# Patient Record
Sex: Male | Born: 1965 | Race: Black or African American | Hispanic: No | Smoking: Former smoker
Health system: Southern US, Community
[De-identification: ages and names within clinical notes are randomized; demographics above are authoritative.]

## PROBLEM LIST (undated history)

## (undated) DIAGNOSIS — F1010 Alcohol abuse, uncomplicated: Secondary | ICD-10-CM

## (undated) DIAGNOSIS — R0602 Shortness of breath: Secondary | ICD-10-CM

## (undated) DIAGNOSIS — F1990 Other psychoactive substance use, unspecified, uncomplicated: Secondary | ICD-10-CM

## (undated) DIAGNOSIS — F419 Anxiety disorder, unspecified: Secondary | ICD-10-CM

## (undated) DIAGNOSIS — M549 Dorsalgia, unspecified: Secondary | ICD-10-CM

## (undated) DIAGNOSIS — A159 Respiratory tuberculosis unspecified: Secondary | ICD-10-CM

## (undated) DIAGNOSIS — R51 Headache: Secondary | ICD-10-CM

## (undated) DIAGNOSIS — E785 Hyperlipidemia, unspecified: Secondary | ICD-10-CM

## (undated) DIAGNOSIS — T8859XA Other complications of anesthesia, initial encounter: Secondary | ICD-10-CM

## (undated) DIAGNOSIS — E7800 Pure hypercholesterolemia, unspecified: Secondary | ICD-10-CM

## (undated) DIAGNOSIS — S86019A Strain of unspecified Achilles tendon, initial encounter: Secondary | ICD-10-CM

## (undated) DIAGNOSIS — T4145XA Adverse effect of unspecified anesthetic, initial encounter: Secondary | ICD-10-CM

## (undated) HISTORY — PX: FRACTURE SURGERY: SHX138

## (undated) HISTORY — DX: Alcohol abuse, uncomplicated: F10.10

## (undated) HISTORY — DX: Pure hypercholesterolemia, unspecified: E78.00

## (undated) HISTORY — DX: Shortness of breath: R06.02

## (undated) HISTORY — DX: Hyperlipidemia, unspecified: E78.5

## (undated) HISTORY — DX: Other psychoactive substance use, unspecified, uncomplicated: F19.90

## (undated) HISTORY — DX: Dorsalgia, unspecified: M54.9

## (undated) HISTORY — DX: Anxiety disorder, unspecified: F41.9

## (undated) HISTORY — DX: Strain of unspecified achilles tendon, initial encounter: S86.019A

---

## 1998-03-16 ENCOUNTER — Encounter: Payer: Self-pay | Admitting: Internal Medicine

## 2005-08-28 ENCOUNTER — Encounter: Payer: Self-pay | Admitting: Internal Medicine

## 2007-01-16 ENCOUNTER — Ambulatory Visit: Payer: Self-pay | Admitting: Internal Medicine

## 2007-01-16 ENCOUNTER — Encounter: Payer: Self-pay | Admitting: Internal Medicine

## 2007-01-16 DIAGNOSIS — F411 Generalized anxiety disorder: Secondary | ICD-10-CM

## 2007-01-16 DIAGNOSIS — E785 Hyperlipidemia, unspecified: Secondary | ICD-10-CM | POA: Insufficient documentation

## 2007-01-16 DIAGNOSIS — A6000 Herpesviral infection of urogenital system, unspecified: Secondary | ICD-10-CM | POA: Insufficient documentation

## 2007-01-16 DIAGNOSIS — F3289 Other specified depressive episodes: Secondary | ICD-10-CM | POA: Insufficient documentation

## 2007-01-16 DIAGNOSIS — J309 Allergic rhinitis, unspecified: Secondary | ICD-10-CM | POA: Insufficient documentation

## 2007-01-16 DIAGNOSIS — F329 Major depressive disorder, single episode, unspecified: Secondary | ICD-10-CM

## 2007-01-16 LAB — CONVERTED CEMR LAB
ALT: 34 units/L (ref 0–53)
AST: 30 units/L (ref 0–37)
Basophils Relative: 1.6 % — ABNORMAL HIGH (ref 0.0–1.0)
Bilirubin, Direct: 0.1 mg/dL (ref 0.0–0.3)
CO2: 35 meq/L — ABNORMAL HIGH (ref 19–32)
Direct LDL: 154.5 mg/dL
Eosinophils Absolute: 0.2 10*3/uL (ref 0.0–0.6)
Eosinophils Relative: 4.1 % (ref 0.0–5.0)
GFR calc Af Amer: 94 mL/min
GFR calc non Af Amer: 78 mL/min
Glucose, Bld: 86 mg/dL (ref 70–99)
HCT: 43.1 % (ref 39.0–52.0)
Hemoglobin: 14.6 g/dL (ref 13.0–17.0)
Leukocytes, UA: NEGATIVE
Lymphocytes Relative: 50.9 % — ABNORMAL HIGH (ref 12.0–46.0)
MCV: 89.2 fL (ref 78.0–100.0)
Monocytes Absolute: 0.4 10*3/uL (ref 0.2–0.7)
Neutro Abs: 2.2 10*3/uL (ref 1.4–7.7)
Neutrophils Relative %: 37.4 % — ABNORMAL LOW (ref 43.0–77.0)
Nitrite: NEGATIVE
Sodium: 144 meq/L (ref 135–145)
Specific Gravity, Urine: 1.015 (ref 1.000–1.03)
Total Protein: 7 g/dL (ref 6.0–8.3)
Triglycerides: 113 mg/dL (ref 0–149)
Urine Glucose: NEGATIVE mg/dL
Urobilinogen, UA: 0.2 (ref 0.0–1.0)
WBC: 6 10*3/uL (ref 4.5–10.5)

## 2008-11-03 ENCOUNTER — Ambulatory Visit: Payer: Self-pay | Admitting: Internal Medicine

## 2008-11-03 ENCOUNTER — Ambulatory Visit (HOSPITAL_BASED_OUTPATIENT_CLINIC_OR_DEPARTMENT_OTHER): Admission: RE | Admit: 2008-11-03 | Discharge: 2008-11-03 | Payer: Self-pay | Admitting: Internal Medicine

## 2008-11-03 ENCOUNTER — Ambulatory Visit: Payer: Self-pay | Admitting: Radiology

## 2008-11-03 DIAGNOSIS — M545 Low back pain: Secondary | ICD-10-CM

## 2008-11-09 ENCOUNTER — Telehealth: Payer: Self-pay | Admitting: Internal Medicine

## 2008-11-15 ENCOUNTER — Encounter: Admission: RE | Admit: 2008-11-15 | Discharge: 2009-02-13 | Payer: Self-pay | Admitting: Internal Medicine

## 2008-11-15 ENCOUNTER — Ambulatory Visit: Payer: Self-pay | Admitting: Internal Medicine

## 2008-11-15 LAB — CONVERTED CEMR LAB
ALT: 15 units/L (ref 0–53)
AST: 15 units/L (ref 0–37)
Albumin: 4.2 g/dL (ref 3.5–5.2)
Alkaline Phosphatase: 48 units/L (ref 39–117)
Basophils Relative: 2 % — ABNORMAL HIGH (ref 0–1)
CO2: 29 meq/L (ref 19–32)
Calcium: 9.9 mg/dL (ref 8.4–10.5)
Cholesterol: 265 mg/dL — ABNORMAL HIGH (ref 0–200)
Creatinine, Ser: 1.04 mg/dL (ref 0.40–1.50)
Eosinophils Absolute: 0.1 10*3/uL (ref 0.0–0.7)
HDL: 61 mg/dL (ref >39)
MCHC: 34.4 g/dL (ref 30.0–36.0)
MCV: 85.9 fL (ref 78.0–100.0)
Monocytes Relative: 9 % (ref 3–12)
Neutro Abs: 1.8 10*3/uL (ref 1.7–7.7)
Neutrophils Relative %: 37 % — ABNORMAL LOW (ref 43–77)
Platelets: 188 10*3/uL (ref 150–400)
RBC: 4.97 M/uL (ref 4.22–5.81)
Sodium: 140 meq/L (ref 135–145)
TSH: 0.893 microintl units/mL (ref 0.350–4.500)
Total CHOL/HDL Ratio: 4.3
Total Protein: 7.3 g/dL (ref 6.0–8.3)
Triglycerides: 78 mg/dL (ref <150)
WBC: 4.8 10*3/uL (ref 4.0–10.5)

## 2008-11-16 ENCOUNTER — Encounter: Payer: Self-pay | Admitting: Internal Medicine

## 2008-12-27 ENCOUNTER — Telehealth: Payer: Self-pay | Admitting: Internal Medicine

## 2009-01-31 ENCOUNTER — Ambulatory Visit: Payer: Self-pay | Admitting: Internal Medicine

## 2009-01-31 LAB — CONVERTED CEMR LAB
ALT: 18 units/L (ref 0–53)
AST: 19 units/L (ref 0–37)
LDL Cholesterol: 171 mg/dL — ABNORMAL HIGH (ref 0–99)
Triglycerides: 100 mg/dL (ref <150)
VLDL: 20 mg/dL (ref 0–40)

## 2009-02-07 ENCOUNTER — Ambulatory Visit: Payer: Self-pay | Admitting: Internal Medicine

## 2009-08-04 ENCOUNTER — Ambulatory Visit: Payer: Self-pay | Admitting: Internal Medicine

## 2009-08-04 LAB — CONVERTED CEMR LAB
ALT: 21 units/L (ref 0–53)
BUN: 14 mg/dL (ref 6–23)
Bilirubin, Direct: 0.1 mg/dL (ref 0.0–0.3)
CO2: 32 meq/L (ref 19–32)
Cholesterol: 228 mg/dL — ABNORMAL HIGH (ref 0–200)
Creatinine, Ser: 1 mg/dL (ref 0.4–1.5)
Direct LDL: 140.6 mg/dL
GFR calc non Af Amer: 107.66 mL/min (ref >60)
HDL: 72.6 mg/dL (ref >39.00)
Total Bilirubin: 0.5 mg/dL (ref 0.3–1.2)
Total CHOL/HDL Ratio: 3
Triglycerides: 101 mg/dL (ref 0.0–149.0)

## 2009-08-17 ENCOUNTER — Ambulatory Visit: Payer: Self-pay | Admitting: Internal Medicine

## 2010-05-01 NOTE — Assessment & Plan Note (Signed)
Summary: CPX- jr rsc with pt/mhf   Vital Signs:  Patient profile:   45 year old male Height:      73 inches Weight:      203 pounds BMI:     26.88 O2 Sat:      99 % on Room air Temp:     98.2 degrees F oral Pulse rate:   55 / minute Pulse rhythm:   regular Resp:     18 per minute BP sitting:   110 / 70  (left arm) Cuff size:   large  Vitals Entered By: Glendell Docker CMA (Aug 17, 2009 10:20 AM)  O2 Flow:  Room air CC: Rm 2- 6 Month Follow up disease management Is Patient Diabetic? No   Primary Care Provider:  DThomos Lemons DO  CC:  Rm 2- 6 Month Follow up disease management.  History of Present Illness: 45 year old American male for routine physical no significant interval history Patient taking pravastatin but intermittently forgets to take in evening he denies side effects from taking statin He is following reasonable low saturated fat diet weight is stable Still working as Chiropractor. professor of dance   Preventive Screening-Counseling & Management  Alcohol-Tobacco     Smoking Status: quit  Allergies (verified): No Known Drug Allergies  Past History:  Past Medical History: genital herpes Allergic rhinitis Hyperlipidemia Anxiety  Depression Hx of + PPD - tx'ed with INH  Past Surgical History: arm surg with PIN 1976   Family History: Family History Hypertension Family History Lung cancer - mother Family History of Stroke M 1st degree relative <50 Family History of CAD Male 1st degree relative <50 AAA - father Father died age 57 Mother died age 36     Social History: Occupation: Professor Corporate treasurer Lives with domestic partner 2 years Former smoker Alcohol use-yes   Review of Systems  The patient denies fever, weight loss, weight gain, chest pain, dyspnea on exertion, prolonged cough, abdominal pain, melena, hematochezia, severe indigestion/heartburn, and depression.    Physical Exam  General:  alert, well-developed, and well-nourished.     Head:  normocephalic and atraumatic.   Eyes:  pupils equal, pupils round, and pupils reactive to light.   Ears:  R ear normal and L ear normal.   Mouth:  Oral mucosa and oropharynx without lesions or exudates.  Teeth in good repair. Neck:  No deformities, masses, or tenderness noted. no carotid bruit Lungs:  normal respiratory effort, normal breath sounds, no crackles, and no wheezes.   Heart:  normal rate, regular rhythm, no murmur, and no gallop.   Abdomen:  soft, non-tender, normal bowel sounds, no masses, no hepatomegaly, and no splenomegaly.   Pulses:  dorsalis pedis and posterior tibial pulses are full and equal bilaterally Extremities:  No lower extremity edema Neurologic:  cranial nerves II-XII intact and gait normal.   Psych:  normally interactive, good eye contact, not anxious appearing, and not depressed appearing.     Impression & Recommendations:  Problem # 1:  PREVENTIVE HEALTH CARE (ICD-V70.0) We discussed low saturated fat diet Patient to work towards 10-15 pound weight loss  Flu Vax: Fluvax Non-MCR (02/07/2009)   Chol: 228 (08/04/2009)   HDL: 72.60 (08/04/2009)   LDL: 171 (01/31/2009)   TG: 101.0 (08/04/2009) TSH: 0.893 (11/15/2008)   HgbA1C: 5.9 (08/04/2009)     Complete Medication List: 1)  Pravastatin Sodium 40 Mg Tabs (Pravastatin sodium) .... One by mouth qpm 2)  Multivitamins Tabs (Multiple vitamin) .... Take 1 tablet  by mouth once a day 3)  Saw Palmetto Xtra Caps (Misc natural products) .... Take 1 capsule by mouth two times a day  Patient Instructions: 1)  Please schedule a follow-up appointment in 1 year. 2)  AST, ALT, FLP:  272.4 3)  Please return for lab work one (1) week before your next appointment.  Prescriptions: PRAVASTATIN SODIUM 40 MG TABS (PRAVASTATIN SODIUM) one by mouth qpm  #90 x 3   Entered and Authorized by:   D. Thomos Lemons DO   Signed by:   D. Thomos Lemons DO on 08/17/2009   Method used:   Electronically to        [facility]. [address]  [phone]* (retail)       [address]       [place], [place]  [address]       Ph: [phone] or [phone]       Fax: [phone]   RxID:   [id number]   Current Allergies (reviewed today): No known allergies

## 2012-04-01 DIAGNOSIS — S86019A Strain of unspecified Achilles tendon, initial encounter: Secondary | ICD-10-CM

## 2012-04-01 HISTORY — DX: Strain of unspecified achilles tendon, initial encounter: S86.019A

## 2013-02-20 ENCOUNTER — Emergency Department (HOSPITAL_COMMUNITY)
Admission: EM | Admit: 2013-02-20 | Discharge: 2013-02-20 | Disposition: A | Discharge status: Home / Self Care | Payer: BC Managed Care – PPO | Attending: Emergency Medicine | Admitting: Emergency Medicine

## 2013-02-20 ENCOUNTER — Encounter (HOSPITAL_COMMUNITY): Payer: Self-pay | Admitting: Emergency Medicine

## 2013-02-20 DIAGNOSIS — X500XXA Overexertion from strenuous movement or load, initial encounter: Secondary | ICD-10-CM | POA: Insufficient documentation

## 2013-02-20 DIAGNOSIS — F17200 Nicotine dependence, unspecified, uncomplicated: Secondary | ICD-10-CM | POA: Insufficient documentation

## 2013-02-20 DIAGNOSIS — S93499A Sprain of other ligament of unspecified ankle, initial encounter: Secondary | ICD-10-CM | POA: Insufficient documentation

## 2013-02-20 DIAGNOSIS — S86012A Strain of left Achilles tendon, initial encounter: Secondary | ICD-10-CM

## 2013-02-20 DIAGNOSIS — Y9341 Activity, dancing: Secondary | ICD-10-CM | POA: Insufficient documentation

## 2013-02-20 DIAGNOSIS — Y929 Unspecified place or not applicable: Secondary | ICD-10-CM | POA: Insufficient documentation

## 2013-02-20 MED ORDER — HYDROCODONE-ACETAMINOPHEN 5-325 MG PO TABS: 1.0000 | ORAL_TABLET | Freq: Four times a day (QID) | ORAL | Status: DC | PRN

## 2013-02-20 NOTE — ED Provider Notes (Signed)
CSN: [id number]     Arrival date & time 02/20/13  1402 History   First MD Initiated Contact with Patient 02/20/13 1419      This chart was scribed for non-physician practitioner, Jaynie Crumble, PA-C, working with Shon Baton, MD by Arlan Organ, ED Scribe. This patient was seen in room WTR9/WTR9 and the patient's care was started at 2:37 PM.   Chief Complaint  Patient presents with  . Ankle Pain    left    The history is provided by the patient. No language interpreter was used.   HPI Comments: Ray Case is a 47 y.o. male who presents to the Emergency Department complaining of sudden onset, unchanged, constant left posterior ankle pain that started about 1 hour prior to arrival. 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 He describes the pain as "dull" and "achy", and states movement does not worsen the pain. Pt denies fever.  History reviewed. No pertinent past medical history. History reviewed. No pertinent past surgical history. No family history on file. History  Substance Use Topics  . Smoking status: Current Every Day Smoker    Types: Cigarettes  . Smokeless tobacco: Never Used  . Alcohol Use: 2.4 oz/week    2 Glasses of wine, 2 Shots of liquor per week     Comment: daily wine and occasional iquor    Review of Systems  Constitutional: Negative for fever.  Musculoskeletal: Positive for arthralgias (ankle pain).  All other systems reviewed and are negative.    Allergies  Review of patient's allergies indicates no known allergies.  Home Medications  No current outpatient prescriptions on file.  Triage Vitals: BP 122/80  Pulse 88  Temp(Src) 98.3 F (36.8 C) (Oral)  Resp 17  SpO2 98%  Physical Exam  Nursing note and vitals reviewed. Constitutional: He is oriented to person, place, and time. He appears well-developed and well-nourished.  HENT:  Head: Normocephalic and  atraumatic.  Eyes: EOM are normal.  Neck: Normal range of motion.  Cardiovascular: Normal rate.   Pulmonary/Chest: Effort normal.  Musculoskeletal: Normal range of motion.  Left achilles deformity. Positive thompson's test. Pt is able to dorsiflex and plantar flex foot. Normal foot exam. No tenderness over bilateral malleoli. No foot bony tenderness.   Neurological: He is alert and oriented to person, place, and time.  Skin: Skin is warm and dry.  Psychiatric: He has a normal mood and affect. His behavior is normal.    ED Course  Procedures (including critical care time)  DIAGNOSTIC STUDIES: Oxygen Saturation is 98% on RA, Normal by my interpretation.    COORDINATION OF CARE: 2:38 PM- Will order splint placement. Discussed treatment plan with pt at bedside and pt agreed to plan.     Labs Review Labs Reviewed - No data to display Imaging Review No results found.  EKG Interpretation   None       MDM   1. Ruptured Achilles tendon due to trauma, left, initial encounter     Pt with clinical rupture of the left Achilles tendon. No bony tenderness or other findings on the exam. Do not think any imaging is necessary on an emergent basis. Patient's foot was splinted in posterior splint by an orthopedics tech and he was provided with crutches. He will need to followup with an orthopedic specialist for further treatment and possible surgical repair.  Filed Vitals:   02/20/13 1412  02/20/13 1525  BP: 122/80 127/81  Pulse: 88   Temp: 98.3 F (36.8 C)   TempSrc: Oral   Resp: 17   SpO2: 98%      I personally performed the services described in this documentation, which was scribed in my presence. The recorded information has been reviewed and is accurate.    Lottie Mussel, PA-C 02/20/13 1612  Lottie Mussel, PA-C 02/20/13 1613

## 2013-02-20 NOTE — ED Notes (Signed)
Pt states he was doing a dance move @ 1 hour PTA and he heard a pop in his left posterior ankle.  Pt states, "It felt like someone kicked me."  Pt states he now has a dull ache in left posterior ankle.  Pt is able to dorsiflex and point his foot.  Pt states movement does not make pain worse.  Pt states left foot feels "wobbly."  Pt's foot is warm and dry.

## 2013-02-20 NOTE — ED Provider Notes (Signed)
Medical screening examination/treatment/procedure(s) were performed by non-physician practitioner and as supervising physician I was immediately available for consultation/collaboration.  EKG Interpretation   None         Shon Baton, MD 02/20/13 [phone]

## 2013-02-22 ENCOUNTER — Other Ambulatory Visit (HOSPITAL_COMMUNITY): Payer: Self-pay | Admitting: Orthopaedic Surgery

## 2013-02-23 ENCOUNTER — Telehealth: Payer: Self-pay | Admitting: Internal Medicine

## 2013-02-23 ENCOUNTER — Encounter (HOSPITAL_COMMUNITY): Payer: Self-pay | Admitting: *Deleted

## 2013-02-23 MED ORDER — CEFAZOLIN SODIUM-DEXTROSE 2-3 GM-% IV SOLR
2.0000 g | INTRAVENOUS | Status: AC
  Administered 2013-02-24: 2 g via INTRAVENOUS
  Filled 2013-02-23: qty 50

## 2013-02-23 NOTE — Telephone Encounter (Signed)
Pt has not seen MD in over 3 yrs. Pt would like to re-est  Can I sch?

## 2013-02-23 NOTE — Telephone Encounter (Signed)
Ok per Dr Yoo 

## 2013-02-23 NOTE — Telephone Encounter (Signed)
PT HAS BEEN SCH

## 2013-02-24 ENCOUNTER — Ambulatory Visit (HOSPITAL_COMMUNITY)
Admission: RE | Admit: 2013-02-24 | Discharge: 2013-02-24 | Disposition: A | Discharge status: Home / Self Care | Payer: BC Managed Care – PPO | Source: Ambulatory Visit | Attending: Orthopaedic Surgery | Admitting: Orthopaedic Surgery

## 2013-02-24 ENCOUNTER — Encounter (HOSPITAL_COMMUNITY): Payer: BC Managed Care – PPO | Admitting: Certified Registered Nurse Anesthetist

## 2013-02-24 ENCOUNTER — Encounter (HOSPITAL_COMMUNITY)
Admission: RE | Disposition: A | Discharge status: Home / Self Care | Payer: Self-pay | Source: Ambulatory Visit | Attending: Orthopaedic Surgery

## 2013-02-24 ENCOUNTER — Ambulatory Visit (HOSPITAL_COMMUNITY): Payer: BC Managed Care – PPO | Admitting: Certified Registered Nurse Anesthetist

## 2013-02-24 ENCOUNTER — Encounter (HOSPITAL_COMMUNITY): Payer: Self-pay | Admitting: *Deleted

## 2013-02-24 DIAGNOSIS — I10 Essential (primary) hypertension: Secondary | ICD-10-CM | POA: Insufficient documentation

## 2013-02-24 DIAGNOSIS — X58XXXA Exposure to other specified factors, initial encounter: Secondary | ICD-10-CM | POA: Insufficient documentation

## 2013-02-24 DIAGNOSIS — S93499A Sprain of other ligament of unspecified ankle, initial encounter: Secondary | ICD-10-CM | POA: Insufficient documentation

## 2013-02-24 DIAGNOSIS — S86019A Strain of unspecified Achilles tendon, initial encounter: Secondary | ICD-10-CM

## 2013-02-24 DIAGNOSIS — Y9341 Activity, dancing: Secondary | ICD-10-CM | POA: Insufficient documentation

## 2013-02-24 DIAGNOSIS — S86012A Strain of left Achilles tendon, initial encounter: Secondary | ICD-10-CM

## 2013-02-24 DIAGNOSIS — F17200 Nicotine dependence, unspecified, uncomplicated: Secondary | ICD-10-CM | POA: Insufficient documentation

## 2013-02-24 HISTORY — DX: Other complications of anesthesia, initial encounter: T88.59XA

## 2013-02-24 HISTORY — DX: Respiratory tuberculosis unspecified: A15.9

## 2013-02-24 HISTORY — DX: Headache: R51

## 2013-02-24 HISTORY — PX: ACHILLES TENDON SURGERY: SHX542

## 2013-02-24 HISTORY — DX: Adverse effect of unspecified anesthetic, initial encounter: T41.45XA

## 2013-02-24 LAB — CBC
HCT: 43.9 % (ref 39.0–52.0)
MCHC: 34.4 g/dL (ref 30.0–36.0)
MCV: 89.8 fL (ref 78.0–100.0)
Platelets: 175 10*3/uL (ref 150–400)
RDW: 13.8 % (ref 11.5–15.5)

## 2013-02-24 SURGERY — REPAIR, TENDON, ACHILLES
Anesthesia: General | Site: Ankle | Laterality: Left | Wound class: Clean

## 2013-02-24 MED ORDER — OXYCODONE HCL 5 MG PO TABS: 5.0000 mg | ORAL_TABLET | ORAL | Status: DC | PRN

## 2013-02-24 MED ORDER — FENTANYL CITRATE 0.05 MG/ML IJ SOLN
INTRAMUSCULAR | Status: AC
  Administered 2013-02-24: 100 ug
  Filled 2013-02-24: qty 2

## 2013-02-24 MED ORDER — LIDOCAINE HCL 4 % MT SOLN
OROMUCOSAL | Status: DC | PRN
  Administered 2013-02-24: 4 mL via TOPICAL

## 2013-02-24 MED ORDER — ONDANSETRON HCL 4 MG/2ML IJ SOLN
INTRAMUSCULAR | Status: DC | PRN
  Administered 2013-02-24: 4 mg via INTRAVENOUS

## 2013-02-24 MED ORDER — GLYCOPYRROLATE 0.2 MG/ML IJ SOLN
INTRAMUSCULAR | Status: DC | PRN
  Administered 2013-02-24: .4 mg via INTRAVENOUS
  Administered 2013-02-24: .2 mg via INTRAVENOUS

## 2013-02-24 MED ORDER — NEOSTIGMINE METHYLSULFATE 1 MG/ML IJ SOLN
INTRAMUSCULAR | Status: DC | PRN
  Administered 2013-02-24: 3 mg via INTRAVENOUS

## 2013-02-24 MED ORDER — 0.9 % SODIUM CHLORIDE (POUR BTL) OPTIME
TOPICAL | Status: DC | PRN
  Administered 2013-02-24: 1000 mL

## 2013-02-24 MED ORDER — FENTANYL CITRATE 0.05 MG/ML IJ SOLN
INTRAMUSCULAR | Status: DC | PRN
  Administered 2013-02-24: 50 ug via INTRAVENOUS
  Administered 2013-02-24 (×2): 100 ug via INTRAVENOUS

## 2013-02-24 MED ORDER — ASPIRIN EC 325 MG PO TBEC: 325.0000 mg | DELAYED_RELEASE_TABLET | Freq: Every day | ORAL | Status: DC

## 2013-02-24 MED ORDER — HYDROMORPHONE HCL PF 1 MG/ML IJ SOLN
0.2500 mg | INTRAMUSCULAR | Status: DC | PRN
Start: 2013-02-24 — End: 2013-02-24

## 2013-02-24 MED ORDER — SUCCINYLCHOLINE CHLORIDE 20 MG/ML IJ SOLN
INTRAMUSCULAR | Status: DC | PRN
  Administered 2013-02-24: 100 mg via INTRAVENOUS

## 2013-02-24 MED ORDER — ARTIFICIAL TEARS OP OINT
TOPICAL_OINTMENT | OPHTHALMIC | Status: DC | PRN
  Administered 2013-02-24: 1 via OPHTHALMIC

## 2013-02-24 MED ORDER — LACTATED RINGERS IV SOLN
INTRAVENOUS | Status: DC | PRN
  Administered 2013-02-24 (×2): via INTRAVENOUS

## 2013-02-24 MED ORDER — LACTATED RINGERS IV SOLN
INTRAVENOUS | Status: DC
  Administered 2013-02-24: 09:00:00 via INTRAVENOUS

## 2013-02-24 MED ORDER — ROCURONIUM BROMIDE 100 MG/10ML IV SOLN
INTRAVENOUS | Status: DC | PRN
  Administered 2013-02-24: 35 mg via INTRAVENOUS

## 2013-02-24 MED ORDER — ONDANSETRON HCL 4 MG/2ML IJ SOLN: 4.0000 mg | Freq: Once | INTRAMUSCULAR | Status: DC | PRN

## 2013-02-24 MED ORDER — PROPOFOL 10 MG/ML IV BOLUS
INTRAVENOUS | Status: DC | PRN
  Administered 2013-02-24: 200 mg via INTRAVENOUS

## 2013-02-24 SURGICAL SUPPLY — 40 items
BLADE SURG 10 STRL SS (BLADE) IMPLANT
BNDG COHESIVE 6X5 TAN STRL LF (GAUZE/BANDAGES/DRESSINGS) IMPLANT
BNDG ESMARK 4X9 LF (GAUZE/BANDAGES/DRESSINGS) ×2 IMPLANT
BNDG GAUZE STRTCH 6 (GAUZE/BANDAGES/DRESSINGS) ×2 IMPLANT
CLOTH BEACON ORANGE TIMEOUT ST (SAFETY) IMPLANT
COTTON STERILE ROLL (GAUZE/BANDAGES/DRESSINGS) IMPLANT
CUFF TOURNIQUET SINGLE 34IN LL (TOURNIQUET CUFF) ×2 IMPLANT
CUFF TOURNIQUET SINGLE 44IN (TOURNIQUET CUFF) IMPLANT
DRAPE INCISE IOBAN 66X45 STRL (DRAPES) IMPLANT
DRAPE U-SHAPE 47X51 STRL (DRAPES) ×2 IMPLANT
DRSG ADAPTIC 3X8 NADH LF (GAUZE/BANDAGES/DRESSINGS) ×2 IMPLANT
DURAPREP 26ML APPLICATOR (WOUND CARE) ×2 IMPLANT
ELECT REM PT RETURN 9FT ADLT (ELECTROSURGICAL) ×2
ELECTRODE REM PT RTRN 9FT ADLT (ELECTROSURGICAL) ×1 IMPLANT
GOWN PREVENTION PLUS XLARGE (GOWN DISPOSABLE) ×2 IMPLANT
GOWN SRG XL XLNG 56XLVL 4 (GOWN DISPOSABLE) ×2 IMPLANT
GOWN STRL NON-REIN XL XLG LVL4 (GOWN DISPOSABLE) ×2
KIT IMPLANT SUTURE PARS (Kit) ×2 IMPLANT
KIT ROOM TURNOVER OR (KITS) ×2 IMPLANT
MANIFOLD NEPTUNE II (INSTRUMENTS) ×2 IMPLANT
NDL SUT .5 MAYO 1.404X.05X (NEEDLE) ×1 IMPLANT
NEEDLE MAYO TAPER (NEEDLE) ×1
NS IRRIG 1000ML POUR BTL (IV SOLUTION) ×2 IMPLANT
PACK ORTHO EXTREMITY (CUSTOM PROCEDURE TRAY) ×2 IMPLANT
PAD ARMBOARD 7.5X6 YLW CONV (MISCELLANEOUS) ×4 IMPLANT
SPLINT FIBERGLASS 4X30 (CAST SUPPLIES) ×2 IMPLANT
SPONGE GAUZE 4X4 12PLY (GAUZE/BANDAGES/DRESSINGS) ×2 IMPLANT
SPONGE LAP 4X18 X RAY DECT (DISPOSABLE) ×2 IMPLANT
SUT ETHILON 3 0 FSLX (SUTURE) IMPLANT
SUT FIBERWIRE #2 38 T-5 BLUE (SUTURE)
SUT MNCRL AB 3-0 PS2 18 (SUTURE) IMPLANT
SUT MNCRL AB 4-0 PS2 18 (SUTURE) ×2 IMPLANT
SUT VIC AB 2-0 CT1 27 (SUTURE) ×1
SUT VIC AB 2-0 CT1 TAPERPNT 27 (SUTURE) ×1 IMPLANT
SUTURE FIBERWR #2 38 T-5 BLUE (SUTURE) IMPLANT
TOWEL OR 17X24 6PK STRL BLUE (TOWEL DISPOSABLE) ×2 IMPLANT
TOWEL OR 17X26 10 PK STRL BLUE (TOWEL DISPOSABLE) ×2 IMPLANT
TUBE CONNECTING 12X1/4 (SUCTIONS) ×2 IMPLANT
WATER STERILE IRR 1000ML POUR (IV SOLUTION) ×2 IMPLANT
YANKAUER SUCT BULB TIP NO VENT (SUCTIONS) ×2 IMPLANT

## 2013-02-24 NOTE — Op Note (Signed)
Date of surgery: 02/24/2013  Preoperative diagnosis: Left Achilles tendon rupture  Postoperative diagnosis: Same  Procedure: Mini open repair left Achilles tendon rupture  Surgeon: Glee Arvin, MD  Anesthesia: Gen.  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  He presented to my clinic today complete rupture of his left Achilles tendon. The risks, benefits, and alternatives to surgery were discussed and the patient agreed to proceed with her and  Description of procedure: The patient was identified in the preoperative holding area. The operative site and procedure were confirmed with the patient and marked by the surgeon. He is brought back to the operating room. General anesthesia was induced by the anesthesiologist. He was placed prone on the operating room table. A well-padded nonsterile tourniquet was placed on the upper thigh. The left lower extremity was prepped and draped in standard sterile fashion. Incisional antibiotics were given prior to incision. A timeout was performed. The extremity was exsanguinated with the use of Esmarch bandage. The tourniquet was inflated to 300 mm Hg.  A 2 cm longitudinal incision based over the possible defect in the Achilles tendon was used. Blunt dissection was taken down to the level of the peritenon. There were no cutaneous nerves that were encountered. The peritenon was elevated off of the Achilles tendon. A complete tear of the Achilles tendon was observed. With the use of the percutaneous jig system the jig was advanced up the sides of the proximal Achilles stump. Care was taken to stay in between the peritenon and the tendon. Sutures were passed percutaneously. We then duplicated this for the distal end of the stump. Sutures were brought out through the wound. The ankle was placed in  maximum plantarflexion. The sutures were tied. The ends of the ruptured tendon were visualized to be opposed. The wound was then irrigated thoroughly with normal saline. The tourniquet was inflated and hemostasis was obtained and the wound was closed in layer fashion with 0 Vicryl, 2-0 Vicryl and then 3-0 nylon for the skin. The foot was placed in a short-leg splint in maximum plantar flexion. The patient awoke from anesthesia uneventfully and transferred to the PACU.  Disposition: The patient will be nonweightbearing to the left lower extremity. He will followup with me in the clinic in 2 weeks. He will be on aspirin for DVT prophylaxis.  Mayra Reel, MD [facility] [phone] 4:46 PM

## 2013-02-24 NOTE — Transfer of Care (Deleted)
Immediate Anesthesia Transfer of Care Note  Patient: Ray Case  Procedure(s) Performed: Procedure(s): LEFT ACHILLES TENDON REPAIR (Left)  Patient Location: PACU  Anesthesia Type:General  Level of Consciousness: awake, alert , oriented and patient cooperative  Airway & Oxygen Therapy: Patient Spontanous Breathing and Patient connected to nasal cannula oxygen  Post-op Assessment: Report given to PACU RN and Post -op Vital signs reviewed and stable  Post vital signs: Reviewed and stable  Complications: No apparent anesthesia complications

## 2013-02-24 NOTE — Anesthesia Preprocedure Evaluation (Addendum)
Anesthesia Evaluation  Patient identified by MRN, date of birth, ID band Patient awake    Reviewed: Allergy & Precautions, H&P , NPO status , Patient's Chart, lab work & pertinent test results, reviewed documented beta blocker date and time   History of Anesthesia Complications Negative for: history of anesthetic complications  Airway Mallampati: I TM Distance: >3 FB Neck ROM: Full    Dental  (+) Teeth Intact and Dental Advisory Given   Pulmonary Current Smoker,  breath sounds clear to auscultation  Pulmonary exam normal       Cardiovascular Exercise Tolerance: Good Rhythm:Regular Rate:Normal     Neuro/Psych  Headaches,    GI/Hepatic negative GI ROS, Neg liver ROS,   Endo/Other  negative endocrine ROS  Renal/GU negative Renal ROS     Musculoskeletal negative musculoskeletal ROS (+)   Abdominal   Peds negative pediatric ROS (+)  Hematology negative hematology ROS (+)   Anesthesia Other Findings   Reproductive/Obstetrics                         Anesthesia Physical Anesthesia Plan  ASA: I  Anesthesia Plan: General and Regional   Post-op Pain Management:    Induction: Intravenous  Airway Management Planned: Oral ETT  Additional Equipment:   Intra-op Plan:   Post-operative Plan: Extubation in OR  Informed Consent: I have reviewed the patients History and Physical, chart, labs and discussed the procedure including the risks, benefits and alternatives for the proposed anesthesia with the patient or authorized representative who has indicated his/her understanding and acceptance.   Dental advisory given  Plan Discussed with:   Anesthesia Plan Comments:         Anesthesia Quick Evaluation

## 2013-02-24 NOTE — Anesthesia Postprocedure Evaluation (Signed)
  Anesthesia Post-op Note  Patient: Ray Case  Procedure(s) Performed: Procedure(s): LEFT ACHILLES TENDON REPAIR (Left)  Patient Location: PACU  Anesthesia Type:GA combined with regional for post-op pain  Level of Consciousness: awake, alert , oriented and patient cooperative  Airway and Oxygen Therapy: Patient Spontanous Breathing  Post-op Pain: none  Post-op Assessment: Post-op Vital signs reviewed, Patient's Cardiovascular Status Stable, Respiratory Function Stable, Patent Airway, No signs of Nausea or vomiting and Pain level controlled  Post-op Vital Signs: stable  Complications: No apparent anesthesia complications

## 2013-02-24 NOTE — Anesthesia Procedure Notes (Addendum)
Anesthesia Regional Block:   Narrative:    Anesthesia Regional Block:  Popliteal block  Pre-Anesthetic Checklist: ,, timeout performed, Correct Patient, Correct Site, Correct Laterality, Correct Procedure, Correct Position, site marked, Risks and benefits discussed,  Surgical consent,  Pre-op evaluation,  At surgeon's request and post-op pain management  Laterality: Left  Prep: chloraprep and alcohol swabs       Needles:  Injection technique: Single-shot  Needle Type: Stimulator Needle - 80        Needle insertion depth: 6 cm   Additional Needles:  Procedures: nerve stimulator Popliteal block  Nerve Stimulator or Paresthesia:  Response: 0.5 mA, 0.1 ms, 6 cm  Additional Responses:   Narrative:  Start time: 02/24/2013 10:40 AM End time: 02/24/2013 10:47 AM Injection made incrementally with aspirations every 5 mL.  Performed by: Personally  Anesthesiologist: Maren Beach MD  Additional Notes: Pt accepts procedure w/ risks. 20 cc 0.5% Marcaine w/ epi w/o difficulty w/ mild discomfort. GES   Procedure Name: Intubation Date/Time: 02/24/2013 11:11 AM Performed by: Angelica Pou Pre-anesthesia Checklist: Patient identified, Patient being monitored, Timeout performed, Emergency Drugs available and Suction available Patient Re-evaluated:Patient Re-evaluated prior to inductionOxygen Delivery Method: Circle system utilized Preoxygenation: Pre-oxygenation with 100% oxygen Intubation Type: IV induction Ventilation: Mask ventilation without difficulty Laryngoscope Size: Mac and 3 Grade View: Grade I Tube type: Oral Tube size: 7.5 mm Number of attempts: 1 Airway Equipment and Method: Stylet Placement Confirmation: ETT inserted through vocal cords under direct vision,  breath sounds checked- equal and bilateral and positive ETCO2 Secured at: 22 cm Tube secured with: Tape Dental Injury: Teeth and Oropharynx as per pre-operative assessment  Comments: Smooth  IV induction by Dr Katrinka Blazing; easy atraumatic intubation by Pasty Arch CRNA.

## 2013-02-24 NOTE — H&P (Signed)
PREOPERATIVE H&P  Chief Complaint: Left achilles rupture  HPI: Ray Case is a 47 y.o. male who presents surgical treatment of achilles tendon rupture.  R/b/a were discussed and the patient elects to proceed.   Past Medical History  Diagnosis Date  . Complication of anesthesia     Pt had previouds experience of feeling pain after local anesthesia  . Headache(784.0)   . 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 Fracture surgery      Right arm   History   Social History  . Marital Status: Single    Spouse Name: N/A    Number of Children: N/A  . Years of Education: N/A   Social History Main Topics  . Smoking status: Current Every Day Smoker -- 0.50 packs/day    Types: Cigarettes  . Smokeless tobacco: Never Used  . Alcohol Use: 2.4 oz/week    2 Glasses of wine, 2 Shots of liquor per week     Comment: daily wine and occasional iquor  . Drug Use: Yes    Special: Marijuana  . Sexual Activity: None   Other Topics Concern  . None   Social History Narrative  . None   Family History  Problem Relation Age of Onset  . Cancer - Lung Mother   . Hypertension Father   . Diverticulosis Sister   . Schizophrenia Other    No Known Allergies Prior to Admission medications   Medication Sig Start Date End Date Taking? Authorizing Provider  HYDROcodone-acetaminophen (NORCO) 5-325 MG per tablet Take 1 tablet by mouth every 6 (six) hours as needed. 02/20/13  Yes Tatyana A Kirichenko, PA-C  ibuprofen (ADVIL,MOTRIN) 200 MG tablet Take 400 mg by mouth every 6 (six) hours as needed for moderate pain.   Yes Historical Provider, MD     Positive ROS: All other systems have been reviewed and were otherwise negative with the exception of those mentioned in the HPI and as above.  Physical Exam: General: Alert, no acute distress Cardiovascular: No pedal edema Respiratory: No cyanosis, no use of accessory musculature GI: No  organomegaly, abdomen is soft and non-tender Skin: No lesions in the area of chief complaint Neurologic: Sensation intact distally Psychiatric: Patient is competent for consent with normal mood and affect Lymphatic: No axillary or cervical lymphadenopathy  MUSCULOSKELETAL:  LLE: abnormal thompson's test. Palpable defect approximately 6 cm up from insertion of achilles. Foot nvi.  Assessment: Left achilles rupture  Plan: Plan for Procedure(s): LEFT ACHILLES TENDON REPAIR  The risks benefits and alternatives were discussed with the patient including but not limited to the risks of nonoperative treatment, versus surgical intervention including infection, bleeding, nerve injury,  blood clots, cardiopulmonary complications, morbidity, mortality, among others, and they were willing to proceed.   Cheral Almas, MD   02/24/2013 10:46 AM

## 2013-02-24 NOTE — Preoperative (Signed)
Beta Blockers   Reason not to administer Beta Blockers:Not Applicable 

## 2013-02-24 NOTE — Transfer of Care (Signed)
Immediate Anesthesia Transfer of Care Note  Patient: Ray Case  Procedure(s) Performed: Procedure(s): LEFT ACHILLES TENDON REPAIR (Left)  Patient Location: PACU  Anesthesia Type:General  Level of Consciousness: awake, alert , oriented and patient cooperative  Airway & Oxygen Therapy: Patient Spontanous Breathing and Patient connected to nasal cannula oxygen  Post-op Assessment: Report given to PACU RN and Post -op Vital signs reviewed and stable  Post vital signs: Reviewed and stable  Complications: No apparent anesthesia complications 

## 2013-02-24 NOTE — Brief Op Note (Signed)
   Brief Op Note  Date of Surgery: 02/24/2013  Preoperative Diagnosis: Left achilles tendon rupture  Postoperative Diagnosis: same  Procedure: Procedure(s): LEFT ACHILLES TENDON REPAIR  Implants: none  Surgeons: Surgeon(s): Naiping Glee Arvin, MD  Anesthesia: General  Drains: none  Estimated Blood Loss: minimal  Complications: None  Condition to PACU: Stable  Naiping Glee Arvin, MD [facility] 02/24/2013 12:31 PM

## 2013-03-01 ENCOUNTER — Encounter (HOSPITAL_COMMUNITY): Payer: Self-pay | Admitting: Orthopaedic Surgery

## 2013-03-31 ENCOUNTER — Ambulatory Visit (INDEPENDENT_AMBULATORY_CARE_PROVIDER_SITE_OTHER): Payer: BC Managed Care – PPO | Admitting: Internal Medicine

## 2013-03-31 ENCOUNTER — Encounter: Payer: Self-pay | Admitting: Internal Medicine

## 2013-03-31 VITALS — BP 132/100 | HR 100 | Temp 99.0°F | Ht 73.0 in | Wt 189.0 lb

## 2013-03-31 DIAGNOSIS — E785 Hyperlipidemia, unspecified: Secondary | ICD-10-CM

## 2013-03-31 DIAGNOSIS — A6000 Herpesviral infection of urogenital system, unspecified: Secondary | ICD-10-CM

## 2013-03-31 DIAGNOSIS — R030 Elevated blood-pressure reading, without diagnosis of hypertension: Secondary | ICD-10-CM

## 2013-03-31 DIAGNOSIS — R195 Other fecal abnormalities: Secondary | ICD-10-CM

## 2013-03-31 DIAGNOSIS — S86012S Strain of left Achilles tendon, sequela: Secondary | ICD-10-CM

## 2013-03-31 DIAGNOSIS — IMO0002 Reserved for concepts with insufficient information to code with codable children: Secondary | ICD-10-CM

## 2013-03-31 NOTE — Progress Notes (Signed)
Subjective:    Patient ID: Ray Case, male    DOB: [birth year], 47 y.o.   MRN: [id number]  HPI  47 year old African American male with history of hyperlipidemia and tobacco use to reestablish.  Patient last seen 2011. Interval medical history-patient suffered Achilles tendon rupture in November of 2014. Patient works at a [employer] as a Pharmacist, community. Patient successfully underwent Achilles tendon repair. He is currently in rehabilitation.  History of hyperlipidemia-patient previously described pravastatin but he did not take a regular basis. Patient reports making some dietary changes and was exercising prior to his Achilles tendon injury.  She has history of tobacco use. He smoked anywhere from 3-10 cigarettes per day. After his Achilles tendon surgery, patient reconsidered his health habits and discontinue smoking and stopped his alcohol and recreational drug use.  Patient experienced postop constipation after using narcotics for postop pain. Patient reports his bowel movements have improved however he has noticed bowel habit changes in general. He occasionally experiences a sense in complete evacuation.  He denies melena or hematochezia.    Review of Systems  Constitutional: Negative for activity change, appetite change and unexpected weight change.  Eyes: Negative for visual disturbance.  Respiratory: Negative for cough, chest tightness and shortness of breath.   Cardiovascular: Negative for chest pain.  Genitourinary: Intermittent weak urinary stream Neurological: Negative for headaches.  Gastrointestinal: Negative for abdominal pain, heartburn melena or hematochezia Psych: Negative for depression or anxiety Endo:  No polyuria or polydypsia Sexual:  Patient reports protected sexual intercourse with partner with known HIV        Past Medical History  Diagnosis Date  . Complication of anesthesia     Pt had previouds experience of feeling pain after local  anesthesia  . Headache(784.0)   . Tuberculosis     Pt exposed and medicated  1990'a  . Hyperlipidemia     History   Social History  . Marital Status: Single    Spouse Name: N/A    Number of Children: N/A  . Years of Education: N/A   Occupational History  . Not on file.   Social History Main Topics  . Smoking status: Former Smoker -- 0.25 packs/day for 8 years    Types: Cigarettes  . Smokeless tobacco: Never Used  . Alcohol Use: No     Comment: Prior to his Achilles tendon surgery patient was drinking 2-3 alcoholic beverages per day. He has discontinued alcohol use  . Drug Use: No     Comment: Patient reports discontinuing marijuana use after his Achilles tendon surgery  . Sexual Activity: Not on file   Other Topics Concern  . Not on file   Social History Narrative  . No narrative on file    Past Surgical History  Procedure Laterality Date  . Fracture surgery      Right arm  . Achilles tendon surgery Left 02/24/2013    Procedure: LEFT ACHILLES TENDON REPAIR;  Surgeon: Cheral Almas, MD;  Location: [facility] OR;  Service: Orthopedics;  Laterality: Left;    Family History  Problem Relation Age of Onset  . Cancer - Lung Mother     Mother died of lung cancer at age 28 (smoker)  . Hypertension Father     Father died in his 20s secondary to aortic aneurysm. He has history of stroke and was a smoker  . Diverticulosis Sister   . Schizophrenia Other   . Obesity Sister     Sister died at age 30 secondary  to complications of morbid obesity    No Known Allergies  Current Outpatient Prescriptions on File Prior to Visit  Medication Sig Dispense Refill  . aspirin EC 325 MG tablet Take 1 tablet (325 mg total) by mouth daily.  84 tablet  0  . HYDROcodone-acetaminophen (NORCO) 5-325 MG per tablet Take 1 tablet by mouth every 6 (six) hours as needed.  20 tablet  0   No current facility-administered medications on file prior to visit.    BP 132/100  Pulse 100  Temp(Src) 99  F (37.2 C) (Oral)  Ht 6\' 1"  (1.854 m)  Wt 189 lb (85.73 kg)  BMI 24.94 kg/m2    Objective:   Physical Exam  Constitutional: He is oriented to person, place, and time. He appears well-developed and well-nourished. No distress.  HENT:  Head: Normocephalic and atraumatic.  Right Ear: External ear normal.  Left Ear: External ear normal.  Mouth/Throat: Oropharynx is clear and moist. No oropharyngeal exudate.  Eyes: Conjunctivae and EOM are normal. Pupils are equal, round, and reactive to light. No scleral icterus.  Neck: Normal range of motion. Neck supple.  No carotid bruit  Cardiovascular: Normal rate, regular rhythm, normal heart sounds and intact distal pulses.   No murmur heard. Pulmonary/Chest: Effort normal and breath sounds normal. He has no rales.  Abdominal: Soft. Bowel sounds are normal. He exhibits no distension and no mass. There is no tenderness.  Genitourinary: Guaiac positive stool.  Slightly enlarged prostate. Unable to completely palpate prostate gland. No asymmetry or nodules noted. No rectal masses.  Musculoskeletal: He exhibits no edema.  Lymphadenopathy:    He has no cervical adenopathy.  Neurological: He is alert and oriented to person, place, and time. No cranial nerve deficit.  Skin: Skin is warm and dry.  Psychiatric: He has a normal mood and affect. His behavior is normal.          Assessment & Plan:

## 2013-03-31 NOTE — Assessment & Plan Note (Signed)
Patient advised to reduce his sodium intake. His ability exercise limited by Achilles tendon injury. Reassess in one month. BP: 132/100 mmHg

## 2013-03-31 NOTE — Assessment & Plan Note (Signed)
Continue rehabilitation as per his orthopedic specialist-Dr. Roda Shutters.

## 2013-03-31 NOTE — Assessment & Plan Note (Signed)
Patient previously prescribed Pravastatin 2-3 years ago. Patient never started medication. He reports making dietary changes. Reassess cardiovascular risk. Repeat fasting lipid panel. Obtain high sensitivity CRP.  Fortunately patient has discontinued tobacco use alcohol use.

## 2013-03-31 NOTE — Patient Instructions (Signed)
Complete blood work before your next office visit. Our office will contact you re: referral to gastroenterologist

## 2013-03-31 NOTE — Assessment & Plan Note (Addendum)
Patient reports changes in bowel habits. He is 47 years old. Rectal exam is negative for rectal masses but he has guaiac positive stools. Refer to gastroenterology for further evaluation/colonoscopy.

## 2013-03-31 NOTE — Progress Notes (Signed)
Pre visit review using our clinic review tool, if applicable. No additional management support is needed unless otherwise documented below in the visit note. 

## 2013-04-09 ENCOUNTER — Other Ambulatory Visit (INDEPENDENT_AMBULATORY_CARE_PROVIDER_SITE_OTHER): Payer: BC Managed Care – PPO

## 2013-04-09 DIAGNOSIS — A6000 Herpesviral infection of urogenital system, unspecified: Secondary | ICD-10-CM

## 2013-04-09 DIAGNOSIS — E785 Hyperlipidemia, unspecified: Secondary | ICD-10-CM

## 2013-04-09 LAB — LIPID PANEL
CHOL/HDL RATIO: 5
Cholesterol: 275 mg/dL — ABNORMAL HIGH (ref 0–200)
HDL: 57.5 mg/dL (ref >39.00)
Triglycerides: 84 mg/dL (ref 0.0–149.0)
VLDL: 16.8 mg/dL (ref 0.0–40.0)

## 2013-04-09 LAB — BASIC METABOLIC PANEL
BUN: 14 mg/dL (ref 6–23)
CO2: 30 mEq/L (ref 19–32)
Calcium: 9.9 mg/dL (ref 8.4–10.5)
Chloride: 105 mEq/L (ref 96–112)
Creatinine, Ser: 1 mg/dL (ref 0.4–1.5)
GFR: 98.86 mL/min (ref >60.00)
GLUCOSE: 89 mg/dL (ref 70–99)
Potassium: 4.6 mEq/L (ref 3.5–5.1)
SODIUM: 143 meq/L (ref 135–145)

## 2013-04-09 LAB — HEPATIC FUNCTION PANEL
ALT: 28 U/L (ref 0–53)
AST: 21 U/L (ref 0–37)
Albumin: 4.1 g/dL (ref 3.5–5.2)
Alkaline Phosphatase: 52 U/L (ref 39–117)
Bilirubin, Direct: 0.1 mg/dL (ref 0.0–0.3)
Total Bilirubin: 0.6 mg/dL (ref 0.3–1.2)
Total Protein: 7.3 g/dL (ref 6.0–8.3)

## 2013-04-09 LAB — CBC WITH DIFFERENTIAL/PLATELET
BASOS ABS: 0.1 10*3/uL (ref 0.0–0.1)
Basophils Relative: 1.4 % (ref 0.0–3.0)
Eosinophils Absolute: 0.2 10*3/uL (ref 0.0–0.7)
Eosinophils Relative: 4.3 % (ref 0.0–5.0)
HCT: 43.4 % (ref 39.0–52.0)
HEMOGLOBIN: 14.6 g/dL (ref 13.0–17.0)
LYMPHS PCT: 48.6 % — AB (ref 12.0–46.0)
Lymphs Abs: 2.7 10*3/uL (ref 0.7–4.0)
MCHC: 33.8 g/dL (ref 30.0–36.0)
MCV: 87.5 fl (ref 78.0–100.0)
Monocytes Absolute: 0.5 10*3/uL (ref 0.1–1.0)
Monocytes Relative: 8.4 % (ref 3.0–12.0)
NEUTROS ABS: 2.1 10*3/uL (ref 1.4–7.7)
Neutrophils Relative %: 37.3 % — ABNORMAL LOW (ref 43.0–77.0)
Platelets: 187 10*3/uL (ref 150.0–400.0)
RBC: 4.96 Mil/uL (ref 4.22–5.81)
RDW: 13.4 % (ref 11.5–14.6)
WBC: 5.5 10*3/uL (ref 4.5–10.5)

## 2013-04-09 LAB — TSH: TSH: 1.34 u[IU]/mL (ref 0.35–5.50)

## 2013-04-09 LAB — LDL CHOLESTEROL, DIRECT: Direct LDL: 188.1 mg/dL

## 2013-04-09 LAB — HIGH SENSITIVITY CRP: CRP, High Sensitivity: 0.56 mg/L (ref 0.000–5.000)

## 2013-04-10 LAB — HIV ANTIBODY (ROUTINE TESTING W REFLEX): HIV: NONREACTIVE

## 2013-04-15 ENCOUNTER — Encounter: Payer: Self-pay | Admitting: Internal Medicine

## 2013-05-24 ENCOUNTER — Encounter: Payer: Self-pay | Admitting: Internal Medicine

## 2013-05-24 ENCOUNTER — Ambulatory Visit (INDEPENDENT_AMBULATORY_CARE_PROVIDER_SITE_OTHER): Payer: BC Managed Care – PPO | Admitting: Internal Medicine

## 2013-05-24 VITALS — BP 108/72 | HR 84 | Ht 71.26 in | Wt 192.4 lb

## 2013-05-24 VITALS — BP 104/76 | HR 68 | Temp 98.9°F | Ht 71.25 in | Wt 194.0 lb

## 2013-05-24 DIAGNOSIS — R195 Other fecal abnormalities: Secondary | ICD-10-CM

## 2013-05-24 DIAGNOSIS — K6289 Other specified diseases of anus and rectum: Secondary | ICD-10-CM

## 2013-05-24 DIAGNOSIS — E785 Hyperlipidemia, unspecified: Secondary | ICD-10-CM

## 2013-05-24 DIAGNOSIS — G4700 Insomnia, unspecified: Secondary | ICD-10-CM

## 2013-05-24 MED ORDER — NA SULFATE-K SULFATE-MG SULF 17.5-3.13-1.6 GM/177ML PO SOLN: ORAL | Status: DC

## 2013-05-24 NOTE — Progress Notes (Signed)
Subjective:  Referred by: Artist Pais, Doe-Hyun R, DO   Patient ID: Ray Case, male    DOB: 11-06-1965, 48 y.o.   MRN: [id number]  HPI  The patient is a pleasant 48 year old [place] American man, a Dietitian and professor at [employer], here for evaluation of heme positive stool. He had an Achilles tear and repair in the fall, and noticed some constipation associated with narcotic pain medication. Rectal exam revealed a heme positive stool and recent primary care visit. Constipation is resolved. He has not seen any visible blood. He does notice occasional transient, very brief rectal pains while sitting for long periods of time. These last for seconds. He complains of some bloating. GI review of systems is otherwise negative.  No Known Allergies Outpatient Prescriptions Prior to Visit  Medication Sig Dispense Refill  . aspirin EC 325 MG tablet Take 1 tablet (325 mg total) by mouth daily.  84 tablet  0  . HYDROcodone-acetaminophen (NORCO) 5-325 MG per tablet Take 1 tablet by mouth every 6 (six) hours as needed.  20 tablet  0  . traMADol (ULTRAM) 50 MG tablet Take 50 mg by mouth every 8 (eight) hours as needed.       No facility-administered medications prior to visit.   Past Medical History  Diagnosis Date  . Complication of anesthesia     Pt had previouds experience of feeling pain after local anesthesia  . Headache(784.0)   . Tuberculosis     Pt exposed and medicated  1990'a  . Hyperlipidemia    Past Surgical History  Procedure Laterality Date  . Fracture surgery Right      arm, metal pin  . Achilles tendon surgery Left 02/24/2013    Procedure: LEFT ACHILLES TENDON REPAIR;  Surgeon: Cheral Almas, MD;  Location: [facility] OR;  Service: Orthopedics;  Laterality: Left;   History   Social History  . Marital Status: Single    Spouse Name: N/A    Number of Children: 0  . Years of Education: N/A   Occupational History  . professor    Social History Main Topics  . Smoking  status: Current Some Day Smoker -- 0.25 packs/day for 8 years    Types: Cigarettes  . Smokeless tobacco: Never Used     Comment: pt goes in and out trying to quit  . Alcohol Use: No     Comment: Prior to his Achilles tendon surgery patient was drinking 2-3 alcoholic beverages per day. He has discontinued alcohol use  . Drug Use: No     Comment: Patient reports discontinuing marijuana use after his Achilles tendon surgery  . 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   One caffeinated beverage daily   Family History  Problem Relation Age of Onset  . Cancer - Lung Mother     Mother died of lung cancer at age 49 (smoker)  . Hypertension Father     Father died in his 49s secondary to aortic aneurysm. He has history of stroke and was a smoker  . Diverticulosis Sister   . Schizophrenia Cousin     and nephew  . Obesity Sister     Sister died at age 49 secondary to complications of morbid obesity   Review of Systems Positive for some back pain, insomnia. His blood pressure was elevated at last PCP visit. Borderline. Review of that note indicates that he  has an HIV-positive sexual partner but has protected intercourse. All other review of systems negative or as per history of present illness.    Objective:   Physical Exam General:  Well-developed, well-nourished and in no acute distress Eyes:  anicteric. ENT:   Mouth and posterior pharynx free of lesions.  Neck:   supple w/o thyromegaly or mass.  Lungs: Clear to auscultation bilaterally. Heart:  S1S2, no rubs, murmurs, gallops. Abdomen:  soft, non-tender, no hepatosplenomegaly, hernia, or mass and BS+.  Rectal: Deferred until colonoscopy Lymph:  no cervical or supraclavicular adenopathy. Extremities:    Left lower extremity slight edema Neuro:  A&O x 3.  Psych:  appropriate mood and  Affect.   Data Reviewed: Lab Results  Component Value Date   WBC 5.5  04/09/2013   HGB 14.6 04/09/2013   HCT 43.4 04/09/2013   MCV 87.5 04/09/2013   PLT 187.0 04/09/2013   Recent primary care note    Assessment & Plan:   1. Heme + stool   2. Rectal pain    Will investigate heme positive stool with a colonoscopy. A rectal pain sound like it could be very mild proctalgia fugax. We'll also investigate for any signs of proctitis along with colorectal neoplasia as cause of heme positive stool.The risks and benefits as well as alternatives of endoscopic procedure(s) have been discussed and reviewed. All questions answered. The patient agrees to proceed.  I appreciate the opportunity to care for this patient.  CC: Thomos Lemonsobert Yoo, DO

## 2013-05-24 NOTE — Progress Notes (Signed)
   Subjective:    Patient ID: Ray Case, male    DOB: 21-Sep-1965, 48 y.o.   MRN: [id number]  HPI  48 year old African American male previously seen for rectal bleeding and constipation for routine followup. Patient seen by gastroenterologist. Screening colonoscopy is planned.  Patient complains of intermittent insomnia. His symptoms worse since tapering off pain medications from his Achilles tendon repair surgery.  Hyperlipidemia - blood tests reviewed.     Review of Systems Negative for melena or hematochezia    Past Medical History  Diagnosis Date  . Complication of anesthesia     Pt had previouds experience of feeling pain after local anesthesia  . Headache(784.0)   . Tuberculosis     Pt exposed and medicated  1990'a  . Hyperlipidemia     History   Social History  . Marital Status: Single    Spouse Name: N/A    Number of Children: 0  . Years of Education: N/A   Occupational History  . professor    Social History Main Topics  . Smoking status: Current Some Day Smoker -- 0.25 packs/day for 8 years    Types: Cigarettes  . Smokeless tobacco: Never Used     Comment: pt goes in and out trying to quit  . Alcohol Use: No     Comment: Prior to his Achilles tendon surgery patient was drinking 2-3 alcoholic beverages per day. He has discontinued alcohol use  . Drug Use: No     Comment: Patient reports discontinuing marijuana use after his Achilles tendon surgery  . Sexual Activity: Not on file   Other Topics Concern  . 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   One caffeinated beverage daily    Past Surgical History  Procedure Laterality Date  . Fracture surgery Right      arm, metal pin  . Achilles tendon surgery Left 02/24/2013    Procedure: LEFT ACHILLES TENDON REPAIR;  Surgeon: Cheral AlmasNaiping Michael Xu, MD;  [facility];  Service: Orthopedics;  Laterality: Left;    Family History  Problem Relation Age of Onset  . Cancer - Lung  Mother     Mother died of lung cancer at age 49 (smoker)  . Hypertension Father     Father died in his 4060s secondary to aortic aneurysm. He has history of stroke and was a smoker  . Diverticulosis Sister   . Schizophrenia Cousin     and nephew  . Obesity Sister     Sister died at age 49 secondary to complications of morbid obesity    No Known Allergies  No current outpatient prescriptions on file prior to visit.   No current facility-administered medications on file prior to visit.    BP 104/76  Pulse 68  Temp(Src) 98.9 F (37.2 C) (Oral)  Ht 5' 11.25" (1.81 m)  Wt 194 lb (87.998 kg)  BMI 26.86 kg/m2    Objective:   Physical Exam  Constitutional: He is oriented to person, place, and time. He appears well-developed and well-nourished. No distress.  Cardiovascular: Normal rate and normal heart sounds.   Pulmonary/Chest: Effort normal and breath sounds normal.  Neurological: He is alert and oriented to person, place, and time.  Skin: Skin is warm and dry.        Assessment & Plan:

## 2013-05-24 NOTE — Patient Instructions (Addendum)
Follow low saturated fat diet. Try using over the counter sublingual melatonin 3 mg as directed for 2-4 weeks. Please complete the following lab tests before your next follow up appointment: CPX and HIV antibody test.

## 2013-05-24 NOTE — Assessment & Plan Note (Signed)
Colonoscopy scheduled for 06/14/2013.

## 2013-05-24 NOTE — Progress Notes (Signed)
Pre visit review using our clinic review tool, if applicable. No additional management support is needed unless otherwise documented below in the visit note. 

## 2013-05-24 NOTE — Patient Instructions (Addendum)
You have been given a separate informational sheet regarding your tobacco use, the importance of quitting and local resources to help you quit.  You may have a light breakfast the morning of prep day (the day before the procedure). You may choose from: eggs, toast, chicken noodle soup, crackers.  You should have your breakfast completed between 8:00 and 9:00 am.  Clear liquids only for the rest of the day on prep day and up until 3 hours before procedure.    You have been scheduled for a colonoscopy with propofol. Please follow written instructions given to you at your visit today.  Please pick up your prep kit at the pharmacy within the next 1-3 days. If you use inhalers (even only as needed), please bring them with you on the day of your procedure. Your physician has requested that you go to www.startemmi.com and enter the access code given to you at your visit today. This web site gives a general overview about your procedure. However, you should still follow specific instructions given to you by our office regarding your preparation for the procedure.   I appreciate the opportunity to care for you.

## 2013-05-24 NOTE — Assessment & Plan Note (Signed)
Patient's 10 year cardiovascular risk is 3.5%. His high sensitivity CRP is normal. I recommend he follow low saturated fat diet. Handout provided.

## 2013-05-24 NOTE — Assessment & Plan Note (Signed)
Patient complains of intermittent insomnia. His symptoms worse since tapering off pain medications (Percocet, Vicodin, tramadol) after Achilles tendon repair surgery. Patient would like to avoid use of sedatives.  Trial of OTC melatonin.  Patient advised to call office if symptoms persist or worsen.

## 2013-05-25 ENCOUNTER — Encounter: Payer: Self-pay | Admitting: Internal Medicine

## 2013-06-14 ENCOUNTER — Encounter: Payer: Self-pay | Admitting: Internal Medicine

## 2013-06-14 ENCOUNTER — Ambulatory Visit (AMBULATORY_SURGERY_CENTER): Payer: BC Managed Care – PPO | Admitting: Internal Medicine

## 2013-06-14 VITALS — BP 115/56 | HR 58 | Temp 96.9°F | Resp 13 | Ht 71.0 in | Wt 192.0 lb

## 2013-06-14 DIAGNOSIS — R195 Other fecal abnormalities: Secondary | ICD-10-CM

## 2013-06-14 MED ORDER — SODIUM CHLORIDE 0.9 % IV SOLN: 500.0000 mL | INTRAVENOUS | Status: DC

## 2013-06-14 NOTE — Patient Instructions (Signed)

## 2013-06-14 NOTE — Op Note (Signed)
[facility] [address] [place], [id number]   COLONOSCOPY PROCEDURE REPORT  PATIENT: Ray AstCyrus, [name]  MR#: [id number] BIRTHDATE: 09/21/1965 , 47  yrs. old GENDER: Male ENDOSCOPIST: Iva Booparl E Garren Greenman, MD, [facility] REFERRED ON:GEXBMWBY:Robert Artist PaisYoo, DO PROCEDURE DATE:  06/14/2013 PROCEDURE:   Colonoscopy, diagnostic First Screening Colonoscopy - Avg.  risk and is 50 yrs.  old or older - No.  Prior Negative Screening - Now for repeat screening. N/A  History of Adenoma - Now for follow-up colonoscopy & has been > or = to 3 yrs.  N/A  Polyps Removed Today? No.  Recommend repeat exam, <10 yrs? No. ASA CLASS:   Class II INDICATIONS:heme-positive stool. MEDICATIONS: propofol (Diprivan) 400mg  IV, MAC sedation, administered by CRNA, and These medications were titrated to patient response per physician's verbal order  DESCRIPTION OF PROCEDURE:   After the risks benefits and alternatives of the procedure were thoroughly explained, informed consent was obtained.  A digital rectal exam revealed no abnormalities of the rectum, A digital rectal exam revealed no prostatic nodules, and A digital rectal exam revealed the prostate was not enlarged.   The LB [id number]  endoscope was introduced through the anus and advanced to the cecum, which was identified by both the appendix and ileocecal valve. No adverse events experienced.   The quality of the prep was excellent using Suprep  The instrument was then slowly withdrawn as the colon was fully examined.      COLON FINDINGS: A normal appearing cecum, ileocecal valve, and appendiceal orifice were identified.  The ascending, hepatic flexure, transverse, splenic flexure, descending, sigmoid colon and rectum appeared unremarkable.  No polyps or cancers were seen. Retroflexed views revealed no abnormalities. The time to cecum=3 minutes 16 seconds.  Withdrawal time=11 minutes 14 seconds.  The scope was withdrawn and the procedure  completed. COMPLICATIONS: There were no complications.  ENDOSCOPIC IMPRESSION: Normal colonoscopy - excellent prep  RECOMMENDATIONS: Repeat colonoscopy 10 years - 2025   eSigned:  Iva Booparl E Addalynne Golding, MD, [facility] 06/14/2013 4:39 PM   cc: The Patient and Thomos Lemonsobert Yoo, DO

## 2013-06-14 NOTE — Progress Notes (Signed)
Procedure ends, to recovery, report given and VSS. 

## 2013-06-15 ENCOUNTER — Telehealth: Payer: Self-pay | Admitting: *Deleted

## 2013-06-15 NOTE — Telephone Encounter (Signed)
  Follow up Call-  Call back number 06/14/2013  Post procedure Call Back phone  # [phone]  Permission to leave phone message Yes     Patient questions:  Do you have a fever, pain , or abdominal swelling? no Pain Score  0 *  Have you tolerated food without any problems? yes  Have you been able to return to your normal activities? yes  Do you have any questions about your discharge instructions: Diet   no Medications  no Follow up visit  no  Do you have questions or concerns about your Care? no  Actions: * If pain score is 4 or above: No action needed, pain <4.  At the gym currently getting ready to workout.

## 2014-06-15 ENCOUNTER — Encounter: Payer: Self-pay | Admitting: Internal Medicine

## 2014-06-15 ENCOUNTER — Ambulatory Visit (INDEPENDENT_AMBULATORY_CARE_PROVIDER_SITE_OTHER): Payer: BC Managed Care – PPO | Admitting: Internal Medicine

## 2014-06-15 VITALS — BP 124/90 | HR 72 | Temp 98.8°F | Ht 71.0 in | Wt 200.0 lb

## 2014-06-15 DIAGNOSIS — G4700 Insomnia, unspecified: Secondary | ICD-10-CM

## 2014-06-15 DIAGNOSIS — R195 Other fecal abnormalities: Secondary | ICD-10-CM

## 2014-06-15 DIAGNOSIS — G8929 Other chronic pain: Secondary | ICD-10-CM

## 2014-06-15 DIAGNOSIS — M549 Dorsalgia, unspecified: Secondary | ICD-10-CM

## 2014-06-15 NOTE — Assessment & Plan Note (Signed)
Patient seen by gastroenterologist-Dr. Leone PayorGessner. Screening colonoscopy was negative.

## 2014-06-15 NOTE — Progress Notes (Addendum)
Subjective:    Patient ID: Ray Case, male    DOB: [birth year], 49 y.o.   MRN: [id number]  HPI  49 year old African-American male History of hyperlipidemia presents for chronic back pain. His symptoms intermittent for many months. He has history of low back pain. He reports history of spondylolisthesis. 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  Patient reports chronic low neck pain/upper thoracic pain. His symptoms worse with working on the computer. He reports he has had good response to spinal manipulation/chiropractic treatments in the past.  Patient last seen in February 2015 for rectal bleeding. He underwent screening colonoscopy. It was unremarkable.  Patient also having intermittent issues with insomnia. He generally sleeps 4-5 hours per night. He usually takes naps during the day.  Patient quit smoking in Sept / Oct 2015.  Review of Systems Negative for radiation of back pain,  No focal weakness    Past Medical History  Diagnosis Date  . Complication of anesthesia     Pt had previouds experience of feeling pain after local anesthesia  . Headache(784.0)   . Tuberculosis     Pt exposed and medicated  1990's  . Hyperlipidemia   . Achilles tendon rupture 2014    Left    History   Social History  . Marital Status: Single    Spouse Name: N/A  . Number of Children: 0  . Years of Education: N/A   Occupational History  . professor    Social History Main Topics  . Smoking status: Current Some Day Smoker -- 0.25 packs/day for 8 years    Types: Cigarettes  . Smokeless tobacco: Never Used     Comment: pt goes in and out trying to quit  . Alcohol Use: No     Comment: Prior to his Achilles tendon surgery patient was drinking 2-3 alcoholic beverages per day. He has discontinued alcohol use  . Drug Use: No     Comment: Patient reports discontinuing marijuana use after his Achilles tendon surgery  . Sexual Activity: Not on file   Other Topics  Concern  . 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   One caffeinated beverage daily    Past Surgical History  Procedure Laterality Date  . Fracture surgery Right      arm, metal pin  . Achilles tendon surgery Left 02/24/2013    Procedure: LEFT ACHILLES TENDON REPAIR;  Surgeon: Cheral AlmasNaiping Michael Xu, MD;  [facility];  Service: Orthopedics;  Laterality: Left;    Family History  Problem Relation Age of Onset  . Cancer - Lung Mother     Mother died of lung cancer at age 10950 (smoker)  . Hypertension Father     Father died in his 4760s secondary to aortic aneurysm. He has history of stroke and was a smoker  . Diverticulosis Sister   . Schizophrenia Cousin     and nephew  . Obesity Sister     Sister died at age 50 secondary to complications of morbid obesity    No Known Allergies  No current outpatient prescriptions on file prior to visit.   No current facility-administered medications on file prior to visit.    BP 124/90 mmHg  Pulse 72  Temp(Src) 98.8 F (37.1 C) (Oral)  Ht 5\' 11"  (1.803 m)  Wt 200 lb (90.719 kg)  BMI 27.91 kg/m2      Objective:   Physical Exam  Constitutional: He is  oriented to person, place, and time. He appears well-developed and well-nourished. No distress.  HENT:  Head: Normocephalic and atraumatic.  Mouth/Throat: Oropharynx is clear and moist.  Eyes: EOM are normal. Pupils are equal, round, and reactive to light.  Neck:  Full range of motion - flexion and extension of cervical spine, mild limitation in side bending  Cardiovascular: Normal rate, regular rhythm and normal heart sounds.   Pulmonary/Chest: Effort normal and breath sounds normal. He has no wheezes.  Musculoskeletal:  Upper extremity muscle strength 5 out of 5  Neurological: He is alert and oriented to person, place, and time.  Diminished right patellar and Achilles reflex  Skin: Skin is warm.  Psychiatric: He has a normal mood and affect. His  behavior is normal.          Assessment & Plan:

## 2014-06-15 NOTE — Assessment & Plan Note (Signed)
Patient has chronic intermittent insomnia. His symptoms worse when he is under stressful situation at work. We discussed sleep hygiene changes and stress management techniques. He declines trial of low-dose tricyclic or trazodone for now.

## 2014-06-15 NOTE — Assessment & Plan Note (Signed)
49 year old African-American male with history of lumbar spondylolisthesis complains of chronic low back pain. He also has chronic lower cervical/upper thoracic discomfort associated with prolonged computer use. He has responded well to spinal manipulation in the past. Refer to Dr. Terrilee FilesZach Smith for further evaluation and treatment.

## 2014-06-15 NOTE — Patient Instructions (Addendum)
Our office will contact you regarding referral to sports medicine - Dr. Katrinka BlazingSmith Please schedule next office visit as CPX.  CPX labs before next office visit

## 2014-06-15 NOTE — Progress Notes (Signed)
Pre visit review using our clinic review tool, if applicable. No additional management support is needed unless otherwise documented below in the visit note. 

## 2014-06-29 ENCOUNTER — Ambulatory Visit: Payer: BC Managed Care – PPO | Admitting: Family Medicine

## 2014-07-04 ENCOUNTER — Other Ambulatory Visit (INDEPENDENT_AMBULATORY_CARE_PROVIDER_SITE_OTHER): Payer: BC Managed Care – PPO

## 2014-07-04 DIAGNOSIS — Z0000 Encounter for general adult medical examination without abnormal findings: Secondary | ICD-10-CM | POA: Diagnosis not present

## 2014-07-04 LAB — CBC WITH DIFFERENTIAL/PLATELET
BASOS PCT: 0.6 % (ref 0.0–3.0)
Basophils Absolute: 0 10*3/uL (ref 0.0–0.1)
EOS PCT: 1.4 % (ref 0.0–5.0)
Eosinophils Absolute: 0.1 10*3/uL (ref 0.0–0.7)
HEMATOCRIT: 44.3 % (ref 39.0–52.0)
HEMOGLOBIN: 14.9 g/dL (ref 13.0–17.0)
LYMPHS ABS: 2.6 10*3/uL (ref 0.7–4.0)
LYMPHS PCT: 51.3 % — AB (ref 12.0–46.0)
MCHC: 33.7 g/dL (ref 30.0–36.0)
MCV: 88 fl (ref 78.0–100.0)
MONOS PCT: 8.2 % (ref 3.0–12.0)
Monocytes Absolute: 0.4 10*3/uL (ref 0.1–1.0)
Neutro Abs: 1.9 10*3/uL (ref 1.4–7.7)
Neutrophils Relative %: 38.5 % — ABNORMAL LOW (ref 43.0–77.0)
Platelets: 183 10*3/uL (ref 150.0–400.0)
RBC: 5.03 Mil/uL (ref 4.22–5.81)
RDW: 14.7 % (ref 11.5–15.5)
WBC: 5 10*3/uL (ref 4.0–10.5)

## 2014-07-04 LAB — HEPATIC FUNCTION PANEL
ALT: 16 U/L (ref 0–53)
AST: 18 U/L (ref 0–37)
Albumin: 4.1 g/dL (ref 3.5–5.2)
Alkaline Phosphatase: 49 U/L (ref 39–117)
BILIRUBIN DIRECT: 0.1 mg/dL (ref 0.0–0.3)
BILIRUBIN TOTAL: 0.5 mg/dL (ref 0.2–1.2)
Total Protein: 7.3 g/dL (ref 6.0–8.3)

## 2014-07-04 LAB — POCT URINALYSIS DIPSTICK
Bilirubin, UA: NEGATIVE
Blood, UA: NEGATIVE
GLUCOSE UA: NEGATIVE
Ketones, UA: NEGATIVE
LEUKOCYTES UA: NEGATIVE
Nitrite, UA: NEGATIVE
Protein, UA: NEGATIVE
UROBILINOGEN UA: 0.2
pH, UA: 5

## 2014-07-04 LAB — BASIC METABOLIC PANEL
BUN: 15 mg/dL (ref 6–23)
CO2: 29 meq/L (ref 19–32)
Calcium: 9.3 mg/dL (ref 8.4–10.5)
Chloride: 104 mEq/L (ref 96–112)
Creatinine, Ser: 0.93 mg/dL (ref 0.40–1.50)
GFR: 110.66 mL/min (ref >60.00)
GLUCOSE: 92 mg/dL (ref 70–99)
POTASSIUM: 4.4 meq/L (ref 3.5–5.1)
Sodium: 138 mEq/L (ref 135–145)

## 2014-07-04 LAB — LIPID PANEL
CHOLESTEROL: 244 mg/dL — AB (ref 0–200)
HDL: 70.1 mg/dL (ref >39.00)
LDL Cholesterol: 159 mg/dL — ABNORMAL HIGH (ref 0–99)
NONHDL: 173.9
Total CHOL/HDL Ratio: 3
Triglycerides: 76 mg/dL (ref 0.0–149.0)
VLDL: 15.2 mg/dL (ref 0.0–40.0)

## 2014-07-04 LAB — TSH: TSH: 1.08 u[IU]/mL (ref 0.35–4.50)

## 2014-07-12 ENCOUNTER — Ambulatory Visit: Payer: BC Managed Care – PPO | Admitting: Family Medicine

## 2014-07-12 ENCOUNTER — Encounter: Payer: Self-pay | Admitting: Family Medicine

## 2014-07-12 ENCOUNTER — Ambulatory Visit (INDEPENDENT_AMBULATORY_CARE_PROVIDER_SITE_OTHER): Payer: BC Managed Care – PPO | Admitting: Family Medicine

## 2014-07-12 VITALS — BP 110/82 | HR 70 | Ht 72.0 in | Wt 199.0 lb

## 2014-07-12 DIAGNOSIS — M549 Dorsalgia, unspecified: Secondary | ICD-10-CM | POA: Diagnosis not present

## 2014-07-12 DIAGNOSIS — M9904 Segmental and somatic dysfunction of sacral region: Secondary | ICD-10-CM

## 2014-07-12 DIAGNOSIS — M9902 Segmental and somatic dysfunction of thoracic region: Secondary | ICD-10-CM

## 2014-07-12 DIAGNOSIS — M999 Biomechanical lesion, unspecified: Secondary | ICD-10-CM | POA: Insufficient documentation

## 2014-07-12 DIAGNOSIS — G8929 Other chronic pain: Secondary | ICD-10-CM | POA: Diagnosis not present

## 2014-07-12 DIAGNOSIS — M542 Cervicalgia: Secondary | ICD-10-CM | POA: Diagnosis not present

## 2014-07-12 DIAGNOSIS — M9901 Segmental and somatic dysfunction of cervical region: Secondary | ICD-10-CM | POA: Diagnosis not present

## 2014-07-12 NOTE — Progress Notes (Signed)
Ray Case D.O. [facility] [address] [place], [place] [address] Phone: [phone] Subjective:    I'm seeing this patient by the request  of:  Thomos Lemonsobert Yoo, DO   CC: Chronic back and neck pain  BJY:NWGNFAOZHYHPI:Subjective Ray Case is a 49 y.o. male coming in with complaint of  Neck and back pain. Patient has had this pain for quite some time. 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 Patient states that it is more with extension that seems to give him difficulty. Patient is doing a lot more sedentary work and states that the quick movements after sitting a long amount of time can be difficult. Patient continues to dance though regularly. Patient denies any radiation down the legs or arms. This describes it as more of a dull aching sensation. Does respond to over-the-counter anti-inflammatories when needed. Patient tries to take these minimally. Patient rates the severity of pain as 5 out of 10.    Past medical history, social, surgical and family history all reviewed in electronic medical record.   Review of Systems: No headache, visual changes, nausea, vomiting, diarrhea, constipation, dizziness, abdominal pain, skin rash, fevers, chills, night sweats, weight loss, swollen lymph nodes, body aches, joint swelling, muscle aches, chest pain, shortness of breath, mood changes.   Objective Blood pressure 110/82, pulse 70, height 6' (1.829 m), weight 199 lb (90.266 kg), SpO2 95 %.  General: No apparent distress alert and oriented x3 mood and affect normal, dressed appropriately.  HEENT: Pupils equal, extraocular movements intact  Respiratory: Patient's speak in full sentences and does not appear short of breath  Cardiovascular: No lower extremity edema, non tender, no erythema  Skin: Warm dry intact with no signs of infection or rash on extremities or on axial skeleton.  Abdomen:  Soft nontender  Neuro: Cranial nerves II through XII are intact, neurovascularly intact in all extremities with 2+ DTRs and 2+ pulses.  Lymph: No lymphadenopathy of posterior or anterior cervical chain or axillae bilaterally.  Gait normal with good balance and coordination.  MSK:  Non tender with full range of motion and good stability and symmetric strength and tone of shoulders, elbows, wrist, hip, knee and ankles bilaterally.  Neck: Inspection unremarkable. No palpable stepoffs. Negative Spurling's maneuver. Full neck range of motion Grip strength and sensation normal in bilateral hands Strength good C4 to T1 distribution No sensory change to C4 to T1 Negative Hoffman sign bilaterally Reflexes normal Back Exam:  Inspection: Unremarkable  Motion: Flexion 45 deg, Extension 45 deg, Side Bending to 45 deg bilaterally,  Rotation to 45 deg bilaterally  SLR laying: Negative  XSLR laying: Negative  Palpable tenderness: Right hip flexors bilaterally FABER: negative. Sensory change: Gross sensation intact to all lumbar and sacral dermatomes.  Reflexes: 2+ at both patellar tendons, 2+ at achilles tendons, Babinski's downgoing.  Strength at foot  Plantar-flexion: 5/5 Dorsi-flexion: 5/5 Eversion: 5/5 Inversion: 5/5  Leg strength  Quad: 5/5 Hamstring: 5/5 Hip flexor: 5/5 Hip abductors: 4/5  Gait unremarkable.   Osteopathic findings Cervical C4 flexed rotated and side bent right C7 flexed rotated and side bent left Thoracic T1 extended rotated and side bent left with elevated first rib T5 extended rotated and side bent right Lumbar L2 flexed rotated and side bent right Sacrum Left on left  Procedure note 97110; 15 minutes spent for Therapeutic exercises as stated in above  notes.  This included exercises focusing on stretching, strengthening, with significant focus on eccentric aspects.   Proper technique shown and discussed handout in great detail with ATC.  All questions were  discussed and answered.     Impression and Recommendations:     This case required medical decision making of moderate complexity.

## 2014-07-12 NOTE — Assessment & Plan Note (Signed)
Patient's back pain is multifactorial and secondary to more of the muscle imbalances. Patient does have weak hip abductors. I'm not concern for any radicular symptoms at this time. Discuss core strengthening exercises and patient did work with Event organiserathletic trainer today. Discussed icing regimen and over-the-counter medicines a could be beneficial. Patient will come back and see me again in 3-4 weeks for further evaluation and treatment. Patient did respond well to osteopathic manipulation.

## 2014-07-12 NOTE — Assessment & Plan Note (Signed)
Decision today to treat with OMT was based on Physical Exam  After verbal consent patient was treated with HVLA, ME techniques in all, thoracic, lumbar and sacral areas  Patient tolerated the procedure well with improvement in symptoms  Patient given exercises, stretches and lifestyle modifications  See medications in patient instructions if given  Patient will follow up in 3-4 weeks

## 2014-07-12 NOTE — Progress Notes (Signed)
Pre visit review using our clinic review tool, if applicable. No additional management support is needed unless otherwise documented below in the visit note. 

## 2014-07-12 NOTE — Patient Instructions (Signed)
Good to meet you Ice 20 minutes 2 times daily. Usually after activity and before bed. On wall heels, butt shoulder and head touching for a goal of 5 minutes daily Turmeric 500mg  twice daily Vitamin D 2000 IU daily Fish oil 2 grams daily Exercises 3 times a week.  See me again in 3 weeks.

## 2014-07-13 ENCOUNTER — Ambulatory Visit (INDEPENDENT_AMBULATORY_CARE_PROVIDER_SITE_OTHER): Payer: BC Managed Care – PPO | Admitting: Internal Medicine

## 2014-07-13 ENCOUNTER — Encounter: Payer: Self-pay | Admitting: Internal Medicine

## 2014-07-13 VITALS — BP 130/90 | HR 80 | Temp 98.5°F | Wt 200.4 lb

## 2014-07-13 DIAGNOSIS — Z0001 Encounter for general adult medical examination with abnormal findings: Secondary | ICD-10-CM | POA: Insufficient documentation

## 2014-07-13 DIAGNOSIS — Z0000 Encounter for general adult medical examination without abnormal findings: Secondary | ICD-10-CM | POA: Diagnosis not present

## 2014-07-13 NOTE — Patient Instructions (Signed)
Goals:  Weight loss, regular exercise, low saturated fat diet Monitor your blood pressure at home 1-2 times per week and keep a log You can try using probiotic - Align or Culturelle over the counter

## 2014-07-13 NOTE — Progress Notes (Signed)
Subjective:    Patient ID: Ray Case, male    DOB: Jul 25, 1965, 49 y.o.   MRN: [id number]  HPI  49 year old African-American male with history of tobacco use presents for routine physical.  Patient has been working with Dr. Katrinka Blazing for low back pain.  His screening blood work reviewed.  Social and family history reviewed and updated.    Review of Systems  Constitutional: Negative for activity change, appetite change and unexpected weight change.  Eyes: Negative for visual disturbance.  Respiratory: Negative for cough, chest tightness and shortness of breath.  Cardiovascular: Negative for chest pain.  Genitourinary: Negative for difficulty urinating.  Neurological: Negative for headaches.  Gastrointestinal: Negative for abdominal pain, heartburn melena or hematochezia.  Occasional abdominal bloating and gas Psych: Negative for depression or anxiety Endo:  No polyuria or polydypsia        Past Medical History  Diagnosis Date  . Complication of anesthesia     Pt had previouds experience of feeling pain after local anesthesia  . Headache(784.0)   . Tuberculosis     Pt exposed and medicated  1990's  . Hyperlipidemia   . Achilles tendon rupture 2014    Left    History   Social History  . Marital Status: Single    Spouse Name: N/A  . Number of Children: 0  . Years of Education: N/A   Occupational History  . professor    Social History Main Topics  . Smoking status: Former Smoker -- 0.25 packs/day for 8 years    Types: Cigarettes    Quit date: 12/27/2013  . Smokeless tobacco: Never Used     Comment: pt goes in and out trying to quit  . Alcohol Use: No     Comment: Prior to his Achilles tendon surgery patient was drinking 2-3 alcoholic beverages per day. He has discontinued alcohol use  . Drug Use: No     Comment: Patient reports discontinuing marijuana use after his Achilles tendon surgery  . Sexual Activity: Not on file   Other Topics Concern  . 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   One caffeinated beverage daily    Past Surgical History  Procedure Laterality Date  . Fracture surgery Right      arm, metal pin  . Achilles tendon surgery Left 02/24/2013    Procedure: LEFT ACHILLES TENDON REPAIR;  Surgeon: Cheral Almas, MD;  Location: [facility] OR;  Service: Orthopedics;  Laterality: Left;    Family History  Problem Relation Age of Onset  . Cancer - Lung Mother     Mother died of lung cancer at age 70 (smoker)  . Hypertension Father     Father died in his 42s secondary to aortic aneurysm. He has history of stroke and was a smoker  . Diverticulosis Sister   . Schizophrenia Cousin     and nephew  . Obesity Sister     Sister died at age 66 secondary to complications of morbid obesity    No Known Allergies  Current Outpatient Prescriptions on File Prior to Visit  Medication Sig Dispense Refill  . glucosamine-chondroitin 500-400 MG tablet Take 1 tablet by mouth 3 (three) times daily.    . Multiple Vitamin (MULTIVITAMIN) tablet Take 1 tablet by mouth daily.     No current facility-administered medications on file prior to visit.    BP 130/90 mmHg  Pulse 80  Temp(Src) 98.5 F (36.9 C) (Oral)  Wt  200 lb 6.4 oz (90.901 kg)    Objective:   Physical Exam  Constitutional: He is oriented to person, place, and time. He appears well-developed and well-nourished.  HENT:  Head: Normocephalic and atraumatic.  Right Ear: External ear normal.  Left Ear: External ear normal.  Eyes: Conjunctivae and EOM are normal. Pupils are equal, round, and reactive to light.  Neck: Neck supple.  No carotid bruit  Cardiovascular: Normal rate, regular rhythm and normal heart sounds.   No murmur heard. Pulmonary/Chest: Effort normal and breath sounds normal. He has no wheezes.  Abdominal: Soft. Bowel sounds are normal. There is no tenderness.  Musculoskeletal: He exhibits no edema.  Neurological: He is alert and  oriented to person, place, and time. No cranial nerve deficit.  Skin: Skin is warm and dry.  Psychiatric: He has a normal mood and affect. His behavior is normal.          Assessment & Plan:

## 2014-07-13 NOTE — Progress Notes (Signed)
Pre visit review using our clinic review tool, if applicable. No additional management support is needed unless otherwise documented below in the visit note. 

## 2014-07-13 NOTE — Assessment & Plan Note (Signed)
Reviewed adult health maintenance protocols. Screening EKG is unremarkable. Blood work shows elevated LDL. 10 year cardiovascular risk is 5%. I recommended weight loss, regular exercise and low saturated fat diet.  Patient also has pre-hypertension.  Lifestyle changes recommended along with low-salt diet.  Repeat FLP in 6 months

## 2014-08-02 ENCOUNTER — Ambulatory Visit (INDEPENDENT_AMBULATORY_CARE_PROVIDER_SITE_OTHER): Payer: BC Managed Care – PPO | Admitting: Family Medicine

## 2014-08-02 ENCOUNTER — Encounter: Payer: Self-pay | Admitting: Family Medicine

## 2014-08-02 VITALS — BP 124/72 | HR 74 | Ht 72.0 in | Wt 199.0 lb

## 2014-08-02 DIAGNOSIS — M545 Low back pain, unspecified: Secondary | ICD-10-CM

## 2014-08-02 DIAGNOSIS — M9902 Segmental and somatic dysfunction of thoracic region: Secondary | ICD-10-CM

## 2014-08-02 DIAGNOSIS — M999 Biomechanical lesion, unspecified: Secondary | ICD-10-CM

## 2014-08-02 NOTE — Patient Instructions (Signed)
Good to see you Ray Case is your friend.  Continue to do the exercises.  New exercise Y-T-A hold each position for 2 seconds and repeat 10 times daily.  Focus on walking with toes forward.  Continue the other exercises.  Vitamin D 2000 IU daily See me again in 4 weeks.

## 2014-08-02 NOTE — Progress Notes (Signed)
Pre visit review using our clinic review tool, if applicable. No additional management support is needed unless otherwise documented below in the visit note. 

## 2014-08-02 NOTE — Progress Notes (Signed)
[name] [address] [place], [place] [address] Phone: [phone] Subjective:     CC: Chronic back and neck pain follow up  BJY:NWGNFAOZHY Ray Case is a 49 y.o. male coming in with complaint of  Neck and back pain. Patient has had this pain for quite some time. Back in 2010 patient was worked up and had more of a spondylothesis. 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 He was seen previously and did have more of a muscle imbalance. Patient was given home exercises, icing protocol, we discussed over-the-counter medications as well. Patient states overall he is making improvement. Patient is only doing the exercises intermediately. Patient denies any new symptoms. Patient has started walking on a more regular basis in with the school season starting to wind down he has been able to focus a little more on himself. Patient has been able to get some of the over-the-counter Vitamins but Not All of Them. Has Noticed Some Mild Improvement.   Previous x-rays in 2010 :Grade 1 - 2 anterolisthesis of L4 on L5. 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     Past medical history, social, surgical and family history all reviewed in electronic medical record.   Review of Systems: No headache, visual changes, nausea, vomiting, diarrhea, constipation, dizziness, abdominal pain, skin rash, fevers, chills, night sweats, weight loss, swollen lymph nodes, body aches, joint swelling, muscle aches, chest pain, shortness of breath, mood changes.   Objective Blood pressure 124/72, pulse 74, height 6' (1.829 m), weight 199 lb (90.266 kg), SpO2 97 %.  General: No apparent distress alert and oriented x3 mood and affect normal, dressed appropriately.  HEENT: Pupils equal, extraocular movements intact  Respiratory: Patient's speak in full sentences and does not appear short of breath  Cardiovascular: No lower extremity edema, non tender, no erythema    Skin: Warm dry intact with no signs of infection or rash on extremities or on axial skeleton.  Abdomen: Soft nontender  Neuro: Cranial nerves II through XII are intact, neurovascularly intact in all extremities with 2+ DTRs and 2+ pulses.  Lymph: No lymphadenopathy of posterior or anterior cervical chain or axillae bilaterally.  Gait normal with good balance and coordination.  MSK:  Non tender with full range of motion and good stability and symmetric strength and tone of shoulders, elbows, wrist, hip, knee and ankles bilaterally.  Neck: Inspection unremarkable. No palpable stepoffs. Negative Spurling's maneuver. Full neck range of motion Grip strength and sensation normal in bilateral hands Strength good C4 to T1 distribution No sensory change to C4 to T1 Negative Hoffman sign bilaterally Reflexes normal No change from previous exam Back Exam:  Inspection: Unremarkable  Motion: Flexion 45 deg, Extension 45 deg, Side Bending to 45 deg bilaterally,  Rotation to 45 deg bilaterally  SLR laying: Negative  XSLR laying: Negative  Palpable tenderness: tight hip flexors bilaterally compared to previous exam FABER: negative. Sensory change: Gross sensation intact to all lumbar and sacral dermatomes.  Reflexes: 2+ at both patellar tendons, 2+ at achilles tendons, Babinski's downgoing.  Strength at foot  Plantar-flexion: 5/5 Dorsi-flexion: 5/5 Eversion: 5/5 Inversion: 5/5  Leg strength  Quad: 5/5 Hamstring: 5/5 Hip flexor: 5/5 Hip abductors: 4/5  Gait unremarkable.   Osteopathic findings Cervical C4 flexed rotated and side bent right C7 flexed rotated and side bent left Thoracic T1 extended rotated and side bent left with elevated first rib T5 extended rotated and side bent right Lumbar  L2 flexed rotated and side bent right Sacrum Left on left     Impression and Recommendations:     This case required medical decision making of moderate complexity.

## 2014-08-02 NOTE — Assessment & Plan Note (Signed)
Still secondary to muscle imbalances. We discussed icing regimen as well as home exercises. Patient was given other back exercises to help with the postural changes that would be beneficial. We discussed other over-the-counter natural supplementations and may be helpful as well. Patient come back again in 4 weeks for further evaluation and treatment.

## 2014-08-02 NOTE — Assessment & Plan Note (Signed)
Decision today to treat with OMT was based on Physical Exam  After verbal consent patient was treated with HVLA, ME techniques in all, thoracic, lumbar and sacral areas  Patient tolerated the procedure well with improvement in symptoms  Patient given exercises, stretches and lifestyle modifications  See medications in patient instructions if given  Patient will follow up in 4 weeks

## 2014-08-31 ENCOUNTER — Ambulatory Visit: Payer: BC Managed Care – PPO | Admitting: Family Medicine

## 2016-11-26 NOTE — Progress Notes (Signed)
Ray Case, Ray Case Phone: [phone] Subjective:     CC: Right foot pain  BJY:NWGNFAOZHY  Ray Case is a 51 y.o. male coming in with complaint of Right foot pain. Patient is a Forensic psychologist. Has had difficulty with his feet previously. This time that seems to be on the medial aspect of the ankle more. Does not rib number any true injury but states one morning woke up and had significant amount of swelling in the area. With significant tight. Over the course of time is improving but very slowly. Patient rates the severity of pain is 3 out of 10 at this time.     Past Medical History:  Diagnosis Date  . Achilles tendon rupture 2014   Left  . Complication of anesthesia    Pt had previouds experience of feeling pain after local anesthesia  . Headache(784.0)   . Hyperlipidemia   . Tuberculosis    Pt exposed and medicated  1990's   Past Surgical History:  Procedure Laterality Date  . ACHILLES TENDON SURGERY Left 02/24/2013   Procedure: LEFT ACHILLES TENDON REPAIR;  Surgeon: Cheral Almas, MD;  Location: [facility] OR;  Service: Orthopedics;  Laterality: Left;  . FRACTURE SURGERY Right     arm, metal pin   Social History   Social History  . Marital status: Single    Spouse name: N/A  . Number of children: 0  . Years of education: N/A   Occupational History  . professor [employer]   Social History Main Topics  . Smoking status: Former Smoker    Packs/day: 0.25    Years: 8.00    Types: Cigarettes    Quit date: 12/27/2013  . Smokeless tobacco: Never Used     Comment: pt goes in and out trying to quit  . Alcohol use No     Comment: Prior to his Achilles tendon surgery patient was drinking 2-3 alcoholic beverages per day. He has discontinued alcohol use  . Drug use: No     Comment: Patient reports discontinuing marijuana use after his Achilles tendon surgery  . Sexual activity: Not on file   Other  Topics Concern  . 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   One caffeinated beverage daily   No Known Allergies Family History  Problem Relation Age of Onset  . Cancer - Lung Mother        Mother died of lung cancer at age 38 (smoker)  . Hypertension Father        Father died in his 44s secondary to aortic aneurysm. He has history of stroke and was a smoker  . Diverticulosis Sister   . Schizophrenia Cousin        and nephew  . Obesity Sister        Sister died at age 50 secondary to complications of morbid obesity     Past medical history, social, surgical and family history all reviewed in electronic medical record.  No pertanent information unless stated regarding to the chief complaint.   Review of Systems:Review of systems updated and as accurate as of 11/26/16  No headache, visual changes, nausea, vomiting, diarrhea, constipation, dizziness, abdominal pain, skin rash, fevers, chills, night sweats, weight loss, swollen lymph nodes, body aches, joint swelling, muscle aches, chest pain, shortness of breath, mood changes.   Objective  There were no vitals taken for this visit. Systems examined below  as of 11/26/16   General: No apparent distress alert and oriented x3 mood and affect normal, dressed appropriately.  HEENT: Pupils equal, extraocular movements intact  Respiratory: Patient's speak in full sentences and does not appear short of breath  Cardiovascular: No lower extremity edema, non tender, no erythema  Skin: Warm dry intact with no signs of infection or rash on extremities or on axial skeleton.  Abdomen: Soft nontender  Neuro: Cranial nerves II through XII are intact, neurovascularly intact in all extremities with 2+ DTRs and 2+ pulses.  Lymph: No lymphadenopathy of posterior or anterior cervical chain or axillae bilaterally.  Gait normal with good balance and coordination.  MSK:  Non tender with full range of motion and good stability  and symmetric strength and tone of shoulders, elbows, wrist, hip, knee bilaterally.  Ankle: Right No visible erythema or swelling. Very mild overpronation of the hindfoot Range of motion is full in all directions. Strength is 5/5 in all directions. Stable lateral and medial ligaments; squeeze test and kleiger test unremarkable; Talar dome nontender; No pain at base of 5th MT; No tenderness over cuboid; No tenderness over N spot or navicular prominence No tenderness on posterior aspects of lateral and medial malleolus No sign of peroneal tendon subluxations or tenderness to palpation Negative tarsal tunnel tinel's Does have some tenderness over the posterior tibialis tendon Able to walk 4 steps.  MSK US performed of:  This study was ordered, performed, and interpreted by Terrilee Files D.O.  Foot/Ankle:      Talar dome unremarkable  Ankle mortise without effusion. Posterior tibialis tendon does show that patient does have a accessory tendon in the area. Very mild hypoechoic changes. No true tear appreciated. No increase in Doppler flow.  IMPRESSION: Posterior tibialis tendinitis     Impression and Recommendations:     This case required medical decision making of moderate complexity.      Note: This dictation was prepared with Dragon dictation along with smaller phrase technology. Any transcriptional errors that result from this process are unintentional.

## 2016-11-27 ENCOUNTER — Ambulatory Visit (INDEPENDENT_AMBULATORY_CARE_PROVIDER_SITE_OTHER): Payer: BC Managed Care – PPO | Admitting: Family Medicine

## 2016-11-27 ENCOUNTER — Encounter: Payer: Self-pay | Admitting: Family Medicine

## 2016-11-27 ENCOUNTER — Ambulatory Visit: Payer: Self-pay

## 2016-11-27 VITALS — BP 106/80 | HR 58 | Ht 72.0 in | Wt 199.0 lb

## 2016-11-27 DIAGNOSIS — M76821 Posterior tibial tendinitis, right leg: Secondary | ICD-10-CM | POA: Diagnosis not present

## 2016-11-27 DIAGNOSIS — M76891 Other specified enthesopathies of right lower limb, excluding foot: Secondary | ICD-10-CM | POA: Diagnosis not present

## 2016-11-27 DIAGNOSIS — M79671 Pain in right foot: Secondary | ICD-10-CM

## 2016-11-27 DIAGNOSIS — M76899 Other specified enthesopathies of unspecified lower limb, excluding foot: Secondary | ICD-10-CM | POA: Insufficient documentation

## 2016-11-27 NOTE — Assessment & Plan Note (Signed)
Noted on weakness. We discussed home exercises. We discussed core strengthening. Patient will try to do different exercises in the gym. Follow-up again in 4 weeks

## 2016-11-27 NOTE — Assessment & Plan Note (Signed)
Posterior tibialis tendinitis. Topical anti-inflammatories given, home exercises, hip abductor strengthening exercises also given. Discussed proper shoes and over-the-counter orthotics. Follow-up again in 4 weeks

## 2016-11-27 NOTE — Patient Instructions (Signed)
Good to see you  Ray Case is your friend.  Posterior tibialis tendonitis.  Exercises 3 times a week.  I like the orthotics Exercises on wall.  Heel and butt touching.  Raise leg 6 inches and hold 2 seconds.  Down slow for count of 4 seconds.  1 set of 30 reps daily on both sides.  pennsaid pinkie amount topically 2 times daily as needed.   Kepe being active See em again in 4 weeks and see how we are doing.

## 2016-12-30 ENCOUNTER — Ambulatory Visit (INDEPENDENT_AMBULATORY_CARE_PROVIDER_SITE_OTHER): Payer: BC Managed Care – PPO | Admitting: Family Medicine

## 2016-12-30 ENCOUNTER — Ambulatory Visit (INDEPENDENT_AMBULATORY_CARE_PROVIDER_SITE_OTHER)
Admission: RE | Admit: 2016-12-30 | Discharge: 2016-12-30 | Disposition: A | Discharge status: Home / Self Care | Payer: BC Managed Care – PPO | Source: Ambulatory Visit | Attending: Family Medicine | Admitting: Family Medicine

## 2016-12-30 ENCOUNTER — Encounter: Payer: Self-pay | Admitting: Family Medicine

## 2016-12-30 VITALS — BP 118/84 | HR 68 | Ht 73.0 in | Wt 197.0 lb

## 2016-12-30 DIAGNOSIS — M5441 Lumbago with sciatica, right side: Secondary | ICD-10-CM | POA: Diagnosis not present

## 2016-12-30 DIAGNOSIS — M999 Biomechanical lesion, unspecified: Secondary | ICD-10-CM | POA: Diagnosis not present

## 2016-12-30 DIAGNOSIS — M545 Low back pain, unspecified: Secondary | ICD-10-CM

## 2016-12-30 DIAGNOSIS — G8929 Other chronic pain: Secondary | ICD-10-CM | POA: Diagnosis not present

## 2016-12-30 MED ORDER — GABAPENTIN 100 MG PO CAPS: 200.0000 mg | ORAL_CAPSULE | Freq: Every day | ORAL | 3 refills | Status: DC

## 2016-12-30 NOTE — Patient Instructions (Signed)
Good to see you  One knee down one knee up tilt pelvis forward.  Hands overhead then rotate to upper leg.  Hold 10 seconds, relax, repeat and do other side as well.  Very important when after being in a flexed position for a long amount of time.  Exercises on wall.  Heel and butt touching.  Raise leg 6 inches and hold 2 seconds.  Down slow for count of 4 seconds.  1 set of 30 reps daily on both sides.  Keep it up  Gabapentin  at night to help nerve See me again in 4 weeks.

## 2016-12-30 NOTE — Assessment & Plan Note (Signed)
Decision today to treat with OMT was based on Physical Exam  After verbal consent patient was treated with HVLA, ME, FPR techniques in cervical, thoracic, lumbar and sacral areas  Patient tolerated the procedure well with improvement in symptoms  Patient given exercises, stretches and lifestyle modifications  See medications in patient instructions if given  Patient will follow up in 3-4 weeks  

## 2016-12-30 NOTE — Progress Notes (Signed)
[name] [address] [place], [place] [address] Phone: [phone] Subjective:     CC: low back pain   BJY:NWGNFAOZHY  Ray Case is a 51 y.o. male coming in for right foot pain. He states that he was able to dance without pain. His back continues to bother him. His pain is constant and seems to get sore with dancing. Walking also seems to bother his back. Patient states and is more of a dull throbbing aching pain. Sometimes has some mild radiation down the posterior aspect of the right leg. Patient denies any numbness though. Patient continues to actively dance and a very high level.  Patient previously did have a back x-rays rule out a mild spondylolisthesis this is from 2010 and independently visualized by me.     Past Medical History:  Diagnosis Date  . Achilles tendon rupture 2014   Left  . Complication of anesthesia    Pt had previouds experience of feeling pain after local anesthesia  . Headache(784.0)   . Hyperlipidemia   . Tuberculosis    Pt exposed and medicated  1990's   Past Surgical History:  Procedure Laterality Date  . ACHILLES TENDON SURGERY Left 02/24/2013   Procedure: LEFT ACHILLES TENDON REPAIR;  Surgeon: Cheral Almas, MD;  Location: [facility] OR;  Service: Orthopedics;  Laterality: Left;  . FRACTURE SURGERY Right     arm, metal pin   Social History   Social History  . Marital status: Single    Spouse name: N/A  . Number of children: 0  . Years of education: N/A   Occupational History  . professor [employer]   Social History Main Topics  . Smoking status: Former Smoker    Packs/day: 0.25    Years: 8.00    Types: Cigarettes    Quit date: 12/27/2013  . Smokeless tobacco: Never Used     Comment: pt goes in and out trying to quit  . Alcohol use No     Comment: Prior to his Achilles tendon surgery patient was drinking 2-3 alcoholic beverages per day. He has discontinued alcohol use  . Drug use: No     Comment:  Patient reports discontinuing marijuana use after his Achilles tendon surgery  . Sexual activity: Not Asked   Other Topics Concern  . 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   One caffeinated beverage daily   No Known Allergies Family History  Problem Relation Age of Onset  . Cancer - Lung Mother        Mother died of lung cancer at age 49 (smoker)  . Hypertension Father        Father died in his 76s secondary to aortic aneurysm. He has history of stroke and was a smoker  . Diverticulosis Sister   . Schizophrenia Cousin        and nephew  . Obesity Sister        Sister died at age 59 secondary to complications of morbid obesity     Past medical history, social, surgical and family history all reviewed in electronic medical record.  No pertanent information unless stated regarding to the chief complaint.   Review of Systems:Review of systems updated and as accurate as of 12/30/16  No headache, visual changes, nausea, vomiting, diarrhea, constipation, dizziness, abdominal pain, skin rash, fevers, chills, night sweats, weight loss, swollen lymph nodes, body aches, joint swelling, muscle aches, chest pain, shortness of breath, mood  changes.   Objective  Blood pressure 118/84, pulse 68, height  (1.854 m), weight 197 lb (89.4 kg), SpO2 93 %. Systems examined below as of 12/30/16   General: No apparent distress alert and oriented x3 mood and affect normal, dressed appropriately.  HEENT: Pupils equal, extraocular movements intact  Respiratory: Patient's speak in full sentences and does not appear short of breath  Cardiovascular: No lower extremity edema, non tender, no erythema  Skin: Warm dry intact with no signs of infection or rash on extremities or on axial skeleton.  Abdomen: Soft nontender  Neuro: Cranial nerves II through XII are intact, neurovascularly intact in all extremities with 2+ DTRs and 2+ pulses.  Lymph: No lymphadenopathy of posterior or  anterior cervical chain or axillae bilaterally.  Gait normal with good balance and coordination.  MSK:  Non tender with full range of motion and good stability and symmetric strength and tone of shoulders, elbows, wrist, hip, knee and ankles bilaterally.  Back Exam:  Inspection: Unremarkable  Motion: Flexion 45 deg, Extension 25 deg, Side Bending to 35 deg bilaterally,  Rotation to 45 deg bilaterally  SLR laying: Negative  XSLR laying: Negative  Palpable tenderness: Tender to palpation in the paraspinal musculature lumbar spine right greater than left. FABER: negative. Sensory change: Gross sensation intact to all lumbar and sacral dermatomes.  Reflexes: 2+ at both patellar tendons, 2+ at achilles tendons, Babinski's downgoing.  Strength at foot  Plantar-flexion: 5/5 Dorsi-flexion: 5/5 Eversion: 5/5 Inversion: 5/5  Leg strength  Quad: 5/5 Hamstring: 5/5 Hip flexor: 5/5 Hip abductors: 4/5 right compared to left  Osteopathic findings C2 flexed rotated and side bent right T3 extended rotated and side bent right inhaled third rib T5 extended rotated and side bent left L3 flexed rotated and side bent right Sacrum right on right      Impression and Recommendations:     This case required medical decision making of moderate complexity.      Note: This dictation was prepared with Dragon dictation along with smaller phrase technology. Any transcriptional errors that result from this process are unintentional.

## 2016-12-30 NOTE — Assessment & Plan Note (Signed)
Spondylolisthesis greater than 8 years ago. We will get repeat x-rays to see if any further evaluation or any type of progression. Patient will start on gabapentin at nighttime. Encourage him to continue to work on the hip abductor and stretching the psoas muscles. We discussed icing regimen. Follow-up again in 4 weeks responded well to manipulation

## 2017-02-03 ENCOUNTER — Ambulatory Visit: Payer: BC Managed Care – PPO | Admitting: Family Medicine

## 2017-02-03 ENCOUNTER — Encounter: Payer: Self-pay | Admitting: Family Medicine

## 2017-02-03 VITALS — BP 120/82 | HR 68 | Ht 72.0 in | Wt 198.0 lb

## 2017-02-03 DIAGNOSIS — M999 Biomechanical lesion, unspecified: Secondary | ICD-10-CM

## 2017-02-03 DIAGNOSIS — M5441 Lumbago with sciatica, right side: Secondary | ICD-10-CM

## 2017-02-03 DIAGNOSIS — G8929 Other chronic pain: Secondary | ICD-10-CM | POA: Diagnosis not present

## 2017-02-03 NOTE — Assessment & Plan Note (Signed)
Decision today to treat with OMT was based on Physical Exam  After verbal consent patient was treated with HVLA, ME, FPR techniques in cervical, thoracic, lumbar and sacral areas  Patient tolerated the procedure well with improvement in symptoms  Patient given exercises, stretches and lifestyle modifications  See medications in patient instructions if given  Patient will follow up in 4-8 weeks 

## 2017-02-03 NOTE — Assessment & Plan Note (Signed)
Patient has been doing relatively well.  Discussed with patient in great length.  Patient is going to continue with conservative therapy including home exercise.  We discussed the possibility of sacroiliac injections.  Patient has declined this at the moment.  Patient will follow up with me again in 4-8 weeks.

## 2017-02-03 NOTE — Progress Notes (Signed)
Tawana ScaleZach Case D.O. [facility] [address] [place], [place] [address] Phone: [phone] Subjective:    I'm seeing this patient by the request  of:    CC: Back pain follow-up  BJY:NWGNFAOZHYHPI:Subjective  Ray Case Ray Case PeekCyrus is a 51 y.o. male coming in for follow up for back and foot pain. He had a massage yesterday and had some work on the right foot. He has been wearing proper shoes which has been helping. He also notes some excessive tightness on the right side of his hip that is causing hip external rotation. He stopped taking the gabapentin due to the side effects. The lower back still bothers him in the same spot.  Joint.  Has done fairly well with the conservative therapy.  Still having some discomfort.  Known to also have a spondylolisthesis at L4-L5.  Patient has not been very diligent with doing these exercises but continues to be significantly active.      Past Medical History:  Diagnosis Date  . Achilles tendon rupture 2014   Left  . Complication of anesthesia    Pt had previouds experience of feeling pain after local anesthesia  . Headache(784.0)   . Hyperlipidemia   . Tuberculosis    Pt exposed and medicated  1990's   Past Surgical History:  Procedure Laterality Date  . FRACTURE SURGERY Right     arm, metal pin   Social History   Socioeconomic History  . Marital status: Single    Spouse name: Not on file  . Number of children: 0  . Years of education: Not on file  . Highest education level: Not on file  Social Needs  . Financial resource strain: Not on file  . Food insecurity - worry: Not on file  . Food insecurity - inability: Not on file  . Transportation needs - medical: Not on file  . Transportation needs - non-medical: Not on file  Occupational History  . Occupation: professor    Employer: [employer]  Tobacco Use  . Smoking status: Former Smoker    Packs/day: 0.25    Years: 8.00    Pack years: 2.00    Types: Cigarettes    Last attempt to quit:  12/27/2013    Years since quitting: 3.1  . Smokeless tobacco: Never Used  . Tobacco comment: pt goes in and out trying to quit  Substance and Sexual Activity  . Alcohol use: No    Alcohol/week: 2.4 oz    Types: 2 Glasses of wine, 2 Shots of liquor per week    Comment: Prior to his Achilles tendon surgery patient was drinking 2-3 alcoholic beverages per day. He has discontinued alcohol use  . Drug use: No    Comment: Patient reports discontinuing marijuana use after his Achilles tendon surgery  . Sexual activity: Not on file  Other Topics Concern  . 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   One caffeinated beverage daily   No Known Allergies Family History  Problem Relation Age of Onset  . Cancer - Lung Mother        Mother died of lung cancer at age 52 (smoker)  . Hypertension Father        Father died in his 6460s secondary to aortic aneurysm. He has history of stroke and was a smoker  . Diverticulosis Sister   . Schizophrenia Cousin        and nephew  . Obesity Sister  Sister died at age 44 secondary to complications of morbid obesity     Past medical history, social, surgical and family history all reviewed in electronic medical record.  No pertanent information unless stated regarding to the chief complaint.   Review of Systems:Review of systems updated and as accurate as of 02/03/17  No headache, visual changes, nausea, vomiting, diarrhea, constipation, dizziness, abdominal pain, skin rash, fevers, chills, night sweats, weight loss, swollen lymph nodes, body aches, joint swelling, chest pain, shortness of breath, mood changes.  Positive muscle aches  Objective  There were no vitals taken for this visit. Systems examined below as of 02/03/17   General: No apparent distress alert and oriented x3 mood and affect normal, dressed appropriately.  HEENT: Pupils equal, extraocular movements intact  Respiratory: Patient's speak in full  sentences and does not appear short of breath  Cardiovascular: No lower extremity edema, non tender, no erythema  Skin: Warm dry intact with no signs of infection or rash on extremities or on axial skeleton.  Abdomen: Soft nontender  Neuro: Cranial nerves II through XII are intact, neurovascularly intact in all extremities with 2+ DTRs and 2+ pulses.  Lymph: No lymphadenopathy of posterior or anterior cervical chain or axillae bilaterally.  Gait normal with good balance and coordination.  MSK:  Non tender with full range of motion and good stability and symmetric strength and tone of shoulders, elbows, wrist, hip, knee and ankles bilaterally.  Back Exam:  Inspection: Mild loss of lordosis Motion: Flexion 45 deg, Extension 25 deg, Side Bending to 35 deg bilaterally,  Rotation to 45 deg bilaterally  SLR laying: Negative  XSLR laying: Negative  Palpable tenderness: Tender to palpation in the paraspinal musculature of the lumbar spine right greater than left. FABER: Positive right. Sensory change: Gross sensation intact to all lumbar and sacral dermatomes.  Reflexes: 2+ at both patellar tendons, 2+ at achilles tendons, Babinski's downgoing.  Strength at foot  Plantar-flexion: 5/5 Dorsi-flexion: 5/5 Eversion: 5/5 Inversion: 5/5  Leg strength  Quad: 5/5 Hamstring: 5/5 Hip flexor: 5/5 Hip abductors: 5/5  Gait unremarkable.  Osteopathic findings C2 flexed rotated and side bent right T4 extended rotated and side bent left T9 extended rotated and side bent left L2 flexed rotated and side bent right L4 flexed rotated and side bent right Sacrum right on right    Impression and Recommendations:     This case required medical decision making of moderate complexity.      Note: This dictation was prepared with Dragon dictation along with smaller phrase technology. Any transcriptional errors that result from this process are unintentional.        198lb

## 2017-02-03 NOTE — Patient Instructions (Addendum)
Good to see you  Ice 20 minutes 2 times daily. Usually after activity and before bed. Keep it up  Stay active See em again in 6-10 weeks.   I will hold on any xrays today

## 2017-04-05 NOTE — Progress Notes (Signed)
Ray ScaleZach Normand Case D.O. [facility] [address] [place], [place] [address] Phone: [phone] Subjective:    I'm seeing this patient by the request  of:    CC: Ankle and back pain  BJY:NWGNFAOZHYHPI:Subjective  Ray Joycie PeekCyrus is a 52 y.o. male coming in with complaint of  Right ankle pain.  Found to have posterior tibialis tendinitis.  Seems to be doing fairly well. He said that he notices that his right ankle feels different than the left. He continues to wear shoes at all times and has placed an insert in the shoes to help with longitudinal arch support.   Patient was also found to have low back pain.  Found to have L4-L5 spinal listhesis.  Started with home exercises, icing regimen, topical anti-inflammatories and started osteopathic manipulation.  Patient states he continues to do home exercises and notes that on a trip to [place] that he had some back pain with walking for a long period of time.      Past Medical History:  Diagnosis Date  . Achilles tendon rupture 2014   Left  . Complication of anesthesia    Pt had previouds experience of feeling pain after local anesthesia  . Headache(784.0)   . Hyperlipidemia   . Tuberculosis    Pt exposed and medicated  1990's   Past Surgical History:  Procedure Laterality Date  . ACHILLES TENDON SURGERY Left 02/24/2013   Procedure: LEFT ACHILLES TENDON REPAIR;  Surgeon: Cheral AlmasNaiping Michael Xu, MD;  [facility];  Service: Orthopedics;  Laterality: Left;  . FRACTURE SURGERY Right     arm, metal pin   Social History   Socioeconomic History  . Marital status: Single    Spouse name: None  . Number of children: 0  . Years of education: None  . Highest education level: None  Social Needs  . Financial resource strain: None  . Food insecurity - worry: None  . Food insecurity - inability: None  . Transportation needs - medical: None  . Transportation needs - non-medical: None  Occupational History  . Occupation: professor    Employer: [employer]  Tobacco Use  . Smoking status: Former Smoker    Packs/day: 0.25    Years: 8.00    Pack years: 2.00    Types: Cigarettes    Last attempt to quit: 12/27/2013    Years since quitting: 3.2  . Smokeless tobacco: Never Used  . Tobacco comment: pt goes in and out trying to quit  Substance and Sexual Activity  . Alcohol use: No    Alcohol/week: 2.4 oz    Types: 2 Glasses of wine, 2 Shots of liquor per week    Comment: Prior to his Achilles tendon surgery patient was drinking 2-3 alcoholic beverages per day. He has discontinued alcohol use  . Drug use: No    Comment: Patient reports discontinuing marijuana use after his Achilles tendon surgery  . Sexual activity: None  Other Topics Concern  . 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   One caffeinated beverage daily   No Known Allergies Family History  Problem Relation Age of Onset  . Cancer - Lung Mother        Mother died of lung cancer at age 53 (smoker)  . Hypertension Father        Father died in his 8860s secondary to aortic aneurysm. He has history of stroke and was a smoker  . Diverticulosis Sister   . Schizophrenia  Cousin        and nephew  . Obesity Sister        Sister died at age 87 secondary to complications of morbid obesity     Past medical history, social, surgical and family history all reviewed in electronic medical record.  No pertanent information unless stated regarding to the chief complaint.   Review of Systems:Review of systems updated and as accurate as of 04/07/17  No headache, visual changes, nausea, vomiting, diarrhea, constipation, dizziness, abdominal pain, skin rash, fevers, chills, night sweats, weight loss, swollen lymph nodes, body aches, joint swelling, muscle aches, chest pain, shortness of breath, mood changes.   Objective  Blood pressure 122/80, pulse 73, height 6\' 1"  (1.854 m), weight 192 lb (87.1 kg), SpO2 97 %. Systems examined below as of 04/07/17     General: No apparent distress alert and oriented x3 mood and affect normal, dressed appropriately.  HEENT: Pupils equal, extraocular movements intact  Respiratory: Patient's speak in full sentences and does not appear short of breath  Cardiovascular: No lower extremity edema, non tender, no erythema  Skin: Warm dry intact with no signs of infection or rash on extremities or on axial skeleton.  Abdomen: Soft nontender  Neuro: Cranial nerves II through XII are intact, neurovascularly intact in all extremities with 2+ DTRs and 2+ pulses.  Lymph: No lymphadenopathy of posterior or anterior cervical chain or axillae bilaterally.  Gait normal with good balance and coordination.  MSK:  Non tender with full range of motion and good stability and symmetric strength and tone of shoulders, elbows, wrist, hip, and ankles bilaterally.  Neck: Inspection mild loss of lordosis. No palpable stepoffs. Negative Spurling's maneuver. Mild limitation in left-sided rotation and side bending Grip strength and sensation normal in bilateral hands Strength good C4 to T1 distribution No sensory change to C4 to T1 Negative Hoffman sign bilaterally Reflexes normal   Osteopathic findings C2 flexed rotated and side bent right C4 flexed rotated and side bent left C7 flexed rotated and side bent left T3 extended rotated and side bent right inhaled third rib T6 extended rotated and side bent left L2 flexed rotated and side bent right Sacrum right on right     Impression and Recommendations:     This case required medical decision making of moderate complexity.      Note: This dictation was prepared with Dragon dictation along with smaller phrase technology. Any transcriptional errors that result from this process are unintentional.

## 2017-04-07 ENCOUNTER — Encounter: Payer: Self-pay | Admitting: Family Medicine

## 2017-04-07 ENCOUNTER — Ambulatory Visit: Payer: BC Managed Care – PPO | Admitting: Family Medicine

## 2017-04-07 VITALS — BP 122/80 | HR 73 | Ht 73.0 in | Wt 192.0 lb

## 2017-04-07 DIAGNOSIS — G8929 Other chronic pain: Secondary | ICD-10-CM

## 2017-04-07 DIAGNOSIS — M999 Biomechanical lesion, unspecified: Secondary | ICD-10-CM

## 2017-04-07 DIAGNOSIS — M5441 Lumbago with sciatica, right side: Secondary | ICD-10-CM

## 2017-04-07 NOTE — Assessment & Plan Note (Signed)
Discussed with patient at great length.  We discussed HEP, posture and hydration.

## 2017-04-07 NOTE — Patient Instructions (Signed)
Good to see you  Ice is your friend Try to stay hydrated a little more through the day  Consider 1/4 cup Gatorade with your water On wall with heels, butt shoulder and head touching for a goal of 5 minutes daily  Keep up everything else See me again in 6 weeks

## 2017-04-07 NOTE — Assessment & Plan Note (Signed)
Decision today to treat with OMT was based on Physical Exam  After verbal consent patient was treated with HVLA, ME, FPR techniques in cervical, thoracic, rib, lumbar and sacral areas  Patient tolerated the procedure well with improvement in symptoms  Patient given exercises, stretches and lifestyle modifications  See medications in patient instructions if given  Patient will follow up in 6 weeks 

## 2017-05-19 ENCOUNTER — Ambulatory Visit: Payer: BC Managed Care – PPO | Admitting: Family Medicine

## 2017-06-10 ENCOUNTER — Ambulatory Visit: Payer: BC Managed Care – PPO | Admitting: Family Medicine

## 2017-06-13 ENCOUNTER — Ambulatory Visit: Payer: BC Managed Care – PPO | Admitting: Family Medicine

## 2017-06-13 ENCOUNTER — Ambulatory Visit (INDEPENDENT_AMBULATORY_CARE_PROVIDER_SITE_OTHER): Payer: BC Managed Care – PPO

## 2017-06-13 ENCOUNTER — Encounter: Payer: Self-pay | Admitting: Family Medicine

## 2017-06-13 VITALS — BP 118/80 | HR 75 | Ht 73.0 in | Wt 201.5 lb

## 2017-06-13 DIAGNOSIS — M5412 Radiculopathy, cervical region: Secondary | ICD-10-CM

## 2017-06-13 DIAGNOSIS — Z0001 Encounter for general adult medical examination with abnormal findings: Secondary | ICD-10-CM | POA: Diagnosis not present

## 2017-06-13 DIAGNOSIS — E7800 Pure hypercholesterolemia, unspecified: Secondary | ICD-10-CM

## 2017-06-13 DIAGNOSIS — M25521 Pain in right elbow: Secondary | ICD-10-CM

## 2017-06-13 NOTE — Patient Instructions (Addendum)
Cholesterol Cholesterol is a white, waxy, fat-like substance that is needed by the human body in small amounts. The liver makes all the cholesterol we need. Cholesterol is carried from the liver by the blood through the blood vessels. Deposits of cholesterol (plaques) may build up on blood vessel (artery) walls. Plaques make the arteries narrower and stiffer. Cholesterol plaques increase the risk for heart attack and stroke. You cannot feel your cholesterol level even if it is very high. The only way to know that it is high is to have a blood test. Once you know your cholesterol levels, you should keep a record of the test results. Work with your health care provider to keep your levels in the desired range. What do the results mean?  Total cholesterol is a rough measure of all the cholesterol in your blood.  LDL (low-density lipoprotein) is the "bad" cholesterol. This is the type that causes plaque to build up on the artery walls. You want this level to be low.  HDL (high-density lipoprotein) is the "good" cholesterol because it cleans the arteries and carries the LDL away. You want this level to be high.  Triglycerides are fat that the body can either burn for energy or store. High levels are closely linked to heart disease. What are the desired levels of cholesterol?  Total cholesterol below 200.  LDL below 100 for people who are at risk, below 70 for people at very high risk.  HDL above 40 is good. A level of 60 or higher is considered to be protective against heart disease.  Triglycerides below 150. How can I lower my cholesterol? Diet Follow your diet program as told by your health care provider.  Choose fish or white meat chicken and [place], roasted or baked. Limit fatty cuts of red meat, fried foods, and processed meats, such as sausage and lunch meats.  Eat lots of fresh fruits and vegetables.  Choose whole grains, beans, pasta, potatoes, and cereals.  Choose olive oil, corn  oil, or canola oil, and use only small amounts.  Avoid butter, mayonnaise, shortening, or palm kernel oils.  Avoid foods with trans fats.  Drink skim or nonfat milk and eat low-fat or nonfat yogurt and cheeses. Avoid whole milk, cream, ice cream, egg yolks, and full-fat cheeses.  Healthier desserts include angel food cake, ginger snaps, animal crackers, hard candy, popsicles, and low-fat or nonfat frozen yogurt. Avoid pastries, cakes, pies, and cookies.  Exercise  Follow your exercise program as told by your health care provider. A regular program: ? Helps to decrease LDL and raise HDL. ? Helps with weight control.  Do things that increase your activity level, such as gardening, walking, and taking the stairs.  Ask your health care provider about ways that you can be more active in your daily life.  Medicine  Take over-the-counter and prescription medicines only as told by your health care provider. ? Medicine may be prescribed by your health care provider to help lower cholesterol and decrease the risk for heart disease. This is usually done if diet and exercise have failed to bring down cholesterol levels. ? If you have several risk factors, you may need medicine even if your levels are normal.  This information is not intended to replace advice given to you by your health care provider. Make sure you discuss any questions you have with your health care provider. Document Released: 12/11/2000 Document Revised: 10/14/2015 Document Reviewed: 09/16/2015 Elsevier Interactive Patient Education  2018 [place] and  Cholesterol Restricted Diet High levels of fat and cholesterol in your blood may lead to various health problems, such as diseases of the heart, blood vessels, gallbladder, liver, and pancreas. Fats are concentrated sources of energy that come in various forms. Certain types of fat, including saturated fat, may be harmful in excess. Cholesterol is a substance needed by  your body in small amounts. Your body makes all the cholesterol it needs. Excess cholesterol comes from the food you eat. When you have high levels of cholesterol and saturated fat in your blood, health problems can develop because the excess fat and cholesterol will gather along the walls of your blood vessels, causing them to narrow. Choosing the right foods will help you control your intake of fat and cholesterol. This will help keep the levels of these substances in your blood within normal limits and reduce your risk of disease. What is my plan? Your health care provider recommends that you:  Limit your fat intake to ______% or less of your total calories per day.  Limit the amount of cholesterol in your diet to less than _________mg per day.  Eat 20-30 grams of fiber each day.  What types of fat should I choose?  Choose healthy fats more often. Choose monounsaturated and polyunsaturated fats, such as olive and canola oil, flaxseeds, walnuts, almonds, and seeds.  Eat more omega-3 fats. Good choices include salmon, mackerel, sardines, tuna, flaxseed oil, and ground flaxseeds. Aim to eat fish at least two times a week.  Limit saturated fats. Saturated fats are primarily found in animal products, such as meats, butter, and cream. Plant sources of saturated fats include palm oil, palm kernel oil, and coconut oil.  Avoid foods with partially hydrogenated oils in them. These contain trans fats. Examples of foods that contain trans fats are stick margarine, some tub margarines, cookies, crackers, and other baked goods. What general guidelines do I need to follow? These guidelines for healthy eating will help you control your intake of fat and cholesterol:  Check food labels carefully to identify foods with trans fats or high amounts of saturated fat.  Fill one half of your plate with vegetables and green salads.  Fill one fourth of your plate with whole grains. Look for the word "whole" as  the first word in the ingredient list.  Fill one fourth of your plate with lean protein foods.  Limit fruit to two servings a day. Choose fruit instead of juice.  Eat more foods that contain fiber, such as apples, broccoli, carrots, beans, peas, and barley.  Eat more home-cooked food and less restaurant, buffet, and fast food.  Limit or avoid alcohol.  Limit foods high in starch and sugar.  Limit fried foods.  Cook foods using methods other than frying. Baking, boiling, grilling, and broiling are all great options.  Lose weight if you are overweight. Losing just 5-10% of your initial body weight can help your overall health and prevent diseases such as diabetes and heart disease.  What foods can I eat? Grains  Whole grains, such as whole wheat or whole grain breads, crackers, cereals, and pasta. Unsweetened oatmeal, bulgur, barley, quinoa, or brown rice. Corn or whole wheat flour tortillas. Vegetables  Fresh or frozen vegetables (raw, steamed, roasted, or grilled). Green salads. Fruits  All fresh, canned (in natural juice), or frozen fruits. Meats and other protein foods  Ground beef (85% or leaner), grass-fed beef, or beef trimmed of fat. Skinless chicken or [place]. Ground chicken or [place]. Pork  trimmed of fat. All fish and seafood. Eggs. Dried beans, peas, or lentils. Unsalted nuts or seeds. Unsalted canned or dry beans. Dairy  Low-fat dairy products, such as skim or 1% milk, 2% or reduced-fat cheeses, low-fat ricotta or cottage cheese, or plain low-fat yo Fats and oils  Tub margarines without trans fats. Light or reduced-fat mayonnaise and salad dressings. Avocado. Olive, canola, sesame, or safflower oils. Natural peanut or almond butter (choose ones without added sugar and oil). The items listed above may not be a complete list of recommended foods or beverages. Contact your dietitian for more options. Foods to avoid Grains  White bread. White pasta. White rice.  Cornbread. Bagels, pastries, and croissants. Crackers that contain trans fat. Vegetables  White potatoes. Corn. Creamed or fried vegetables. Vegetables in a cheese sauce. Fruits  Dried fruits. Canned fruit in light or heavy syrup. Fruit juice. Meats and other protein foods  Fatty cuts of meat. Ribs, chicken wings, bacon, sausage, bologna, salami, chitterlings, fatback, hot dogs, bratwurst, and packaged luncheon meats. Liver and organ meats. Dairy  Whole or 2% milk, cream, half-and-half, and cream cheese. Whole milk cheeses. Whole-fat or sweetened yogurt. Full-fat cheeses. Nondairy creamers and whipped toppings. Processed cheese, cheese spreads, or cheese curds. Beverages  Alcohol. Sweetened drinks (such as sodas, lemonade, and fruit drinks or punches). Fats and oils  Butter, stick margarine, lard, shortening, ghee, or bacon fat. Coconut, palm kernel, or palm oils. Sweets and desserts  Corn syrup, sugars, honey, and molasses. Candy. Jam and jelly. Syrup. Sweetened cereals. Cookies, pies, cakes, donuts, muffins, and ice cream. The items listed above may not be a complete list of foods and beverages to avoid. Contact your dietitian for more information. This information is not intended to replace advice given to you by your health care provider. Make sure you discuss any questions you have with your health care provider. Document Released: 03/18/2005 Document Revised: 04/08/2014 Document Reviewed: 06/16/2013 Elsevier Interactive Patient Education  2018 [employer].  Health Maintenance, Male A healthy lifestyle and preventive care is important for your health and wellness. Ask your health care provider about what schedule of regular examinations is right for you. What should I know about weight and diet? Eat a Healthy Diet  Eat plenty of vegetables, fruits, whole grains, low-fat dairy products, and lean protein.  Do not eat a lot of foods high in solid fats, added sugars, or  salt.  Maintain a Healthy Weight Regular exercise can help you achieve or maintain a healthy weight. You should:  Do at least 150 minutes of exercise each week. The exercise should increase your heart rate and make you sweat (moderate-intensity exercise).  Do strength-training exercises at least twice a week.  Watch Your Levels of Cholesterol and Blood Lipids  Have your blood tested for lipids and cholesterol every 5 years starting at 53 years of age. If you are at high risk for heart disease, you should start having your blood tested when you are 53 years old. You may need to have your cholesterol levels checked more often if: ? Your lipid or cholesterol levels are high. ? You are older than 53 years of age. ? You are at high risk for heart disease.  What should I know about cancer screening? Many types of cancers can be detected early and may often be prevented. Lung Cancer  You should be screened every year for lung cancer if: ? You are a current smoker who has smoked for at least 30 years. ?  You are a former smoker who has quit within the past 15 years.  Talk to your health care provider about your screening options, when you should start screening, and how often you should be screened.  Colorectal Cancer  Routine colorectal cancer screening usually begins at 53 years of age and should be repeated every 5-10 years until you are 53 years old. You may need to be screened more often if early forms of precancerous polyps or small growths are found. Your health care provider may recommend screening at an earlier age if you have risk factors for colon cancer.  Your health care provider may recommend using home test kits to check for hidden blood in the stool.  A small camera at the end of a tube can be used to examine your colon (sigmoidoscopy or colonoscopy). This checks for the earliest forms of colorectal cancer.  Prostate and Testicular Cancer  Depending on your age and overall  health, your health care provider may do certain tests to screen for prostate and testicular cancer.  Talk to your health care provider about any symptoms or concerns you have about testicular or prostate cancer.  Skin Cancer  Check your skin from head to toe regularly.  Tell your health care provider about any new moles or changes in moles, especially if: ? There is a change in a mole's size, shape, or color. ? You have a mole that is larger than a pencil eraser.  Always use sunscreen. Apply sunscreen liberally and repeat throughout the day.  Protect yourself by wearing long sleeves, pants, a wide-brimmed hat, and sunglasses when outside.  What should I know about heart disease, diabetes, and high blood pressure?  If you are 16-87 years of age, have your blood pressure checked every 3-5 years. If you are 25 years of age or older, have your blood pressure checked every year. You should have your blood pressure measured twice-once when you are at a hospital or clinic, and once when you are not at a hospital or clinic. Record the average of the two measurements. To check your blood pressure when you are not at a hospital or clinic, you can use: ? An automated blood pressure machine at a pharmacy. ? A home blood pressure monitor.  Talk to your health care provider about your target blood pressure.  If you are between 38-72 years old, ask your health care provider if you should take aspirin to prevent heart disease.  Have regular diabetes screenings by checking your fasting blood sugar level. ? If you are at a normal weight and have a low risk for diabetes, have this test once every three years after the age of 17. ? If you are overweight and have a high risk for diabetes, consider being tested at a younger age or more often.  A one-time screening for abdominal aortic aneurysm (AAA) by ultrasound is recommended for men aged 65-75 years who are current or former smokers. What should I know  about preventing infection? Hepatitis B If you have a higher risk for hepatitis B, you should be screened for this virus. Talk with your health care provider to find out if you are at risk for hepatitis B infection. Hepatitis C Blood testing is recommended for:  Everyone born from [birth year] through 1966.  Anyone with known risk factors for hepatitis C.  Sexually Transmitted Diseases (STDs)  You should be screened each year for STDs including gonorrhea and chlamydia if: ? You are sexually active and are  younger than 53 years of age. ? You are older than 53 years of age and your health care provider tells you that you are at risk for this type of infection. ? Your sexual activity has changed since you were last screened and you are at an increased risk for chlamydia or gonorrhea. Ask your health care provider if you are at risk.  Talk with your health care provider about whether you are at high risk of being infected with HIV. Your health care provider may recommend a prescription medicine to help prevent HIV infection.  What else can I do?  Schedule regular health, dental, and eye exams.  Stay current with your vaccines (immunizations).  Do not use any tobacco products, such as cigarettes, chewing tobacco, and e-cigarettes. If you need help quitting, ask your health care provider.  Limit alcohol intake to no more than 2 drinks per day. One drink equals 12 ounces of beer, 5 ounces of wine, or 1 ounces of hard liquor.  Do not use street drugs.  Do not share needles.  Ask your health care provider for help if you need support or information about quitting drugs.  Tell your health care provider if you often feel depressed.  Tell your health care provider if you have ever been abused or do not feel safe at home. This information is not intended to replace advice given to you by your health care provider. Make sure you discuss any questions you have with your health care  provider. Document Released: 09/14/2007 Document Revised: 11/15/2015 Document Reviewed: 12/20/2014 Elsevier Interactive Patient Education  [employer]2018 Elsevier Inc.

## 2017-06-13 NOTE — Progress Notes (Signed)
Subjective:  Patient ID: Ray Case, male    DOB: [birth year]  Age: 52 y.o. MRN: [id number] We did discuss possible treatment of his LDL cholesterol if it remains elevated. CC: Establish Care   HPI Ray Case presents for establishment of care and follow-up and evaluation of his LDL cholesterol that is been elevated 159 in the past.  He is made significant lifestyle changes to include weight loss and moving towards more of lean meat and plant-based diet.  He does not smoke or drink or use illicit drugs.  He works out at [employer] several days a week.  He is currently seeing sports medicine for chronic lower back pain.  He tells of chronic pain in his right elbow status post open reduction internal fixation with placement as a child.  He believes that there may be a retained pen.  He is having of pain shooting down his arm into his right second finger.  He is left-hand dominant.  He does have some pain in his neck with range of motion.  There is no weakness in his upper extremities.  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  He lives with a roommate.  His mother died at age 53 from lung cancer.  His father died at age 53 from a ruptured AAA.  Recently screened neg for hiv. Some early morning urinary hesitancy.   History Ray Case has a past medical history of Achilles tendon rupture (2014), Complication of anesthesia, Headache(784.0), Hyperlipidemia, and Tuberculosis.   He has a past surgical history that includes Fracture surgery (Right) and Achilles tendon surgery (Left, 02/24/2013).   His family history includes Cancer - Lung in his mother; Diverticulosis in his sister; Hypertension in his father; Obesity in his sister; Schizophrenia in his cousin.He reports that he quit smoking about 3 years ago. His smoking use included cigarettes. He has a 2.00 pack-year smoking history. he has never used smokeless tobacco. He reports that he does not drink alcohol or use drugs.  No outpatient medications prior to  visit.   No facility-administered medications prior to visit.     ROS Review of Systems  Constitutional: Negative.   HENT: Negative.   Eyes: Negative for photophobia and visual disturbance.  Respiratory: Negative.   Cardiovascular: Negative.   Gastrointestinal: Negative.   Endocrine: Negative for polyphagia and polyuria.  Genitourinary: Positive for difficulty urinating. Negative for frequency, hematuria and urgency.  Musculoskeletal: Positive for back pain, neck pain and neck stiffness.  Skin: Negative.   Allergic/Immunologic: Negative for immunocompromised state.  Neurological: Negative for headaches.  Hematological: Does not bruise/bleed easily.  Psychiatric/Behavioral: Negative.     Objective:  BP 118/80 (BP Location: Left Arm, Patient Position: Sitting, Cuff Size: Normal)   Pulse 75   Ht 6\' 1"  (1.854 m)   Wt 201 lb 8 oz (91.4 kg)   SpO2 98%   BMI 26.58 kg/m   Physical Exam  Constitutional: He is oriented to person, place, and time. He appears well-developed and well-nourished. No distress.  HENT:  Head: Normocephalic and atraumatic.  Right Ear: External ear normal.  Left Ear: External ear normal.  Mouth/Throat: Oropharynx is clear and moist. No oropharyngeal exudate.  Eyes: Conjunctivae are normal. Pupils are equal, round, and reactive to light. Right eye exhibits no discharge. Left eye exhibits no discharge. No scleral icterus.  Neck: Neck supple. No JVD present. No tracheal deviation present. No thyromegaly present.  Cardiovascular: Normal rate, regular rhythm and normal heart sounds.  Pulmonary/Chest: Effort normal and  breath sounds normal. No stridor.  Abdominal: Soft. Bowel sounds are normal. He exhibits no distension. There is no tenderness. There is no rebound and no guarding.  Genitourinary: Rectal exam shows no external hemorrhoid, no internal hemorrhoid, no fissure, no tenderness, anal tone normal and guaiac negative stool. Prostate is enlarged. Prostate is  not tender.  Musculoskeletal: Normal range of motion. He exhibits no edema or tenderness.       Cervical back: He exhibits normal range of motion, no tenderness and no bony tenderness.       Back:  Lymphadenopathy:    He has no cervical adenopathy.  Neurological: He is alert and oriented to person, place, and time. He has normal strength.  Skin: Skin is warm and dry. He is not diaphoretic.  Psychiatric: He has a normal mood and affect. His behavior is normal.      Assessment & Plan:   Ray Case was seen today for establish care.  Diagnoses and all orders for this visit:  Elevated LDL cholesterol level -     Lipid panel; Future -     TSH; Future  Cervical radiculopathy  Right elbow pain -     DG Elbow Complete Right; Future -     DG Elbow Complete Right -     Ambulatory referral to Orthopedic Surgery  Encounter for health maintenance examination with abnormal findings -     CBC; Future -     Comprehensive metabolic panel; Future -     PSA; Future -     Urinalysis, Routine w reflex microscopic; Future   Ray Case does not currently have medications on file.  No orders of the defined types were placed in this encounter.     Patient fasting for blood work.  He will follow-up with sports medicine doctor for his cervical radiculopathy.  Orthopedic referral for pin removal in the right elbow area.  Follow-up will pend results of his blood work.  Encouraged him to continue his healthy lifestyle.   We discussed possible treatment of his  LDL cholesterol if it remains elevated.  Follow-up: No Follow-up on file.  Mliss Sax, MD

## 2017-06-20 ENCOUNTER — Encounter (INDEPENDENT_AMBULATORY_CARE_PROVIDER_SITE_OTHER): Payer: Self-pay | Admitting: Orthopaedic Surgery

## 2017-06-20 ENCOUNTER — Ambulatory Visit (INDEPENDENT_AMBULATORY_CARE_PROVIDER_SITE_OTHER): Payer: BC Managed Care – PPO | Admitting: Orthopaedic Surgery

## 2017-06-20 DIAGNOSIS — M25521 Pain in right elbow: Secondary | ICD-10-CM

## 2017-06-20 MED ORDER — PREDNISONE 10 MG (21) PO TBPK: ORAL_TABLET | ORAL | 0 refills | Status: DC
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
## 2017-06-24 NOTE — Progress Notes (Signed)
[name] [address] [place], [place] [address] Phone: [phone] Subjective:    I'm seeing this patient by the request  of:    CC: Right elbow pain, left arm pain  BJY:NWGNFAOZHY  Ray Case is a 52 y.o. male coming in with complaint of back pain. His back has been doing the same. Walking does still bother the lower right side. He also has been having tingling in the left hand 2nd finger. He notes tension in the upper left trap and rhomboid. Tingling is intermittent.   Dr. Farris Has took xray of patient's right arm as he was having pain in the arm 2 weeks ago. He lost range of motion for one week.  Patient states that now the pain seems to be doing a little bit better.  Was concerned that a pin removed.  Patient was sent to a orthopedic surgeon.  They did not evaluate it.  X-rays were independently visualized by me showing the patient did have a pain in the soft tissue with a nonhealing medial epicondylar fracture.      Past Medical History:  Diagnosis Date  . Achilles tendon rupture 2014   Left  . Complication of anesthesia    Pt had previouds experience of feeling pain after local anesthesia  . Headache(784.0)   . Hyperlipidemia   . Tuberculosis    Pt exposed and medicated  1990's   Past Surgical History:  Procedure Laterality Date  . ACHILLES TENDON SURGERY Left 02/24/2013   Procedure: LEFT ACHILLES TENDON REPAIR;  Surgeon: Cheral Almas, MD;  Location: [facility] OR;  Service: Orthopedics;  Laterality: Left;  . FRACTURE SURGERY Right     arm, metal pin   Social History   Socioeconomic History  . Marital status: Single    Spouse name: Not on file  . Number of children: 0  . Years of education: Not on file  . Highest education level: Not on file  Occupational History  . Occupation: professor    Employer: [employer]  Social Needs  . Financial resource strain: Not on file  . Food insecurity:    Worry: Not on file    Inability: Not  on file  . Transportation needs:    Medical: Not on file    Non-medical: Not on file  Tobacco Use  . Smoking status: Former Smoker    Packs/day: 0.25    Years: 8.00    Pack years: 2.00    Types: Cigarettes    Last attempt to quit: 12/27/2013    Years since quitting: 3.4  . Smokeless tobacco: Never Used  . Tobacco comment: pt goes in and out trying to quit  Substance and Sexual Activity  . Alcohol use: No    Alcohol/week: 2.4 oz    Types: 2 Glasses of wine, 2 Shots of liquor per week    Comment: Prior to his Achilles tendon surgery patient was drinking 2-3 alcoholic beverages per day. He has discontinued alcohol use  . Drug use: No    Types: Marijuana    Comment: Patient reports discontinuing marijuana use after his Achilles tendon surgery  . Sexual activity: Not on file  Lifestyle  . Physical activity:    Days per week: Not on file    Minutes per session: Not on file  . Stress: Not on file  Relationships  . Social connections:    Talks on phone: Not on file    Gets together: Not on file  Attends religious service: Not on file    Active member of club or organization: Not on file    Attends meetings of clubs or organizations: Not on file    Relationship status: Not on file  Other Topics Concern  . 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   One caffeinated beverage daily   No Known Allergies Family History  Problem Relation Age of Onset  . Cancer - Lung Mother        Mother died of lung cancer at age 80 (smoker)  . Hypertension Father        Father died in his 103s secondary to aortic aneurysm. He has history of stroke and was a smoker  . Diverticulosis Sister   . Schizophrenia Cousin        and nephew  . Obesity Sister        Sister died at age 41 secondary to complications of morbid obesity     Past medical history, social, surgical and family history all reviewed in electronic medical record.  No pertanent information unless  stated regarding to the chief complaint.   Review of Systems:Review of systems updated and as accurate as of 06/25/17  No headache, visual changes, nausea, vomiting, diarrhea, constipation, dizziness, abdominal pain, skin rash, fevers, chills, night sweats, weight loss, swollen lymph nodes, body aches, joint swelling,, chest pain, shortness of breath, mood changes.  Positive muscle aches  Objective  Blood pressure 118/90, pulse 91, height 6\' 1"  (1.854 m), weight 201 lb (91.2 kg), SpO2 96 %. Systems examined below as of 06/25/17   General: No apparent distress alert and oriented x3 mood and affect normal, dressed appropriately.  HEENT: Pupils equal, extraocular movements intact  Respiratory: Patient's speak in full sentences and does not appear short of breath  Cardiovascular: No lower extremity edema, non tender, no erythema  Skin: Warm dry intact with no signs of infection or rash on extremities or on axial skeleton.  Abdomen: Soft nontender  Neuro: Cranial nerves II through XII are intact, neurovascularly intact in all extremities with 2+ DTRs and 2+ pulses.  Lymph: No lymphadenopathy of posterior or anterior cervical chain or axillae bilaterally.  Gait normal with good balance and coordination.  MSK:  Non tender with full range of motion and good stability and symmetric strength and tone of shoulders,  wrist, hip, knee and ankles bilaterally.  Elbow: Right Unremarkable to inspection. Range of motion full pronation, supination, flexion, extension. Strength is full to all of the above directions Stable to varus, valgus stress. Negative moving valgus stress test. Tender over the medial epicondylar region as well as the proximal aspect of the elbow joint medially. Ulnar nerve does not sublux. Negative cubital tunnel Tinel's. Contralateral elbow unremarkable  Neck: Inspection loss of lordosis. No palpable stepoffs. Negative Spurling's maneuver. Mild limitation lacking last 5 degrees of  extension and sidebending Grip strength and sensation normal in bilateral hands Strength good C4 to T1 distribution No sensory change to C4 to T1 Negative Hoffman sign bilaterally Reflexes normal Tightness in the left trapezius  Musculoskeletal ultrasound was performed and interpreted by Terrilee Files D.O.   Elbow:  Patient is tender to palpation there is what appears to be a metal object that is long and linear.  Consistent with the pain noted in the ultrasound.  No erythema, increased Doppler flow or any signs of recent movement.  IMPRESSION: Minimal pain in the distal soft tissue medial aspect of the humerus.  No bony involvement noted.  Osteopathic findings C2 flexed rotated and side bent right C4 flexed rotated and side bent left C7 flexed rotated and side bent left T3 extended rotated and side bent right inhaled third rib T9 extended rotated and side bent left L2 flexed rotated and side bent right Sacrum right on right     Impression and Recommendations:     This case required medical decision making of moderate complexity.      Note: This dictation was prepared with Dragon dictation along with smaller phrase technology. Any transcriptional errors that result from this process are unintentional.

## 2017-06-25 ENCOUNTER — Ambulatory Visit: Payer: BC Managed Care – PPO | Admitting: Family Medicine

## 2017-06-25 ENCOUNTER — Ambulatory Visit: Payer: Self-pay

## 2017-06-25 ENCOUNTER — Encounter: Payer: Self-pay | Admitting: Family Medicine

## 2017-06-25 VITALS — BP 118/90 | HR 91 | Ht 73.0 in | Wt 201.0 lb

## 2017-06-25 DIAGNOSIS — M25522 Pain in left elbow: Secondary | ICD-10-CM

## 2017-06-25 DIAGNOSIS — M542 Cervicalgia: Secondary | ICD-10-CM | POA: Diagnosis not present

## 2017-06-25 DIAGNOSIS — M25521 Pain in right elbow: Secondary | ICD-10-CM | POA: Diagnosis not present

## 2017-06-25 DIAGNOSIS — G8929 Other chronic pain: Secondary | ICD-10-CM

## 2017-06-25 DIAGNOSIS — M999 Biomechanical lesion, unspecified: Secondary | ICD-10-CM

## 2017-06-25 MED ORDER — GABAPENTIN 100 MG PO CAPS: 200.0000 mg | ORAL_CAPSULE | Freq: Every day | ORAL | 3 refills | Status: DC

## 2017-06-25 NOTE — Patient Instructions (Addendum)
Good to see you  Gustavus Bryantce is your friend.  Gabapentin 100-200mg  at night Keep working on the posture See me again in 2 months

## 2017-06-25 NOTE — Assessment & Plan Note (Signed)
Patient likely does have some degenerative disc disease with patient having some in his lower spine.  We discussed that this can cause some radicular symptoms.  Started on gabapentin.  Discussed icing regimen and home exercises.  Discussed which activities of doing which wants to avoid.  Discussed ergonomics.  Encouraged him to take the gabapentin on a regular basis.  Follow-up again in 4 weeks

## 2017-06-25 NOTE — Assessment & Plan Note (Signed)
Patient's ultrasound findings do not show any significant movement.  No erythema or signs of infection.  Pain likely more secondary to the nonhealing fracture that did not maybe have some ulnar nerve irritation.  Discussed with patient again at great length.  Follow-up 4 weeks

## 2017-06-25 NOTE — Assessment & Plan Note (Signed)
Decision today to treat with OMT was based on Physical Exam  After verbal consent patient was treated with HVLA, ME, FPR techniques in cervical, thoracic, lumbar and sacral areas  Patient tolerated the procedure well with improvement in symptoms  Patient given exercises, stretches and lifestyle modifications  See medications in patient instructions if given  Patient will follow up in 4 weeks 

## 2017-08-01 ENCOUNTER — Ambulatory Visit: Payer: BC Managed Care – PPO | Admitting: Family Medicine

## 2017-09-02 ENCOUNTER — Other Ambulatory Visit (INDEPENDENT_AMBULATORY_CARE_PROVIDER_SITE_OTHER): Payer: BC Managed Care – PPO

## 2017-09-02 DIAGNOSIS — E7800 Pure hypercholesterolemia, unspecified: Secondary | ICD-10-CM

## 2017-09-02 DIAGNOSIS — Z0001 Encounter for general adult medical examination with abnormal findings: Secondary | ICD-10-CM | POA: Diagnosis not present

## 2017-09-02 LAB — LIPID PANEL
CHOLESTEROL: 264 mg/dL — AB (ref 0–200)
HDL: 65.9 mg/dL (ref >39.00)
LDL Cholesterol: 181 mg/dL — ABNORMAL HIGH (ref 0–99)
NonHDL: 198.48
TRIGLYCERIDES: 86 mg/dL (ref 0.0–149.0)
Total CHOL/HDL Ratio: 4
VLDL: 17.2 mg/dL (ref 0.0–40.0)

## 2017-09-02 LAB — CBC
HCT: 42.7 % (ref 39.0–52.0)
Hemoglobin: 14.2 g/dL (ref 13.0–17.0)
MCHC: 33.2 g/dL (ref 30.0–36.0)
MCV: 90.1 fl (ref 78.0–100.0)
Platelets: 172 10*3/uL (ref 150.0–400.0)
RBC: 4.74 Mil/uL (ref 4.22–5.81)
RDW: 14.2 % (ref 11.5–15.5)
WBC: 4.2 10*3/uL (ref 4.0–10.5)

## 2017-09-02 LAB — COMPREHENSIVE METABOLIC PANEL
ALBUMIN: 4.1 g/dL (ref 3.5–5.2)
ALT: 15 U/L (ref 0–53)
AST: 14 U/L (ref 0–37)
Alkaline Phosphatase: 51 U/L (ref 39–117)
BILIRUBIN TOTAL: 0.5 mg/dL (ref 0.2–1.2)
BUN: 14 mg/dL (ref 6–23)
CALCIUM: 9.1 mg/dL (ref 8.4–10.5)
CO2: 29 mEq/L (ref 19–32)
CREATININE: 0.89 mg/dL (ref 0.40–1.50)
Chloride: 105 mEq/L (ref 96–112)
GFR: 114.98 mL/min (ref >60.00)
Glucose, Bld: 102 mg/dL — ABNORMAL HIGH (ref 70–99)
Potassium: 4.5 mEq/L (ref 3.5–5.1)
Sodium: 141 mEq/L (ref 135–145)
TOTAL PROTEIN: 6.6 g/dL (ref 6.0–8.3)

## 2017-09-02 LAB — URINALYSIS, ROUTINE W REFLEX MICROSCOPIC
BILIRUBIN URINE: NEGATIVE
Hgb urine dipstick: NEGATIVE
KETONES UR: NEGATIVE
LEUKOCYTES UA: NEGATIVE
Nitrite: NEGATIVE
PH: 6 (ref 5.0–8.0)
RBC / HPF: NONE SEEN (ref 0–?)
Specific Gravity, Urine: 1.025 (ref 1.000–1.030)
TOTAL PROTEIN, URINE-UPE24: NEGATIVE
URINE GLUCOSE: NEGATIVE
UROBILINOGEN UA: 0.2 (ref 0.0–1.0)
WBC, UA: NONE SEEN (ref 0–?)

## 2017-09-02 LAB — TSH: TSH: 0.74 u[IU]/mL (ref 0.35–4.50)

## 2017-09-02 LAB — PSA: PSA: 2.28 ng/mL (ref 0.10–4.00)

## 2017-09-02 NOTE — Progress Notes (Signed)
[facility] [address] [place], [place] [address] Phone: [phone] Subjective:    CC:   Back pain follow-up  BJY:NWGNFAOZHY  Ray Case is a 52 y.o. male coming in with complaint of elbow and back pain. His elbow pain went away since last visit.   Patient's lower back is still feeling that same as last visit. He did have a massage yesterday. Patient feels that the massage helps the tingling in his left arm. Pain in lower back is constant and increases with a lot of walking. Is still using gabapentin but not daily. Sometimes make him sleepy.      Past Medical History:  Diagnosis Date  . Achilles tendon rupture 2014   Left  . Complication of anesthesia    Pt had previouds experience of feeling pain after local anesthesia  . Headache(784.0)   . Hyperlipidemia   . Tuberculosis    Pt exposed and medicated  1990's   Past Surgical History:  Procedure Laterality Date  . ACHILLES TENDON SURGERY Left 02/24/2013   Procedure: LEFT ACHILLES TENDON REPAIR;  Surgeon: Cheral Almas, MD;  Location: [facility] OR;  Service: Orthopedics;  Laterality: Left;  . FRACTURE SURGERY Right     arm, metal pin   Social History   Socioeconomic History  . Marital status: Single    Spouse name: Not on file  . Number of children: 0  . Years of education: Not on file  . Highest education level: Not on file  Occupational History  . Occupation: professor    Employer: [employer]  Social Needs  . Financial resource strain: Not on file  . Food insecurity:    Worry: Not on file    Inability: Not on file  . Transportation needs:    Medical: Not on file    Non-medical: Not on file  Tobacco Use  . Smoking status: Former Smoker    Packs/day: 0.25    Years: 8.00    Pack years: 2.00    Types: Cigarettes    Last attempt to quit: 12/27/2013    Years since quitting: 3.6  . Smokeless tobacco: Never Used  . Tobacco comment: pt goes in and out trying to quit  Substance  and Sexual Activity  . Alcohol use: No    Alcohol/week: 2.4 oz    Types: 2 Glasses of wine, 2 Shots of liquor per week    Comment: Prior to his Achilles tendon surgery patient was drinking 2-3 alcoholic beverages per day. He has discontinued alcohol use  . Drug use: No    Types: Marijuana    Comment: Patient reports discontinuing marijuana use after his Achilles tendon surgery  . Sexual activity: Not on file  Lifestyle  . Physical activity:    Days per week: Not on file    Minutes per session: Not on file  . Stress: Not on file  Relationships  . Social connections:    Talks on phone: Not on file    Gets together: Not on file    Attends religious service: Not on file    Active member of club or organization: Not on file    Attends meetings of clubs or organizations: Not on file    Relationship status: Not on file  Other Topics Concern  . 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   One caffeinated beverage daily   No Known Allergies Family History  Problem Relation Age of Onset  .  Cancer - Lung Mother        Mother died of lung cancer at age 850 (smoker)  . Hypertension Father        Father died in his 4660s secondary to aortic aneurysm. He has history of stroke and was a smoker  . Diverticulosis Sister   . Schizophrenia Cousin        and nephew  . Obesity Sister        Sister died at age 53 secondary to complications of morbid obesity     Past medical history, social, surgical and family history all reviewed in electronic medical record.  No pertanent information unless stated regarding to the chief complaint.   Review of Systems:Review of systems updated and as accurate as of 09/04/17  No headache, visual changes, nausea, vomiting, diarrhea, constipation, dizziness, abdominal pain, skin rash, fevers, chills, night sweats, weight loss, swollen lymph nodes, body aches, joint swelling,  chest pain, shortness of breath, mood changes.  Mild positive  muscle aches  Objective  Blood pressure 128/82, pulse 84, height 6\' 1"  (1.854 m), weight 203 lb (92.1 kg), SpO2 96 %. Systems examined below as of 09/04/17   General: No apparent distress alert and oriented x3 mood and affect normal, dressed appropriately.  HEENT: Pupils equal, extraocular movements intact  Respiratory: Patient's speak in full sentences and does not appear short of breath  Cardiovascular: No lower extremity edema, non tender, no erythema  Skin: Warm dry intact with no signs of infection or rash on extremities or on axial skeleton.  Abdomen: Soft nontender  Neuro: Cranial nerves II through XII are intact, neurovascularly intact in all extremities with 2+ DTRs and 2+ pulses.  Lymph: No lymphadenopathy of posterior or anterior cervical chain or axillae bilaterally.  Gait normal with good balance and coordination.  MSK:  Non tender with full range of motion and good stability and symmetric strength and tone of shoulders, elbows, wrist, hip, knee and ankles bilaterally.  Back Exam:  Inspection: Loss of lordosis Motion: Flexion 30 deg, Extension 20 deg, Side Bending to 35 deg bilaterally,  Rotation to 30 deg bilaterally  SLR laying: Negative  XSLR laying: Negative  Palpable tenderness: Tender to palpation of the paraspinal musculature lumbar spine left greater than right. FABER: Tightness bilaterally. Sensory change: Gross sensation intact to all lumbar and sacral dermatomes.  Reflexes: 2+ at both patellar tendons, 2+ at achilles tendons, Babinski's downgoing.  Strength at foot  Plantar-flexion: 5/5 Dorsi-flexion: 5/5 Eversion: 5/5 Inversion: 5/5  Leg strength  Quad: 5/5 Hamstring: 5/5 Hip flexor: 5/5 Hip abductors: 5/5  Gait unremarkable.  Osteopathic findings C6 flexed rotated and side bent left T3 extended rotated and side bent right inhaled third rib T6 extended rotated and side bent left L2 flexed rotated and side bent right Sacrum right on right       Impression and Recommendations:     This case required medical decision making of moderate complexity.      Note: This dictation was prepared with Dragon dictation along with smaller phrase technology. Any transcriptional errors that result from this process are unintentional.

## 2017-09-03 ENCOUNTER — Encounter: Payer: Self-pay | Admitting: Family Medicine

## 2017-09-04 ENCOUNTER — Encounter: Payer: Self-pay | Admitting: Family Medicine

## 2017-09-04 ENCOUNTER — Ambulatory Visit: Payer: BC Managed Care – PPO | Admitting: Family Medicine

## 2017-09-04 VITALS — BP 128/82 | HR 84 | Ht 73.0 in | Wt 203.0 lb

## 2017-09-04 DIAGNOSIS — M5441 Lumbago with sciatica, right side: Secondary | ICD-10-CM | POA: Diagnosis not present

## 2017-09-04 DIAGNOSIS — G8929 Other chronic pain: Secondary | ICD-10-CM | POA: Diagnosis not present

## 2017-09-04 DIAGNOSIS — M999 Biomechanical lesion, unspecified: Secondary | ICD-10-CM

## 2017-09-04 NOTE — Patient Instructions (Signed)
Good to see you  Read about effexor and we can think about it.  Ice is your friend More isometrics for core See me again in 6-8 weeks

## 2017-09-04 NOTE — Assessment & Plan Note (Signed)
Decision today to treat with OMT was based on Physical Exam  After verbal consent patient was treated with HVLA, ME, FPR techniques in cervical, thoracic, lumbar and sacral areas  Patient tolerated the procedure well with improvement in symptoms  Patient given exercises, stretches and lifestyle modifications  See medications in patient instructions if given  Patient will follow up in 1-2 weeks 

## 2017-09-04 NOTE — Assessment & Plan Note (Signed)
Likely spinal listhesis causing some possible spinal stenosis based on patient's symptoms.  We discussed icing regimen and home exercises.  Discussed which activities to do which wants to avoid.  Discussed icing regimen.  Follow-up again in 8 weeks.  Discussed other medications for possible advanced imaging if necessary.

## 2017-09-05 ENCOUNTER — Telehealth: Payer: Self-pay | Admitting: Family Medicine

## 2017-09-05 NOTE — Telephone Encounter (Signed)
Copied from CRM [phone]#[id number]. Topic: Quick Communication - See Telephone Encounter >> Sep 05, 2017  4:46 PM Arlyss Gandyichardson, Aleyssa Pike N, NT wrote: CRM for notification. See Telephone encounter for: 09/05/17. Pt would like a call to discuss his lab results. Pt will be training 6/10-6/17 until 4pm each day. May leave detailed message on phone.

## 2017-09-08 NOTE — Telephone Encounter (Signed)
Patient is at training until 6/17. Does patient need to work on his diet or is medication being considered at this time?

## 2017-09-08 NOTE — Telephone Encounter (Signed)
I left patient a detailed voicemail with the values of his cholesterol & ldl cholesterol. I advised patient I would be back in touch in regards as to what steps Dr. Doreene BurkeKremer wants to take once I receive his reply back.

## 2017-09-09 NOTE — Telephone Encounter (Signed)
Please ask patient to lower fat and cholesterol in diet and follow up to see me in 3 mos. For a recheck.

## 2017-09-09 NOTE — Telephone Encounter (Signed)
I left patient a detailed message letting him know to work on lowering fat & cholesterol in his diet & to follow up with us in 3 months.

## 2017-09-16 NOTE — Telephone Encounter (Signed)
Pt called back - he would like call back about what next steps need to be as far as his cholesterol is  cb is [phone]

## 2017-09-16 NOTE — Telephone Encounter (Signed)
I left a voicemail for patient to call back. Patient needs to work on lower fat & cholesterol in his diet & follow up with us in 3 months. Okay for triage nurse/pec to go over this info with patient.

## 2017-09-17 NOTE — Telephone Encounter (Signed)
Pt. Returned call and given Dr. Evangeline GulaKremer's instructions.Verbalizes understanding. Appointment made for 3 months.

## 2017-09-17 NOTE — Telephone Encounter (Signed)
Multiple voicemails have been left for patient, mychart message has been sent as well.

## 2017-10-23 ENCOUNTER — Ambulatory Visit: Payer: BC Managed Care – PPO | Admitting: Family Medicine

## 2017-11-17 ENCOUNTER — Telehealth: Payer: Self-pay | Admitting: Family Medicine

## 2017-11-17 MED ORDER — VENLAFAXINE HCL ER 37.5 MG PO CP24: 37.5000 mg | ORAL_CAPSULE | Freq: Every day | ORAL | 1 refills | Status: DC

## 2017-11-17 NOTE — Telephone Encounter (Signed)
Left msg on vmail making pt aware.

## 2017-11-17 NOTE — Telephone Encounter (Signed)
Filled it  Take it daily at 37.5 mg and will see how he is doing at fllow up

## 2017-11-17 NOTE — Telephone Encounter (Signed)
Copied from CRM [id number]. Topic: General - Other >> Nov 17, 2017 12:31 PM Elliot GaultBell, Tiffany M wrote: Relation to pt: self  Call back number:[phone]   Reason for call:  Patient last seen 09/04/17 by Dr. Katrinka BlazingSmith and as per AVS "Read about effexor and we can think about it" . Patient thought about it and would like to proceed with new medication. Patient has a scheduled appointment with Dr. Katrinka BlazingSmith 12/03/17 but unsure if he would like him to start now to discuss at future appointment, please advise

## 2017-12-03 ENCOUNTER — Ambulatory Visit: Payer: BC Managed Care – PPO | Admitting: Family Medicine

## 2017-12-03 ENCOUNTER — Encounter: Payer: Self-pay | Admitting: Family Medicine

## 2017-12-03 VITALS — BP 122/90 | HR 71 | Ht 73.0 in | Wt 202.0 lb

## 2017-12-03 DIAGNOSIS — M545 Low back pain, unspecified: Secondary | ICD-10-CM

## 2017-12-03 DIAGNOSIS — G8929 Other chronic pain: Secondary | ICD-10-CM

## 2017-12-03 DIAGNOSIS — M999 Biomechanical lesion, unspecified: Secondary | ICD-10-CM | POA: Insufficient documentation

## 2017-12-03 NOTE — Patient Instructions (Signed)
Good to see you  Reprinted the note for you  Ice is your friend Keep on working at it stay active See me again in 6ish weeks and enjoy [address]

## 2017-12-03 NOTE — Progress Notes (Signed)
[name] [address] [place], [place] [address] Phone: [phone] Subjective:      CC: Back pain follow-up  BJY:[id number]  Ray Case is a 52 y.o. male coming in with complaint of back pain. States that his back is the same. Has been taking Effexor. Has been exercising and stretching more. Pain with a lot of walking.  Patient has not noticed significant improvement yet with the Effexor.  Discussed icing regimen and home exercises which patient has been doing occasionally.  Some mild tightness more on the lateral aspect of the hip but nothing that stops him from activity.  When patient was walking in [place] seems to have more discomfort and pain.      Past Medical History:  Diagnosis Date  . Achilles tendon rupture 2014   Left  . Complication of anesthesia    Pt had previouds experience of feeling pain after local anesthesia  . Headache(784.0)   . Hyperlipidemia   . Tuberculosis    Pt exposed and medicated  1990's   Past Surgical History:  Procedure Laterality Date  . ACHILLES TENDON SURGERY Left 02/24/2013   Procedure: LEFT ACHILLES TENDON REPAIR;  Surgeon: Cheral Almas, MD;  Location: [facility] OR;  Service: Orthopedics;  Laterality: Left;  . FRACTURE SURGERY Right     arm, metal pin   Social History   Socioeconomic History  . Marital status: Single    Spouse name: Not on file  . Number of children: 0  . Years of education: Not on file  . Highest education level: Not on file  Occupational History  . Occupation: professor    Employer: [employer]  Social Needs  . Financial resource strain: Not on file  . Food insecurity:    Worry: Not on file    Inability: Not on file  . Transportation needs:    Medical: Not on file    Non-medical: Not on file  Tobacco Use  . Smoking status: Former Smoker    Packs/day: 0.25    Years: 8.00    Pack years: 2.00    Types: Cigarettes    Last attempt to quit: 12/27/2013    Years since  quitting: 3.9  . Smokeless tobacco: Never Used  . Tobacco comment: pt goes in and out trying to quit  Substance and Sexual Activity  . Alcohol use: No    Alcohol/week: 4.0 standard drinks    Types: 2 Glasses of wine, 2 Shots of liquor per week    Comment: Prior to his Achilles tendon surgery patient was drinking 2-3 alcoholic beverages per day. He has discontinued alcohol use  . Drug use: No    Types: Marijuana    Comment: Patient reports discontinuing marijuana use after his Achilles tendon surgery  . Sexual activity: Not on file  Lifestyle  . Physical activity:    Days per week: Not on file    Minutes per session: Not on file  . Stress: Not on file  Relationships  . Social connections:    Talks on phone: Not on file    Gets together: Not on file    Attends religious service: Not on file    Active member of club or organization: Not on file    Attends meetings of clubs or organizations: Not on file    Relationship status: Not on file  Other Topics Concern  . 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   One  caffeinated beverage daily   No Known Allergies Family History  Problem Relation Age of Onset  . Cancer - Lung Mother        Mother died of lung cancer at age 80 (smoker)  . Hypertension Father        Father died in his 63s secondary to aortic aneurysm. He has history of stroke and was a smoker  . Diverticulosis Sister   . Schizophrenia Cousin        and nephew  . Obesity Sister        Sister died at age 46 secondary to complications of morbid obesity         Current Outpatient Medications (Other):  .  venlafaxine XR (EFFEXOR XR) 37.5 MG 24 hr capsule, Take 1 capsule (37.5 mg total) by mouth daily with breakfast. .  gabapentin (NEURONTIN) 100 MG capsule, Take 2 capsules (200 mg total) by mouth at bedtime. (Patient not taking: Reported on 12/03/2017)    Past medical history, social, surgical and family history all reviewed in  electronic medical record.  No pertanent information unless stated regarding to the chief complaint.   Review of Systems:  No headache, visual changes, nausea, vomiting, diarrhea, constipation, dizziness, abdominal pain, skin rash, fevers, chills, night sweats, weight loss, swollen lymph nodes, body aches, joint swelling, , chest pain, shortness of breath, mood changes.  Positive muscle aches  Objective  Blood pressure 122/90, pulse 71, height 6\' 1"  (1.854 m), weight 202 lb (91.6 kg), SpO2 95 %.    General: No apparent distress alert and oriented x3 mood and affect normal, dressed appropriately.  HEENT: Pupils equal, extraocular movements intact  Respiratory: Patient's speak in full sentences and does not appear short of breath  Cardiovascular: No lower extremity edema, non tender, no erythema  Skin: Warm dry intact with no signs of infection or rash on extremities or on axial skeleton.  Abdomen: Soft nontender  Neuro: Cranial nerves II through XII are intact, neurovascularly intact in all extremities with 2+ DTRs and 2+ pulses.  Lymph: No lymphadenopathy of posterior or anterior cervical chain or axillae bilaterally.  Gait normal with good balance and coordination.  MSK:  Non tender with full range of motion and good stability and symmetric strength and tone of shoulders, elbows, wrist, hip, knee and ankles bilaterally.  Patient back exam does have some mild loss of lordosis.  Patient does have some tightness of the hip flexors bilaterally right greater than left.  Some mild limited range of motion in extension.  Mild positive tightness of Faber test on the right.  Negative straight leg test.  Deep tendon reflexes intact  Osteopathic findings C3 flexed rotated and side bent right C4 flexed rotated and side bent left T3 extended rotated and side bent right inhaled third rib T5 extended rotated and side bent left L2 flexed rotated and side bent right Sacrum right on right    Impression  and Recommendations:     This case required medical decision making of moderate complexity. The above documentation has been reviewed and is accurate and complete Judi Saa, DO       Note: This dictation was prepared with Dragon dictation along with smaller phrase technology. Any transcriptional errors that result from this process are unintentional.

## 2017-12-03 NOTE — Assessment & Plan Note (Signed)
Spondylolisthesis overall.  We discussed with patient about core strengthening.  Discussed the importance of hip abductor strengthening.  We discussed stretching after walking long distances and proper shoes.  Patient is taking gabapentin and will continue with the Effexor.  Follow-up again in 6 weeks

## 2017-12-03 NOTE — Assessment & Plan Note (Signed)
Decision today to treat with OMT was based on Physical Exam  After verbal consent patient was treated with HVLA, ME, FPR techniques in cervical, thoracic, rib, lumbar and sacral areas  Patient tolerated the procedure well with improvement in symptoms  Patient given exercises, stretches and lifestyle modifications  See medications in patient instructions if given  Patient will follow up in 6 weeks 

## 2017-12-26 ENCOUNTER — Encounter: Payer: Self-pay | Admitting: Family Medicine

## 2017-12-26 ENCOUNTER — Ambulatory Visit: Payer: BC Managed Care – PPO | Admitting: Family Medicine

## 2017-12-26 VITALS — BP 130/80 | HR 71 | Ht 73.0 in | Wt 199.1 lb

## 2017-12-26 DIAGNOSIS — E7800 Pure hypercholesterolemia, unspecified: Secondary | ICD-10-CM

## 2017-12-26 DIAGNOSIS — Z23 Encounter for immunization: Secondary | ICD-10-CM | POA: Diagnosis not present

## 2017-12-26 DIAGNOSIS — Z0000 Encounter for general adult medical examination without abnormal findings: Secondary | ICD-10-CM

## 2017-12-26 LAB — COMPREHENSIVE METABOLIC PANEL
ALK PHOS: 54 U/L (ref 39–117)
ALT: 21 U/L (ref 0–53)
AST: 17 U/L (ref 0–37)
Albumin: 4.4 g/dL (ref 3.5–5.2)
BILIRUBIN TOTAL: 0.6 mg/dL (ref 0.2–1.2)
BUN: 19 mg/dL (ref 6–23)
CALCIUM: 9.6 mg/dL (ref 8.4–10.5)
CO2: 31 meq/L (ref 19–32)
CREATININE: 1 mg/dL (ref 0.40–1.50)
Chloride: 102 mEq/L (ref 96–112)
GFR: 100.39 mL/min (ref >60.00)
Glucose, Bld: 93 mg/dL (ref 70–99)
Potassium: 4.9 mEq/L (ref 3.5–5.1)
Sodium: 139 mEq/L (ref 135–145)
TOTAL PROTEIN: 7.3 g/dL (ref 6.0–8.3)

## 2017-12-26 LAB — LIPID PANEL
Cholesterol: 294 mg/dL — ABNORMAL HIGH (ref 0–200)
HDL: 68.8 mg/dL (ref >39.00)
LDL Cholesterol: 201 mg/dL — ABNORMAL HIGH (ref 0–99)
NonHDL: 224.84
TRIGLYCERIDES: 118 mg/dL (ref 0.0–149.0)
Total CHOL/HDL Ratio: 4
VLDL: 23.6 mg/dL (ref 0.0–40.0)

## 2017-12-26 MED ORDER — ROSUVASTATIN CALCIUM 20 MG PO TABS: 20.0000 mg | ORAL_TABLET | Freq: Every day | ORAL | 2 refills | Status: DC

## 2017-12-26 NOTE — Addendum Note (Signed)
Addended by: Nadene Rubins A on: 12/26/2017 01:10 PM   Modules accepted: Orders

## 2017-12-26 NOTE — Patient Instructions (Signed)
Fat and Cholesterol Restricted Diet High levels of fat and cholesterol in your blood may lead to various health problems, such as diseases of the heart, blood vessels, gallbladder, liver, and pancreas. Fats are concentrated sources of energy that come in various forms. Certain types of fat, including saturated fat, may be harmful in excess. Cholesterol is a substance needed by your body in small amounts. Your body makes all the cholesterol it needs. Excess cholesterol comes from the food you eat. When you have high levels of cholesterol and saturated fat in your blood, health problems can develop because the excess fat and cholesterol will gather along the walls of your blood vessels, causing them to narrow. Choosing the right foods will help you control your intake of fat and cholesterol. This will help keep the levels of these substances in your blood within normal limits and reduce your risk of disease. What is my plan? Your health care provider recommends that you:  Limit your fat intake to ______% or less of your total calories per day.  Limit the amount of cholesterol in your diet to less than _________mg per day.  Eat 20-30 grams of fiber each day.  What types of fat should I choose?  Choose healthy fats more often. Choose monounsaturated and polyunsaturated fats, such as olive and canola oil, flaxseeds, walnuts, almonds, and seeds.  Eat more omega-3 fats. Good choices include salmon, mackerel, sardines, tuna, flaxseed oil, and ground flaxseeds. Aim to eat fish at least two times a week.  Limit saturated fats. Saturated fats are primarily found in animal products, such as meats, butter, and cream. Plant sources of saturated fats include palm oil, palm kernel oil, and coconut oil.  Avoid foods with partially hydrogenated oils in them. These contain trans fats. Examples of foods that contain trans fats are stick margarine, some tub margarines, cookies, crackers, and other baked goods. What  general guidelines do I need to follow? These guidelines for healthy eating will help you control your intake of fat and cholesterol:  Check food labels carefully to identify foods with trans fats or high amounts of saturated fat.  Fill one half of your plate with vegetables and green salads.  Fill one fourth of your plate with whole grains. Look for the word "whole" as the first word in the ingredient list.  Fill one fourth of your plate with lean protein foods.  Limit fruit to two servings a day. Choose fruit instead of juice.  Eat more foods that contain fiber, such as apples, broccoli, carrots, beans, peas, and barley.  Eat more home-cooked food and less restaurant, buffet, and fast food.  Limit or avoid alcohol.  Limit foods high in starch and sugar.  Limit fried foods.  Cook foods using methods other than frying. Baking, boiling, grilling, and broiling are all great options.  Lose weight if you are overweight. Losing just 5-10% of your initial body weight can help your overall health and prevent diseases such as diabetes and heart disease.  What foods can I eat? Grains  Whole grains, such as whole wheat or whole grain breads, crackers, cereals, and pasta. Unsweetened oatmeal, bulgur, barley, quinoa, or brown rice. Corn or whole wheat flour tortillas. Vegetables  Fresh or frozen vegetables (raw, steamed, roasted, or grilled). Green salads. Fruits  All fresh, canned (in natural juice), or frozen fruits. Meats and other protein foods  Ground beef (85% or leaner), grass-fed beef, or beef trimmed of fat. Skinless chicken or turkey. Ground chicken or turkey.   Pork trimmed of fat. All fish and seafood. Eggs. Dried beans, peas, or lentils. Unsalted nuts or seeds. Unsalted canned or dry beans. Dairy  Low-fat dairy products, such as skim or 1% milk, 2% or reduced-fat cheeses, low-fat ricotta or cottage cheese, or plain low-fat yo Fats and oils  Tub margarines without trans  fats. Light or reduced-fat mayonnaise and salad dressings. Avocado. Olive, canola, sesame, or safflower oils. Natural peanut or almond butter (choose ones without added sugar and oil). The items listed above may not be a complete list of recommended foods or beverages. Contact your dietitian for more options. Foods to avoid Grains  White bread. White pasta. White rice. Cornbread. Bagels, pastries, and croissants. Crackers that contain trans fat. Vegetables  White potatoes. Corn. Creamed or fried vegetables. Vegetables in a cheese sauce. Fruits  Dried fruits. Canned fruit in light or heavy syrup. Fruit juice. Meats and other protein foods  Fatty cuts of meat. Ribs, chicken wings, bacon, sausage, bologna, salami, chitterlings, fatback, hot dogs, bratwurst, and packaged luncheon meats. Liver and organ meats. Dairy  Whole or 2% milk, cream, half-and-half, and cream cheese. Whole milk cheeses. Whole-fat or sweetened yogurt. Full-fat cheeses. Nondairy creamers and whipped toppings. Processed cheese, cheese spreads, or cheese curds. Beverages  Alcohol. Sweetened drinks (such as sodas, lemonade, and fruit drinks or punches). Fats and oils  Butter, stick margarine, lard, shortening, ghee, or bacon fat. Coconut, palm kernel, or palm oils. Sweets and desserts  Corn syrup, sugars, honey, and molasses. Candy. Jam and jelly. Syrup. Sweetened cereals. Cookies, pies, cakes, donuts, muffins, and ice cream. The items listed above may not be a complete list of foods and beverages to avoid. Contact your dietitian for more information. This information is not intended to replace advice given to you by your health care provider. Make sure you discuss any questions you have with your health care provider. Document Released: 03/18/2005 Document Revised: 04/08/2014 Document Reviewed: 06/16/2013 Elsevier Interactive Patient Education  2018 Elsevier Inc.  

## 2017-12-26 NOTE — Progress Notes (Addendum)
Subjective:  Patient ID: Ray Case, male    DOB: Jan 06, 1966  Age: 52 y.o. MRN: [id number]  CC: Follow-up   HPI Ray Case presents for follow-up of his LDL cholesterol.  He continues to work out in [employer] on his core to help his lower back pain.  Effexor is helping to some extent.  He feels more focused at work.  He has discontinued caffeine in his diet and entirely.  He did develop a mild headache for a couple of days.  He thought that maybe this was affecting his sleep.  Sleep has improved somewhat he including his urine flow.  He has decreased the fat and cholesterol in his diet.  He is doing emphasizing meat in his diet.  He does enjoy wine on a regular basis.  Outpatient Medications Prior to Visit  Medication Sig Dispense Refill  . venlafaxine XR (EFFEXOR XR) 37.5 MG 24 hr capsule Take 1 capsule (37.5 mg total) by mouth daily with breakfast. 30 capsule 1  . gabapentin (NEURONTIN) 100 MG capsule Take 2 capsules (200 mg total) by mouth at bedtime. (Patient not taking: Reported on 12/26/2017) 60 capsule 3   No facility-administered medications prior to visit.     ROS Review of Systems  Constitutional: Negative.   Respiratory: Negative.   Cardiovascular: Negative.   Gastrointestinal: Negative.   Musculoskeletal: Positive for back pain. Negative for gait problem and joint swelling.  Allergic/Immunologic: Negative for immunocompromised state.  Neurological: Negative for numbness and headaches.  Hematological: Does not bruise/bleed easily.  Psychiatric/Behavioral: Negative.     Objective:  BP 130/80   Pulse 71   Ht 6\' 1"  (1.854 m)   Wt 199 lb 2 oz (90.3 kg)   SpO2 96%   BMI 26.27 kg/m   BP Readings from Last 3 Encounters:  12/26/17 130/80  12/03/17 122/90  09/04/17 128/82    Wt Readings from Last 3 Encounters:  12/26/17 199 lb 2 oz (90.3 kg)  12/03/17 202 lb (91.6 kg)  09/04/17 203 lb (92.1 kg)    Physical Exam  Constitutional: He is oriented to person, place,  and time. He appears well-developed and well-nourished. No distress.  HENT:  Head: Normocephalic and atraumatic.  Right Ear: External ear normal.  Eyes: Right eye exhibits no discharge. Left eye exhibits no discharge. No scleral icterus.  Pulmonary/Chest: Effort normal.  Neurological: He is alert and oriented to person, place, and time.  Skin: Skin is warm and dry. He is not diaphoretic.  Psychiatric: He has a normal mood and affect. His behavior is normal.    Lab Results  Component Value Date   WBC 4.2 09/02/2017   HGB 14.2 09/02/2017   HCT 42.7 09/02/2017   PLT 172.0 09/02/2017   GLUCOSE 93 12/26/2017   CHOL 294 (H) 12/26/2017   TRIG 118.0 12/26/2017   HDL 68.80 12/26/2017   LDLDIRECT 188.1 04/09/2013   LDLCALC 201 (H) 12/26/2017   ALT 21 12/26/2017   AST 17 12/26/2017   NA 139 12/26/2017   K 4.9 12/26/2017   CL 102 12/26/2017   CREATININE 1.00 12/26/2017   BUN 19 12/26/2017   CO2 31 12/26/2017   TSH 0.74 09/02/2017   PSA 2.28 09/02/2017   HGBA1C 5.9 08/04/2009    Dg Lumbar Spine 2-3 Views  Result Date: 12/30/2016 CLINICAL DATA:  Chronic low back pain. EXAM: LUMBAR SPINE - 2-3 VIEW COMPARISON:  11/03/2008 FINDINGS: There are 5 nonrib bearing lumbar-type vertebral bodies. The vertebral body heights are maintained. There  is grade 1 anterolisthesis of L4 on L5 secondary to bilateral L4 pars interarticularis defects. The alignment is otherwise anatomic. There is no acute fracture. There is degenerative disc disease with disc height loss at L4-5 and L5-S1. There is bilateral facet arthropathy at L4-5 and L5-S1. The SI joints are unremarkable. IMPRESSION: 1. Stable grade 1 anterolisthesis of L4 on L5 secondary to bilateral L4 pars interarticularis defects. 2. Degenerative disc disease with disc height loss at L4-5 and L5-S1. Electronically Signed   By: Elige Ko   On: 12/30/2016 13:39    Assessment & Plan:   Ray Case was seen today for follow-up.  Diagnoses and all orders for  this visit:  Elevated LDL cholesterol level -     Lipid panel -     Comprehensive metabolic panel -     rosuvastatin (CRESTOR) 20 MG tablet; Take 1 tablet (20 mg total) by mouth daily.  Healthcare maintenance -     Flu Vaccine QUAD 6+ mos PF IM (Fluarix Quad PF)  Need for influenza vaccination   I have discontinued Ray Case's gabapentin. I am also having him start on rosuvastatin. Additionally, I am having him maintain his venlafaxine XR.  Meds ordered this encounter  Medications  . rosuvastatin (CRESTOR) 20 MG tablet    Sig: Take 1 tablet (20 mg total) by mouth daily.    Dispense:  30 tablet    Refill:  2   He will continue a low-fat low-cholesterol diet.  We discussed a statin if his LDL remains elevated.  Discussed using red wine to help elevate his HDL levels.  Discussed also consuming no more than 2 glasses of wine per day for heart and general health and well-being.  Follow-up: Return in about 3 months (around 03/27/2018).  Mliss Sax, MD

## 2018-01-14 ENCOUNTER — Other Ambulatory Visit: Payer: Self-pay | Admitting: Family Medicine

## 2018-01-14 NOTE — Telephone Encounter (Signed)
Refill done.  

## 2018-01-25 NOTE — Progress Notes (Signed)
[facility] [address] [place], [place] [address] Phone: [phone] Subjective:    I Ray Case am serving as a Neurosurgeon for Dr. Antoine Primas.    CC: Back pain follow-up  [id number]  Ray Case is a 52 y.o. male coming in with complaint of back pain. Back feels the same.   Patient does have spinal listhesis.  Responding fairly well to manipulation.  Worsening pain overall low.  Rates the severity pain is 7 out of 10.  Feels like if he could have a brace at the end of long days he would feel better.  Patient states sometimes intermittent pain going down the leg but nothing that seems to stay chronic.      Past Medical History:  Diagnosis Date  . Achilles tendon rupture 2014   Left  . Complication of anesthesia    Pt had previouds experience of feeling pain after local anesthesia  . Headache(784.0)   . Hyperlipidemia   . Tuberculosis    Pt exposed and medicated  1990's   Past Surgical History:  Procedure Laterality Date  . ACHILLES TENDON SURGERY Left 02/24/2013   Procedure: LEFT ACHILLES TENDON REPAIR;  Surgeon: Cheral Almas, MD;  Location: [facility] OR;  Service: Orthopedics;  Laterality: Left;  . FRACTURE SURGERY Right     arm, metal pin   Social History   Socioeconomic History  . Marital status: Single    Spouse name: Not on file  . Number of children: 0  . Years of education: Not on file  . Highest education level: Not on file  Occupational History  . Occupation: professor    Employer: [employer]  Social Needs  . Financial resource strain: Not on file  . Food insecurity:    Worry: Not on file    Inability: Not on file  . Transportation needs:    Medical: Not on file    Non-medical: Not on file  Tobacco Use  . Smoking status: Former Smoker    Packs/day: 0.25    Years: 8.00    Pack years: 2.00    Types: Cigarettes    Last attempt to quit: 12/27/2013    Years since quitting: 4.0  . Smokeless tobacco: Never Used    . Tobacco comment: pt goes in and out trying to quit  Substance and Sexual Activity  . Alcohol use: No    Alcohol/week: 4.0 standard drinks    Types: 2 Glasses of wine, 2 Shots of liquor per week    Comment: Prior to his Achilles tendon surgery patient was drinking 2-3 alcoholic beverages per day. He has discontinued alcohol use  . Drug use: No    Types: Marijuana    Comment: Patient reports discontinuing marijuana use after his Achilles tendon surgery  . Sexual activity: Not on file  Lifestyle  . Physical activity:    Days per week: Not on file    Minutes per session: Not on file  . Stress: Not on file  Relationships  . Social connections:    Talks on phone: Not on file    Gets together: Not on file    Attends religious service: Not on file    Active member of club or organization: Not on file    Attends meetings of clubs or organizations: Not on file    Relationship status: Not on file  Other Topics Concern  . 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  One caffeinated beverage daily   No Known Allergies Family History  Problem Relation Age of Onset  . Cancer - Lung Mother        Mother died of lung cancer at age 44 (smoker)  . Hypertension Father        Father died in his 44s secondary to aortic aneurysm. He has history of stroke and was a smoker  . Diverticulosis Sister   . Schizophrenia Cousin        and nephew  . Obesity Sister        Sister died at age 70 secondary to complications of morbid obesity     Current Outpatient Medications (Cardiovascular):  .  rosuvastatin (CRESTOR) 20 MG tablet, Take 1 tablet (20 mg total) by mouth daily.        Past medical history, social, surgical and family history all reviewed in electronic medical record.  No pertanent information unless stated regarding to the chief complaint.   Review of Systems:  No headache, visual changes, nausea, vomiting, diarrhea, constipation, dizziness, abdominal  pain, skin rash, fevers, chills, night sweats, weight loss, swollen lymph nodes, body aches, joint swelling, , chest pain, shortness of breath, mood changes.  Positive muscle aches  Objective  Blood pressure 100/70, pulse 75, height 6\' 1"  (1.854 m), weight 206 lb (93.4 kg), SpO2 93 %.   General: No apparent distress alert and oriented x3 mood and affect normal, dressed appropriately.  HEENT: Pupils equal, extraocular movements intact  Respiratory: Patient's speak in full sentences and does not appear short of breath  Cardiovascular: No lower extremity edema, non tender, no erythema  Skin: Warm dry intact with no signs of infection or rash on extremities or on axial skeleton.  Abdomen: Soft nontender  Neuro: Cranial nerves II through XII are intact, neurovascularly intact in all extremities with 2+ DTRs and 2+ pulses.  Lymph: No lymphadenopathy of posterior or anterior cervical chain or axillae bilaterally.  Gait normal with good balance and coordination.  MSK:  Non tender with full range of motion and good stability and symmetric strength and tone of shoulders, elbows, wrist, hip, knee and ankles bilaterally.  Back Exam:  Inspection: Unremarkable  Motion: Flexion 45 deg, Extension 25 deg with worsening pain, Side Bending to 45 deg bilaterally,  Rotation to 45 deg bilaterally  SLR laying: Negative  XSLR laying: Negative  Palpable tenderness: Tender to palpation the paraspinal musculature lumbar spine. FABER: Mild tightness bilaterally. Sensory change: Gross sensation intact to all lumbar and sacral dermatomes.  Reflexes: 2+ at both patellar tendons, 2+ at achilles tendons, Babinski's downgoing.  Strength at foot  Plantar-flexion: 5/5 Dorsi-flexion: 5/5 Eversion: 5/5 Inversion: 5/5  Leg strength  Quad: 5/5 Hamstring: 5/5 Hip flexor: 5/5 Hip abductors: 5/5  Gait unremarkable.  Osteopathic findings C2 flexed rotated and side bent right T3 extended rotated and side bent right inhaled  third rib T9 extended rotated and side bent left L2 flexed rotated and side bent right Sacrum right on right     Impression and Recommendations:     This case required medical decision making of moderate complexity. The above documentation has been reviewed and is accurate and complete Judi Saa, DO       Note: This dictation was prepared with Dragon dictation along with smaller phrase technology. Any transcriptional errors that result from this process are unintentional.

## 2018-01-26 ENCOUNTER — Ambulatory Visit: Payer: BC Managed Care – PPO | Admitting: Family Medicine

## 2018-01-26 ENCOUNTER — Encounter: Payer: Self-pay | Admitting: Family Medicine

## 2018-01-26 VITALS — BP 100/70 | HR 75 | Ht 73.0 in | Wt 206.0 lb

## 2018-01-26 DIAGNOSIS — G8929 Other chronic pain: Secondary | ICD-10-CM | POA: Diagnosis not present

## 2018-01-26 DIAGNOSIS — M999 Biomechanical lesion, unspecified: Secondary | ICD-10-CM | POA: Diagnosis not present

## 2018-01-26 DIAGNOSIS — M545 Low back pain, unspecified: Secondary | ICD-10-CM

## 2018-01-26 MED ORDER — VENLAFAXINE HCL ER 75 MG PO CP24: 75.0000 mg | ORAL_CAPSULE | Freq: Every day | ORAL | 3 refills | Status: DC

## 2018-01-26 NOTE — Patient Instructions (Addendum)
Good to see you  Effexor 75mg  daily  Ice is your friend Heel lift 1/8 inch in your left shoe Consider a calf compression sleeve We will call you on the back brace  See me again in 5-6 weeks

## 2018-01-26 NOTE — Assessment & Plan Note (Signed)
Decision today to treat with OMT was based on Physical Exam  After verbal consent patient was treated with HVLA, ME, FPR techniques in cervical, thoracic, rib,  lumbar and sacral areas  Patient tolerated the procedure well with improvement in symptoms  Patient given exercises, stretches and lifestyle modifications  See medications in patient instructions if given  Patient will follow up in 4-8 weeks 

## 2018-01-26 NOTE — Assessment & Plan Note (Signed)
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  Intermittent radicular symptoms.  Discussed with patient again in great length.  Patient would like to consider a brace.  Refill Effexor at higher dose See how patient responds.  We will get patient the back brace as well but only to wear intermittently.  Discussed icing regimen and home exercises.  Responding well to manipulation and follow-up again in 1 to 2 months.

## 2018-02-17 ENCOUNTER — Other Ambulatory Visit: Payer: Self-pay | Admitting: Family Medicine

## 2018-02-17 DIAGNOSIS — E7800 Pure hypercholesterolemia, unspecified: Secondary | ICD-10-CM

## 2018-04-17 ENCOUNTER — Other Ambulatory Visit: Payer: Self-pay | Admitting: Family Medicine

## 2018-05-23 ENCOUNTER — Other Ambulatory Visit: Payer: Self-pay | Admitting: Family Medicine

## 2018-05-23 DIAGNOSIS — E7800 Pure hypercholesterolemia, unspecified: Secondary | ICD-10-CM

## 2018-06-30 ENCOUNTER — Telehealth: Payer: Self-pay

## 2018-06-30 NOTE — Telephone Encounter (Signed)
Refill request sent to the office for Effexor. Left message for patient to call back to schedule a WebEx conference with Dr. Katrinka Blazing in order to obtain refill.

## 2018-06-30 NOTE — Progress Notes (Signed)
[facility] [address] [place], [place] [address] Phone: [phone] Subjective:    Virtual Visit via Video Note  I connected with Ray Case on 06/30/18 at  9:00 AM EDT by a video enabled telemedicine application and verified that I am speaking with the correct person using two identifiers.   I discussed the limitations of evaluation and management by telemedicine and the availability of in person appointments. The patient expressed understanding and agreed to proceed.      I discussed the assessment and treatment plan with the patient. The patient was provided an opportunity to ask questions and all were answered. The patient agreed with the plan and demonstrated an understanding of the instructions.   The patient was advised to call back or seek an in-person evaluation if the symptoms worsen or if the condition fails to improve as anticipated.  Spent  25 minutes with patientand had greater than 50% of counseling including as described above in assessment and plan.  We did discuss some patient with no medication side effects, we discussed the home exercises core strengthening and stability as well.   Ray Saa, DO    CC: Back pain follow-up  [id number]  Ray Case is a 53 y.o. male coming in with complaint of back pain follow-up.  Has been found to have tight psoas muscles, does have degenerative disc disease.  Has been doing home exercises, patient has been increasing activity and has been doing more yoga.  Feels like that is beneficial.  New onset pain though seems to be on the posterior aspect of the knee.  Sometimes feels like his knee is stuck in a flexed position after sitting a long amount of time.  Patient states activity seems to make it better.       Past Medical History:  Diagnosis Date  . Achilles tendon rupture 2014   Left  . Complication of anesthesia    Pt had previouds experience of feeling pain after local  anesthesia  . Headache(784.0)   . Hyperlipidemia   . Tuberculosis    Pt exposed and medicated  1990's   Past Surgical History:  Procedure Laterality Date  . ACHILLES TENDON SURGERY Left 02/24/2013   Procedure: LEFT ACHILLES TENDON REPAIR;  Surgeon: Cheral Almas, MD;  Location: [facility] OR;  Service: Orthopedics;  Laterality: Left;  . FRACTURE SURGERY Right     arm, metal pin   Social History   Socioeconomic History  . Marital status: Single    Spouse name: Not on file  . Number of children: 0  . Years of education: Not on file  . Highest education level: Not on file  Occupational History  . Occupation: professor    Employer: [employer]  Social Needs  . Financial resource strain: Not on file  . Food insecurity:    Worry: Not on file    Inability: Not on file  . Transportation needs:    Medical: Not on file    Non-medical: Not on file  Tobacco Use  . Smoking status: Former Smoker    Packs/day: 0.25    Years: 8.00    Pack years: 2.00    Types: Cigarettes    Last attempt to quit: 12/27/2013    Years since quitting: 4.5  . Smokeless tobacco: Never Used  . Tobacco comment: pt goes in and out trying to quit  Substance and Sexual Activity  . Alcohol use: No    Alcohol/week: 4.0 standard drinks  Types: 2 Glasses of wine, 2 Shots of liquor per week    Comment: Prior to his Achilles tendon surgery patient was drinking 2-3 alcoholic beverages per day. He has discontinued alcohol use  . Drug use: No    Types: Marijuana    Comment: Patient reports discontinuing marijuana use after his Achilles tendon surgery  . Sexual activity: Not on file  Lifestyle  . Physical activity:    Days per week: Not on file    Minutes per session: Not on file  . Stress: Not on file  Relationships  . Social connections:    Talks on phone: Not on file    Gets together: Not on file    Attends religious service: Not on file    Active member of club or organization: Not on file    Attends  meetings of clubs or organizations: Not on file    Relationship status: Not on file  Other Topics Concern  . 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   One caffeinated beverage daily   No Known Allergies Family History  Problem Relation Age of Onset  . Cancer - Lung Mother        Mother died of lung cancer at age 24 (smoker)  . Hypertension Father        Father died in his 62s secondary to aortic aneurysm. He has history of stroke and was a smoker  . Diverticulosis Sister   . Schizophrenia Cousin        and nephew  . Obesity Sister        Sister died at age 73 secondary to complications of morbid obesity     Current Outpatient Medications (Cardiovascular):  .  rosuvastatin (CRESTOR) 20 MG tablet, TAKE 1 TABLET BY MOUTH EVERY DAY     Current Outpatient Medications (Other):  .  venlafaxine XR (EFFEXOR XR) 75 MG 24 hr capsule, Take 1 capsule (75 mg total) by mouth daily with breakfast. .  venlafaxine XR (EFFEXOR-XR) 37.5 MG 24 hr capsule, TAKE ONE CAPSULE BY MOUTH DAILY WITH BREAKFAST    Past medical history, social, surgical and family history all reviewed in electronic medical record.  No pertanent information unless stated regarding to the chief complaint.   Review of Systems:  No headache, visual changes, nausea, vomiting, diarrhea, constipation, dizziness, abdominal pain, skin rash, fevers, chills, night sweats, weight loss, swollen lymph nodes, body aches, joint swelling, chest pain, shortness of breath, mood changes.  Mild positive muscle aches  Objective     General: No apparent distress alert and oriented x3 mood and affect normal, dressed appropriately.  HEENT: Pupils equal, extraocular movements intact  Respiratory: Patient's speak in full sentences and does not appear short of breath  Patient did not stand up and show his knee.  Full extension and flexion noted.     Impression and Recommendations:     . The above  documentation has been reviewed and is accurate and complete Ray Saa, DO       Note: This dictation was prepared with Dragon dictation along with smaller phrase technology. Any transcriptional errors that result from this process are unintentional.

## 2018-07-01 ENCOUNTER — Encounter: Payer: Self-pay | Admitting: Family Medicine

## 2018-07-01 ENCOUNTER — Ambulatory Visit (INDEPENDENT_AMBULATORY_CARE_PROVIDER_SITE_OTHER): Payer: BC Managed Care – PPO | Admitting: Family Medicine

## 2018-07-01 DIAGNOSIS — G8929 Other chronic pain: Secondary | ICD-10-CM

## 2018-07-01 DIAGNOSIS — M545 Low back pain, unspecified: Secondary | ICD-10-CM

## 2018-07-01 DIAGNOSIS — M76892 Other specified enthesopathies of left lower limb, excluding foot: Secondary | ICD-10-CM | POA: Insufficient documentation

## 2018-07-01 MED ORDER — VENLAFAXINE HCL ER 75 MG PO CP24: 75.0000 mg | ORAL_CAPSULE | Freq: Every day | ORAL | 3 refills | Status: DC

## 2018-07-01 NOTE — Assessment & Plan Note (Signed)
Chronic low back pain.  Discussed with patient in great length about home exercises, icing regimen, which activities to do.  Responding well to the Effexor.  12-month supply at 75 mg given.  Patient concern for the use of this long-term.  Patient will continue the home exercise.  Seems to have more of a hamstring tendinitis.  New exercises given in email as well.  Follow-up again virtual in 4 weeks

## 2018-07-01 NOTE — Assessment & Plan Note (Signed)
Hamstring tendinitis left side, discussed with patient in great length.  We discussed compression and how this can change the friction in certain areas home exercises emailed to patient.  Follow-up again in 4 weeks

## 2018-07-29 ENCOUNTER — Ambulatory Visit (INDEPENDENT_AMBULATORY_CARE_PROVIDER_SITE_OTHER): Payer: BC Managed Care – PPO | Admitting: Family Medicine

## 2018-07-29 ENCOUNTER — Encounter: Payer: Self-pay | Admitting: Family Medicine

## 2018-07-29 DIAGNOSIS — M545 Low back pain, unspecified: Secondary | ICD-10-CM

## 2018-07-29 DIAGNOSIS — G8929 Other chronic pain: Secondary | ICD-10-CM

## 2018-07-29 NOTE — Assessment & Plan Note (Signed)
Good, doing well No change, f/u prn

## 2018-07-29 NOTE — Progress Notes (Signed)
Tawana ScaleZach Duanna Case D.O. [facility] [address] [place], [place] [id number] Phone: [phone] Subjective:    Virtual Visit via Video Note  I connected with Ray Case on 07/29/18 at  9:45 AM EDT by a video enabled telemedicine application and verified that I am speaking with the correct person using two identifiers.   I discussed the limitations of evaluation and management by telemedicine and the availability of in person appointments. The patient expressed understanding and agreed to proceed.  Patient was in his home residence and I was on my office setting for the virtual platform.    I discussed the assessment and treatment plan with the patient. The patient was provided an opportunity to ask questions and all were answered. The patient agreed with the plan and demonstrated an understanding of the instructions.   The patient was advised to call back or seek an in-person evaluation if the symptoms worsen or if the condition fails to improve as anticipated.  I provided 22 minutes of face-to-face time during this encounter.   Judi SaaZachary M Jacalyn Biggs, DO    CC: Back pain follow-up  [id number]:Subjective  Ray RaringDuane Joycie PeekCyrus is a 53 y.o. male coming in with complaint of back pain and hamstring pain follow-up.  Patient has been doing yoga on a daily basis and feels like it is been significantly beneficial.  Patient has been doing hip abductor strengthening as well as stretching the hamstring and is making significant differences.  Not doing the prescribed exercises as much.  But feeling much better overall.  Rates the severity of pain still more of a 1 out of 10.  Happy with the results so far.     Past Medical History:  Diagnosis Date  . Achilles tendon rupture 2014   Left  . Complication of anesthesia    Pt had previouds experience of feeling pain after local anesthesia  . Headache(784.0)   . Hyperlipidemia   . Tuberculosis    Pt exposed and medicated  1990's   Past Surgical History:   Procedure Laterality Date  . ACHILLES TENDON SURGERY Left 02/24/2013   Procedure: LEFT ACHILLES TENDON REPAIR;  Surgeon: Cheral AlmasNaiping Michael Xu, MD;  [facility] OR;  Service: Orthopedics;  Laterality: Left;  . FRACTURE SURGERY Right     arm, metal pin   Social History   Socioeconomic History  . Marital status: Single    Spouse name: Not on file  . Number of children: 0  . Years of education: Not on file  . Highest education level: Not on file  Occupational History  . Occupation: professor    Employer: [employer]  Social Needs  . Financial resource strain: Not on file  . Food insecurity:    Worry: Not on file    Inability: Not on file  . Transportation needs:    Medical: Not on file    Non-medical: Not on file  Tobacco Use  . Smoking status: Former Smoker    Packs/day: 0.25    Years: 8.00    Pack years: 2.00    Types: Cigarettes    Last attempt to quit: 12/27/2013    Years since quitting: 4.5  . Smokeless tobacco: Never Used  . Tobacco comment: pt goes in and out trying to quit  Substance and Sexual Activity  . Alcohol use: No    Alcohol/week: 4.0 standard drinks    Types: 2 Glasses of wine, 2 Shots of liquor per week    Comment: Prior to his Achilles tendon  surgery patient was drinking 2-3 alcoholic beverages per day. He has discontinued alcohol use  . Drug use: No    Types: Marijuana    Comment: Patient reports discontinuing marijuana use after his Achilles tendon surgery  . Sexual activity: Not on file  Lifestyle  . Physical activity:    Days per week: Not on file    Minutes per session: Not on file  . Stress: Not on file  Relationships  . Social connections:    Talks on phone: Not on file    Gets together: Not on file    Attends religious service: Not on file    Active member of club or organization: Not on file    Attends meetings of clubs or organizations: Not on file    Relationship status: Not on file  Other Topics Concern  . 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   One caffeinated beverage daily   No Known Allergies Family History  Problem Relation Age of Onset  . Cancer - Lung Mother        Mother died of lung cancer at age 62 (smoker)  . Hypertension Father        Father died in his 82s secondary to aortic aneurysm. He has history of stroke and was a smoker  . Diverticulosis Sister   . Schizophrenia Cousin        and nephew  . Obesity Sister        Sister died at age 82 secondary to complications of morbid obesity     Current Outpatient Medications (Cardiovascular):  .  rosuvastatin (CRESTOR) 20 MG tablet, TAKE 1 TABLET BY MOUTH EVERY DAY     Current Outpatient Medications (Other):  .  venlafaxine XR (EFFEXOR XR) 75 MG 24 hr capsule, Take 1 capsule (75 mg total) by mouth daily with breakfast. .  venlafaxine XR (EFFEXOR-XR) 37.5 MG 24 hr capsule, TAKE ONE CAPSULE BY MOUTH DAILY WITH BREAKFAST    Past medical history, social, surgical and family history all reviewed in electronic medical record.  No pertanent information unless stated regarding to the chief complaint.   Review of Systems:  No headache, visual changes, nausea, vomiting, diarrhea, constipation, dizziness, abdominal pain, skin rash, fevers, chills, night sweats, weight loss, swollen lymph nodes, body aches, joint swelling, muscle aches, chest pain, shortness of breath, mood changes.   Objective     General: No apparent distress alert and oriented x3 mood and affect normal, dressed appropriately.  tone of shoulders, elbows, wrist, hip, knee and ankles bilaterally.     Impression and Recommendations:     . The above documentation has been reviewed and is accurate and complete Judi Saa, DO       Note: This dictation was prepared with Dragon dictation along with smaller phrase technology. Any transcriptional errors that result from this process are unintentional.

## 2018-07-31 ENCOUNTER — Ambulatory Visit (INDEPENDENT_AMBULATORY_CARE_PROVIDER_SITE_OTHER): Payer: BC Managed Care – PPO | Admitting: Family Medicine

## 2018-07-31 ENCOUNTER — Encounter: Payer: Self-pay | Admitting: Family Medicine

## 2018-07-31 VITALS — Ht 73.0 in

## 2018-07-31 DIAGNOSIS — E7800 Pure hypercholesterolemia, unspecified: Secondary | ICD-10-CM

## 2018-07-31 NOTE — Progress Notes (Signed)
Established Patient Office Visit  Subjective:  Patient ID: Ray Case, male    DOB: 18-Sep-1965  Age: 53 y.o. MRN: [id number]  CC:  Chief Complaint  Patient presents with  . Follow-up    HPI Ray Case presents for follow-up of his elevated LDL.  He sometimes forgets to take the Crestor but believes that he has been taking it 3-5 times per week.  He continues to exercise despite gym closures.  He has a home yoga program, works out in his yard eats healthily and is been able to lose some weight.  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  He is currently living alone.  Past Medical History:  Diagnosis Date  . Achilles tendon rupture 2014   Left  . Complication of anesthesia    Pt had previouds experience of feeling pain after local anesthesia  . Headache(784.0)   . Hyperlipidemia   . Tuberculosis    Pt exposed and medicated  1990's    Past Surgical History:  Procedure Laterality Date  . ACHILLES TENDON SURGERY Left 02/24/2013   Procedure: LEFT ACHILLES TENDON REPAIR;  Surgeon: Cheral Almas, MD;  Location: [facility] OR;  Service: Orthopedics;  Laterality: Left;  . FRACTURE SURGERY Right     arm, metal pin    Family History  Problem Relation Age of Onset  . Cancer - Lung Mother        Mother died of lung cancer at age 54 (smoker)  . Hypertension Father        Father died in his 2s secondary to aortic aneurysm. He has history of stroke and was a smoker  . Diverticulosis Sister   . Schizophrenia Cousin        and nephew  . Obesity Sister        Sister died at age 65 secondary to complications of morbid obesity    Social History   Socioeconomic History  . Marital status: Single    Spouse name: Not on file  . Number of children: 0  . Years of education: Not on file  . Highest education level: Not on file  Occupational History  . Occupation: professor    Employer: [employer]  Social Needs  . Financial resource strain: Not on file  . Food insecurity:     Worry: Not on file    Inability: Not on file  . Transportation needs:    Medical: Not on file    Non-medical: Not on file  Tobacco Use  . Smoking status: Former Smoker    Packs/day: 0.25    Years: 8.00    Pack years: 2.00    Types: Cigarettes    Last attempt to quit: 12/27/2013    Years since quitting: 4.5  . Smokeless tobacco: Never Used  . Tobacco comment: pt goes in and out trying to quit  Substance and Sexual Activity  . Alcohol use: No    Alcohol/week: 4.0 standard drinks    Types: 2 Glasses of wine, 2 Shots of liquor per week    Comment: Prior to his Achilles tendon surgery patient was drinking 2-3 alcoholic beverages per day. He has discontinued alcohol use  . Drug use: No    Types: Marijuana    Comment: Patient reports discontinuing marijuana use after his Achilles tendon surgery  . Sexual activity: Not on file  Lifestyle  . Physical activity:    Days per week: Not on file    Minutes per session: Not on  file  . Stress: Not on file  Relationships  . Social connections:    Talks on phone: Not on file    Gets together: Not on file    Attends religious service: Not on file    Active member of club or organization: Not on file    Attends meetings of clubs or organizations: Not on file    Relationship status: Not on file  . Intimate partner violence:    Fear of current or ex partner: Not on file    Emotionally abused: Not on file    Physically abused: Not on file    Forced sexual activity: Not on file  Other Topics Concern  . 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   One caffeinated beverage daily    Outpatient Medications Prior to Visit  Medication Sig Dispense Refill  . rosuvastatin (CRESTOR) 20 MG tablet TAKE 1 TABLET BY MOUTH EVERY DAY 90 tablet 1  . venlafaxine XR (EFFEXOR XR) 75 MG 24 hr capsule Take 1 capsule (75 mg total) by mouth daily with breakfast. 90 capsule 3  . venlafaxine XR (EFFEXOR-XR) 37.5 MG 24 hr capsule TAKE  ONE CAPSULE BY MOUTH DAILY WITH BREAKFAST 90 capsule 0   No facility-administered medications prior to visit.     No Known Allergies  ROS Review of Systems  Constitutional: Negative.   Respiratory: Negative.   Cardiovascular: Negative.   Gastrointestinal: Negative.   Musculoskeletal: Positive for back pain.  Hematological: Negative.       Objective:    Physical Exam  Constitutional: He is oriented to person, place, and time. He appears well-developed and well-nourished. No distress.  HENT:  Head: Normocephalic and atraumatic.  Right Ear: External ear normal.  Left Ear: External ear normal.  Eyes: Right eye exhibits no discharge. Left eye exhibits no discharge. No scleral icterus.  Pulmonary/Chest: Effort normal.  Neurological: He is alert and oriented to person, place, and time.  Skin: He is not diaphoretic.  Psychiatric: He has a normal mood and affect. His behavior is normal.    Ht 6\' 1"  (1.854 m)   BMI 27.18 kg/m  Wt Readings from Last 3 Encounters:  01/26/18 206 lb (93.4 kg)  12/26/17 199 lb 2 oz (90.3 kg)  12/03/17 202 lb (91.6 kg)     Health Maintenance Due  Topic Date Due  . TETANUS/TDAP  08/19/1983    There are no preventive care reminders to display for this patient.  Lab Results  Component Value Date   TSH 0.74 09/02/2017   Lab Results  Component Value Date   WBC 4.2 09/02/2017   HGB 14.2 09/02/2017   HCT 42.7 09/02/2017   MCV 90.1 09/02/2017   PLT 172.0 09/02/2017   Lab Results  Component Value Date   NA 139 12/26/2017   K 4.9 12/26/2017   CO2 31 12/26/2017   GLUCOSE 93 12/26/2017   BUN 19 12/26/2017   CREATININE 1.00 12/26/2017   BILITOT 0.6 12/26/2017   ALKPHOS 54 12/26/2017   AST 17 12/26/2017   ALT 21 12/26/2017   PROT 7.3 12/26/2017   ALBUMIN 4.4 12/26/2017   CALCIUM 9.6 12/26/2017   GFR 100.39 12/26/2017   Lab Results  Component Value Date   CHOL 294 (H) 12/26/2017   Lab Results  Component Value Date   HDL 68.80  12/26/2017   Lab Results  Component Value Date   LDLCALC 201 (H) 12/26/2017   Lab Results  Component Value  Date   TRIG 118.0 12/26/2017   Lab Results  Component Value Date   CHOLHDL 4 12/26/2017   Lab Results  Component Value Date   HGBA1C 5.9 08/04/2009      Assessment & Plan:   Problem List Items Addressed This Visit    None      No orders of the defined types were placed in this encounter.   Follow-up: No follow-ups on file.    Mliss SaxWilliam Alfred Vonn Case, MDVirtual Visit via Video Note  I connected with Ray Case on 07/31/18 at  9:00 AM EDT by a video enabled telemedicine application and verified that I am speaking with the correct person using two identifiers.  Location: Patient:home Provider:   I discussed the limitations of evaluation and management by telemedicine and the availability of in person appointments. The patient expressed understanding and agreed to proceed.  History of Present Illness:    Observations/Objective:   Assessment and Plan:   Follow Up Instructions:    I discussed the assessment and treatment plan with the patient. The patient was provided an opportunity to ask questions and all were answered. The patient agreed with the plan and demonstrated an understanding of the instructions.   The patient was advised to call back or seek an in-person evaluation if the symptoms worsen or if the condition fails to improve as anticipated.  I provided 15 minutes of non-face-to-face time during this encounter.  Patient will make a lab appointment and come in to have his cholesterol rechecked.  We will see where he is with his LDL status post some weight loss lifestyle changes and Crestor 3-5 times per week.

## 2018-11-01 NOTE — Progress Notes (Signed)
Virtual Visit via Video Note  I connected with Ray Case on 11/01/18 at  4:15 PM EDT by a video enabled telemedicine application and verified that I am speaking with the correct person using two identifiers.  Location: Patient: at home   Provider: in office    I discussed the limitations of evaluation and management by telemedicine and the availability of in person appointments. The patient expressed understanding and agreed to proceed.  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  Was doing better last time we talked to him.  Now unfortunately worsening symptoms again.  Affecting daily activities, affecting sleep.  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  Patient is usually fairly active but cannot walk more than 200 feet without any significant discomfort and pain.  Patient states recently to have started to wake him up at night.  Some radiation down the leg as well as into the left testicle.  Denies any fevers or chills or any abnormal weight loss.    Observations/Objective: Alert and oriented x3, patient does seem to be more anxious than usual.   Assessment and Plan: Low back pain with radicular symptoms.  Discussed with patient in great length.  Discussed home exercises, icing regimen, I would like patient because of the worsening symptoms and failing all conservative therapy MRI is necessary.  Patient will have the MRI ordered and could be a candidate for an epidural.  Patient given 37.5 mg of Effexor that hopefully will be more beneficial.   Follow Up Instructions: Follow-up after MRI to discuss injections    I discussed the assessment and treatment plan with the patient. The patient was provided an opportunity to ask questions and all were answered. The patient agreed with the plan and demonstrated an understanding of the instructions.    The patient was advised to call back or seek an in-person evaluation if the symptoms worsen or if the condition fails to improve as anticipated.  I provided 25 minutes of face-to-face time during this encounter.   Lyndal Pulley, DO

## 2018-11-02 ENCOUNTER — Other Ambulatory Visit: Payer: Self-pay

## 2018-11-02 ENCOUNTER — Encounter: Payer: Self-pay | Admitting: Family Medicine

## 2018-11-02 ENCOUNTER — Ambulatory Visit (INDEPENDENT_AMBULATORY_CARE_PROVIDER_SITE_OTHER): Payer: BC Managed Care – PPO | Admitting: Family Medicine

## 2018-11-02 DIAGNOSIS — G8929 Other chronic pain: Secondary | ICD-10-CM | POA: Diagnosis not present

## 2018-11-02 DIAGNOSIS — M5416 Radiculopathy, lumbar region: Secondary | ICD-10-CM

## 2018-11-02 DIAGNOSIS — M545 Low back pain: Secondary | ICD-10-CM

## 2018-11-02 MED ORDER — VENLAFAXINE HCL ER 37.5 MG PO CP24: 37.5000 mg | ORAL_CAPSULE | Freq: Every day | ORAL | 1 refills | Status: DC

## 2018-11-26 ENCOUNTER — Other Ambulatory Visit: Payer: Self-pay | Admitting: Family Medicine

## 2018-11-28 ENCOUNTER — Ambulatory Visit
Admission: RE | Admit: 2018-11-28 | Discharge: 2018-11-28 | Disposition: A | Discharge status: Home / Self Care | Payer: BC Managed Care – PPO | Source: Ambulatory Visit | Attending: Family Medicine | Admitting: Family Medicine

## 2018-11-28 ENCOUNTER — Other Ambulatory Visit: Payer: Self-pay

## 2018-11-28 DIAGNOSIS — M5416 Radiculopathy, lumbar region: Secondary | ICD-10-CM

## 2018-12-14 ENCOUNTER — Encounter: Payer: Self-pay | Admitting: Family Medicine

## 2018-12-15 ENCOUNTER — Telehealth: Payer: Self-pay

## 2018-12-15 NOTE — Telephone Encounter (Signed)
Spoke with patient about epidural. Patient prefers to have non-invasive intervention. Would like to speak with Dr. Tamala Julian about physical therapy and other steps to help alleviate his pain. Is concerned with his job causing more pain.

## 2018-12-23 ENCOUNTER — Encounter: Payer: Self-pay | Admitting: Family Medicine

## 2018-12-23 ENCOUNTER — Ambulatory Visit (INDEPENDENT_AMBULATORY_CARE_PROVIDER_SITE_OTHER): Payer: BC Managed Care – PPO | Admitting: Family Medicine

## 2018-12-23 DIAGNOSIS — M545 Low back pain, unspecified: Secondary | ICD-10-CM

## 2018-12-23 DIAGNOSIS — G8929 Other chronic pain: Secondary | ICD-10-CM

## 2018-12-23 NOTE — Progress Notes (Signed)
Virtual Visit via Video Note  I connected with Chirstopher Aguallo on 12/23/18 at  4:15 PM EDT by a video enabled telemedicine application and verified that I am speaking with the correct person using two identifiers.  Location: Patient: In home setting Provider: In office setting   I discussed the limitations of evaluation and management by telemedicine and the availability of in person appointments. The patient expressed understanding and agreed to proceed.  History of Present Illness: Patient is a 53 year old male who was having chronic back pain with radicular symptoms.  Taking Effexor 37.5 and has been doing home exercises as well as responding somewhat to osteopathic manipulation for some time but worsening symptoms.  Centimeters in MRI.  MRI showed multiple level degenerative disc disease and facet arthropathy contributing to muscle discomfort and pain.  MRI was independently visualized by me.  Patient continues to have pain on a daily basis.    Observations/Objective: Alert and oriented x3, patient is moderately anxious.   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  Patient is elected to do formal physical therapy after discussing this, home exercises, epidurals and facet injections as well as the possibility of surgical intervention.  Patient will try the physical therapy for the next 6 weeks and then we will discuss further.  If fails physical therapy I would encourage him significantly to do the epidurals.  No change in medication   Follow Up Instructions: 6 weeks    I discussed the assessment and treatment plan with the patient. The patient was provided an opportunity to ask questions and all were answered. The patient agreed with the plan and demonstrated an understanding of the instructions.   The patient was advised to call back or seek an in-person evaluation if the symptoms worsen or if the condition fails to improve  as anticipated.  I provided 27 minutes of non-face-to-face time during this encounter.   Lyndal Pulley, DO

## 2018-12-24 ENCOUNTER — Other Ambulatory Visit: Payer: Self-pay | Admitting: Family Medicine

## 2018-12-24 DIAGNOSIS — E7800 Pure hypercholesterolemia, unspecified: Secondary | ICD-10-CM

## 2018-12-28 ENCOUNTER — Inpatient Hospital Stay (HOSPITAL_COMMUNITY)
Admission: EM | Admit: 2018-12-28 | Discharge: 2018-12-30 | DRG: 897 | Disposition: A | Discharge status: Home / Self Care | Payer: BC Managed Care – PPO | Attending: Student | Admitting: Student

## 2018-12-28 ENCOUNTER — Emergency Department (HOSPITAL_COMMUNITY): Payer: BC Managed Care – PPO

## 2018-12-28 ENCOUNTER — Encounter (HOSPITAL_COMMUNITY): Payer: Self-pay

## 2018-12-28 DIAGNOSIS — F329 Major depressive disorder, single episode, unspecified: Secondary | ICD-10-CM | POA: Diagnosis present

## 2018-12-28 DIAGNOSIS — E872 Acidosis, unspecified: Secondary | ICD-10-CM | POA: Diagnosis present

## 2018-12-28 DIAGNOSIS — F1021 Alcohol dependence, in remission: Secondary | ICD-10-CM | POA: Diagnosis present

## 2018-12-28 DIAGNOSIS — R112 Nausea with vomiting, unspecified: Secondary | ICD-10-CM | POA: Diagnosis present

## 2018-12-28 DIAGNOSIS — F10239 Alcohol dependence with withdrawal, unspecified: Secondary | ICD-10-CM | POA: Diagnosis not present

## 2018-12-28 DIAGNOSIS — F419 Anxiety disorder, unspecified: Secondary | ICD-10-CM | POA: Diagnosis present

## 2018-12-28 DIAGNOSIS — D72829 Elevated white blood cell count, unspecified: Secondary | ICD-10-CM

## 2018-12-28 DIAGNOSIS — R0789 Other chest pain: Secondary | ICD-10-CM | POA: Diagnosis present

## 2018-12-28 DIAGNOSIS — F10939 Alcohol use, unspecified with withdrawal, unspecified: Secondary | ICD-10-CM | POA: Diagnosis present

## 2018-12-28 DIAGNOSIS — I493 Ventricular premature depolarization: Secondary | ICD-10-CM | POA: Diagnosis present

## 2018-12-28 DIAGNOSIS — R079 Chest pain, unspecified: Secondary | ICD-10-CM | POA: Diagnosis not present

## 2018-12-28 DIAGNOSIS — Z818 Family history of other mental and behavioral disorders: Secondary | ICD-10-CM

## 2018-12-28 DIAGNOSIS — R0602 Shortness of breath: Secondary | ICD-10-CM | POA: Diagnosis not present

## 2018-12-28 DIAGNOSIS — E876 Hypokalemia: Secondary | ICD-10-CM | POA: Diagnosis present

## 2018-12-28 DIAGNOSIS — E785 Hyperlipidemia, unspecified: Secondary | ICD-10-CM | POA: Diagnosis present

## 2018-12-28 DIAGNOSIS — R197 Diarrhea, unspecified: Secondary | ICD-10-CM | POA: Diagnosis present

## 2018-12-28 DIAGNOSIS — Z87891 Personal history of nicotine dependence: Secondary | ICD-10-CM

## 2018-12-28 DIAGNOSIS — F1010 Alcohol abuse, uncomplicated: Secondary | ICD-10-CM | POA: Diagnosis not present

## 2018-12-28 DIAGNOSIS — Z20828 Contact with and (suspected) exposure to other viral communicable diseases: Secondary | ICD-10-CM | POA: Diagnosis present

## 2018-12-28 DIAGNOSIS — R0902 Hypoxemia: Secondary | ICD-10-CM | POA: Diagnosis present

## 2018-12-28 DIAGNOSIS — Z79899 Other long term (current) drug therapy: Secondary | ICD-10-CM

## 2018-12-28 DIAGNOSIS — F1210 Cannabis abuse, uncomplicated: Secondary | ICD-10-CM | POA: Diagnosis present

## 2018-12-28 LAB — BLOOD GAS, VENOUS
Acid-base deficit: 0.8 mmol/L (ref 0.0–2.0)
Bicarbonate: 21.7 mmol/L (ref 20.0–28.0)
FIO2: 21
O2 Saturation: 52.2 %
Patient temperature: 37
pCO2, Ven: 31.2 mmHg — ABNORMAL LOW (ref 44.0–60.0)
pH, Ven: 7.458 — ABNORMAL HIGH (ref 7.250–7.430)

## 2018-12-28 LAB — LIPASE, BLOOD: Lipase: 22 U/L (ref 11–51)

## 2018-12-28 LAB — URINALYSIS, ROUTINE W REFLEX MICROSCOPIC
Bilirubin Urine: NEGATIVE
Glucose, UA: 50 mg/dL — AB
Hgb urine dipstick: NEGATIVE
Ketones, ur: 80 mg/dL — AB
Leukocytes,Ua: NEGATIVE
Nitrite: NEGATIVE
Protein, ur: NEGATIVE mg/dL
Specific Gravity, Urine: 1.015 (ref 1.005–1.030)
pH: 8 (ref 5.0–8.0)

## 2018-12-28 LAB — CBC
HCT: 46.2 % (ref 39.0–52.0)
Hemoglobin: 15.5 g/dL (ref 13.0–17.0)
MCH: 30 pg (ref 26.0–34.0)
MCHC: 33.5 g/dL (ref 30.0–36.0)
MCV: 89.5 fL (ref 80.0–100.0)
Platelets: 219 10*3/uL (ref 150–400)
RBC: 5.16 MIL/uL (ref 4.22–5.81)
RDW: 12.8 % (ref 11.5–15.5)
WBC: 10.4 10*3/uL (ref 4.0–10.5)
nRBC: 0 % (ref 0.0–0.2)

## 2018-12-28 LAB — RAPID URINE DRUG SCREEN, HOSP PERFORMED
Amphetamines: NOT DETECTED
Barbiturates: NOT DETECTED
Benzodiazepines: NOT DETECTED
Cocaine: NOT DETECTED
Opiates: NOT DETECTED
Tetrahydrocannabinol: POSITIVE — AB

## 2018-12-28 LAB — COMPREHENSIVE METABOLIC PANEL
ALT: 25 U/L (ref 0–44)
AST: 27 U/L (ref 15–41)
Albumin: 4.9 g/dL (ref 3.5–5.0)
Alkaline Phosphatase: 61 U/L (ref 38–126)
Anion gap: 21 — ABNORMAL HIGH (ref 5–15)
BUN: 12 mg/dL (ref 6–20)
CO2: 15 mmol/L — ABNORMAL LOW (ref 22–32)
Calcium: 9.7 mg/dL (ref 8.9–10.3)
Chloride: 105 mmol/L (ref 98–111)
Creatinine, Ser: 0.93 mg/dL (ref 0.61–1.24)
GFR calc Af Amer: >60 mL/min (ref >60)
GFR calc non Af Amer: >60 mL/min (ref >60)
Glucose, Bld: 151 mg/dL — ABNORMAL HIGH (ref 70–99)
Potassium: 3.3 mmol/L — ABNORMAL LOW (ref 3.5–5.1)
Sodium: 141 mmol/L (ref 135–145)
Total Bilirubin: 1 mg/dL (ref 0.3–1.2)
Total Protein: 8.2 g/dL — ABNORMAL HIGH (ref 6.5–8.1)

## 2018-12-28 LAB — CK: Total CK: 125 U/L (ref 49–397)

## 2018-12-28 LAB — MAGNESIUM: Magnesium: 1.6 mg/dL — ABNORMAL LOW (ref 1.7–2.4)

## 2018-12-28 LAB — TROPONIN I (HIGH SENSITIVITY): Troponin I (High Sensitivity): 4 ng/L (ref <18)

## 2018-12-28 LAB — LACTIC ACID, PLASMA
Lactic Acid, Venous: 2.5 mmol/L (ref 0.5–1.9)
Lactic Acid, Venous: 2.8 mmol/L (ref 0.5–1.9)

## 2018-12-28 LAB — ETHANOL: Alcohol, Ethyl (B): <10 mg/dL (ref <10)

## 2018-12-28 MED ORDER — SODIUM CHLORIDE 0.9 % IV BOLUS
1000.0000 mL | Freq: Once | INTRAVENOUS | Status: AC
  Administered 2018-12-28: 20:00:00 1000 mL via INTRAVENOUS

## 2018-12-28 MED ORDER — POTASSIUM CHLORIDE 10 MEQ/100ML IV SOLN
10.0000 meq | INTRAVENOUS | Status: AC
  Administered 2018-12-28 (×2): 10 meq via INTRAVENOUS
  Filled 2018-12-28 (×2): qty 100

## 2018-12-28 MED ORDER — SODIUM CHLORIDE 0.9% FLUSH
3.0000 mL | Freq: Once | INTRAVENOUS | Status: AC
  Administered 2018-12-28: 18:00:00 3 mL via INTRAVENOUS

## 2018-12-28 MED ORDER — LORAZEPAM 2 MG/ML IJ SOLN
1.0000 mg | Freq: Once | INTRAMUSCULAR | Status: AC
  Administered 2018-12-28: 18:00:00 1 mg via INTRAVENOUS
  Filled 2018-12-28: qty 1

## 2018-12-28 MED ORDER — HYDROMORPHONE HCL 1 MG/ML IJ SOLN
0.5000 mg | Freq: Once | INTRAMUSCULAR | Status: AC
  Administered 2018-12-28: 0.5 mg via INTRAVENOUS
  Filled 2018-12-28: qty 1

## 2018-12-28 MED ORDER — SODIUM CHLORIDE (PF) 0.9 % IJ SOLN
INTRAMUSCULAR | Status: AC
  Filled 2018-12-28: qty 50

## 2018-12-28 MED ORDER — ONDANSETRON HCL 4 MG/2ML IJ SOLN: 4.0000 mg | Freq: Once | INTRAMUSCULAR | Status: DC | PRN

## 2018-12-28 MED ORDER — IOHEXOL 350 MG/ML SOLN
100.0000 mL | Freq: Once | INTRAVENOUS | Status: AC | PRN
  Administered 2018-12-28: 100 mL via INTRAVENOUS

## 2018-12-28 MED ORDER — LORAZEPAM 2 MG/ML IJ SOLN
1.0000 mg | Freq: Once | INTRAMUSCULAR | Status: AC
  Administered 2018-12-28: 1 mg via INTRAVENOUS
  Filled 2018-12-28: qty 1

## 2018-12-28 MED ORDER — MAGNESIUM SULFATE 2 GM/50ML IV SOLN
2.0000 g | Freq: Once | INTRAVENOUS | Status: AC
  Administered 2018-12-28: 2 g via INTRAVENOUS
  Filled 2018-12-28: qty 50

## 2018-12-28 MED ORDER — SODIUM CHLORIDE 0.9 % IV BOLUS
1000.0000 mL | Freq: Once | INTRAVENOUS | Status: AC
  Administered 2018-12-28: 1000 mL via INTRAVENOUS

## 2018-12-28 NOTE — H&P (Signed)
History and Physical    Ray Case ZOX:[id number] DOB: Aug 25, 1965 DOA: 12/28/2018  Referring MD/NP/PA:   PCP: Mliss SaxKremer, William Alfred, MD   Patient coming from:  The patient is coming from home.  At baseline, pt is independent for most of ADL.        Chief Complaint: Shortness of breath, chest pain, nausea, vomiting, diarrhea  HPI: Ray Case is a 53 y.o. male with medical history significant of hyperlipidemia, tobacco abuse, former smoker, marijuana abuse, who presents with shortness of breath, chest pain, nausea, vomiting and diarrhea.  Patient states that his symptoms started yesterday afternoon.  He reports shortness of breath and chills, no fever.  Denies cough.  He had pressure-like chest pain in the front chest earlier, which has completely resolved.  His shortness breath has also resolved currently.  Patient reports nausea, vomiting and diarrhea for several times.  No abdominal pain.  No symptoms of UTI.  No unilateral weakness.  No tenderness in the calf areas.  Patient had one episode of oxygen desaturation to 75% on room air, currently oxygen saturation 99% on room air.  ED Course: pt was found to have troponin IV, lactic acid 2.5, 2.8, WBC 10.4, negative COVID-19 test, alcohol level less than 10, CK 125, potassium 3.3, renal function normal, temperature 97.5, blood pressure 170/81, then 135/70, bradycardia, chest x-ray negative. Pt is placed on telemetry bed of observation.  CTA of chest/abd/pelvis: 1. Motion degraded exam secondary to both patient and respiratory motion artifact. 2. No evidence of acute aortic syndrome. No acute intrathoracic process. 3. Mild circumferential bladder wall thickening versus underdistention. Correlate with urinary symptoms and consider urinalysis. 4. Multilevel degenerative changes in the spine. 5. 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 Extensive associated sclerotic endplate changes. 6. 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    Review of Systems:   General: no fevers, has chills, no body weight gain, has poor appetite, has fatigue HEENT: no blurry vision, hearing changes or sore throat Respiratory: has dyspnea, no coughing, wheezing CV: has chest pain, no palpitations GI: has nausea, vomiting,  diarrhea, no constipation and abdominal pain, GU: no dysuria, burning on urination, increased urinary frequency, hematuria  Ext: no leg edema Neuro: no unilateral weakness, numbness, or tingling, no vision change or hearing loss Skin: no rash, no skin tear. MSK: No muscle spasm, no deformity, no limitation of range of movement in spin Heme: No easy bruising.  Travel history: No recent long distant travel.  Allergy: No Known Allergies  Past Medical History:  Diagnosis Date  . Achilles tendon rupture 2014   Left  . Complication of anesthesia    Pt had previouds experience of feeling pain after local anesthesia  . Headache(784.0)   . Hyperlipidemia   . Tuberculosis    Pt exposed and medicated  1990's    Past Surgical History:  Procedure Laterality Date  . ACHILLES TENDON SURGERY Left 02/24/2013   Procedure: LEFT ACHILLES TENDON REPAIR;  Surgeon: Cheral AlmasNaiping Michael Xu, MD;  [facility];  Service: Orthopedics;  Laterality: Left;  . FRACTURE SURGERY Right     arm, metal pin    Social History:  reports that he quit smoking about 5 years ago. His smoking use included cigarettes. He has a 2.00 pack-year smoking history. He has never used smokeless tobacco. He reports current alcohol use of about 4.0 standard drinks of alcohol per week. He reports current drug use. Drug: Marijuana.  Family History:  Family History  Problem Relation Age of Onset  . Cancer - Lung Mother        Mother died of lung cancer at age 67 (smoker)  . Hypertension Father        Father died in his 87s secondary to aortic aneurysm. He has history of stroke and was a smoker  . AAA (abdominal aortic aneurysm) Father    . Diverticulosis Sister   . Schizophrenia Cousin        and nephew  . Obesity Sister        Sister died at age 17 secondary to complications of morbid obesity     Prior to Admission medications   Medication Sig Start Date End Date Taking? Authorizing Provider  rosuvastatin (CRESTOR) 20 MG tablet TAKE 1 TABLET BY MOUTH EVERY DAY Patient taking differently: Take 20 mg by mouth daily.  12/24/18  Yes Mliss Sax, MD  venlafaxine XR (EFFEXOR-XR) 37.5 MG 24 hr capsule TAKE 1 CAPSULE (37.5 MG TOTAL) BY MOUTH DAILY WITH BREAKFAST. 11/26/18  Yes Judi Saa, DO    Physical Exam: Vitals:   12/29/18 0400 12/29/18 0430 12/29/18 0500 12/29/18 0530  BP: 136/81 125/82 126/77 131/81  Pulse: 98 84 74 74  Resp: (!) Temp:      TempSrc:      SpO2: 100% 100% 100% 100%  Weight:      Height:       General: Not in acute distress HEENT:       Eyes: PERRL, EOMI, no scleral icterus.       ENT: No discharge from the ears and nose, no pharynx injection, no tonsillar enlargement.        Neck: No JVD, no bruit, no mass felt. Heme: No neck lymph node enlargement. Cardiac: S1/S2, RRR, No murmurs, No gallops or rubs. Respiratory: No rales, wheezing, rhonchi or rubs. GI: Soft, nondistended, nontender, no rebound pain, no organomegaly, BS present. GU: No hematuria Ext: No pitting leg edema bilaterally. 2+DP/PT pulse bilaterally. Musculoskeletal: No joint deformities, No joint redness or warmth, no limitation of ROM in spin. Skin: No rashes.  Neuro: Alert, oriented X3, cranial nerves II-XII grossly intact, moves all extremities normally. Psych: Patient is not psychotic, no suicidal or hemocidal ideation.  Labs on Admission: I have personally reviewed following labs and imaging studies  CBC: Recent Labs  Lab 12/28/18 1756  WBC 10.4  HGB 15.5  HCT 46.2  MCV 89.5  PLT 219   Basic Metabolic Panel: Recent Labs  Lab 12/28/18 1756 12/28/18 2116  NA 141  --   K 3.3*  --    CL 105  --   CO2 15*  --   GLUCOSE 151*  --   BUN 12  --   CREATININE 0.93  --   CALCIUM 9.7  --   MG  --  1.6*   GFR: Estimated Creatinine Clearance: 104.7 mL/min (by C-G formula based on SCr of 0.93 mg/dL). Liver Function Tests: Recent Labs  Lab 12/28/18 1756  AST 27  ALT 25  ALKPHOS 61  BILITOT 1.0  PROT 8.2*  ALBUMIN 4.9   Recent Labs  Lab 12/28/18 1756  LIPASE 22   No results for input(s): AMMONIA in the last 168 hours. Coagulation Profile: No results for input(s): INR, PROTIME in the last 168 hours. Cardiac Enzymes: Recent Labs  Lab 12/28/18 1825  CKTOTAL 125   BNP (last 3 results) No results for input(s): PROBNP in the last  8760 hours. HbA1C: No results for input(s): HGBA1C in the last 72 hours. CBG: No results for input(s): GLUCAP in the last 168 hours. Lipid Profile: No results for input(s): CHOL, HDL, LDLCALC, TRIG, CHOLHDL, LDLDIRECT in the last 72 hours. Thyroid Function Tests: No results for input(s): TSH, T4TOTAL, FREET4, T3FREE, THYROIDAB in the last 72 hours. Anemia Panel: No results for input(s): VITAMINB12, FOLATE, FERRITIN, TIBC, IRON, RETICCTPCT in the last 72 hours. Urine analysis:    Component Value Date/Time   COLORURINE YELLOW 12/28/2018 1756   APPEARANCEUR CLEAR 12/28/2018 1756   LABSPEC 1.015 12/28/2018 1756   PHURINE 8.0 12/28/2018 1756   GLUCOSEU 50 (A) 12/28/2018 1756   GLUCOSEU NEGATIVE 09/02/2017 0833   HGBUR NEGATIVE 12/28/2018 1756   BILIRUBINUR NEGATIVE 12/28/2018 1756   BILIRUBINUR neg 07/04/2014 1220   KETONESUR 80 (A) 12/28/2018 1756   PROTEINUR NEGATIVE 12/28/2018 1756   UROBILINOGEN 0.2 09/02/2017 0833   NITRITE NEGATIVE 12/28/2018 1756   LEUKOCYTESUR NEGATIVE 12/28/2018 1756   Sepsis Labs: @LABRCNTIP (procalcitonin:4,lacticidven:4) ) Recent Results (from the past 240 hour(s))  SARS Coronavirus 2 [facility] order, Performed in [facility] hospital lab) Nasopharyngeal Nasopharyngeal Swab     Status: None    Collection Time: 12/28/18 10:15 PM   Specimen: Nasopharyngeal Swab  Result Value Ref Range Status   SARS Coronavirus 2 NEGATIVE NEGATIVE Final    Comment: (NOTE) If result is NEGATIVE SARS-CoV-2 target nucleic acids are NOT DETECTED. The SARS-CoV-2 RNA is generally detectable in upper and lower  respiratory specimens during the acute phase of infection. The lowest  concentration of SARS-CoV-2 viral copies this assay can detect is 250  copies / mL. A negative result does not preclude SARS-CoV-2 infection  and should not be used as the sole basis for treatment or other  patient management decisions.  A negative result may occur with  improper specimen collection / handling, submission of specimen other  than nasopharyngeal swab, presence of viral mutation(s) within the  areas targeted by this assay, and inadequate number of viral copies  (<250 copies / mL). A negative result must be combined with clinical  observations, patient history, and epidemiological information. If result is POSITIVE SARS-CoV-2 target nucleic acids are DETECTED. The SARS-CoV-2 RNA is generally detectable in upper and lower  respiratory specimens dur ing the acute phase of infection.  Positive  results are indicative of active infection with SARS-CoV-2.  Clinical  correlation with patient history and other diagnostic information is  necessary to determine patient infection status.  Positive results do  not rule out bacterial infection or co-infection with other viruses. If result is PRESUMPTIVE POSTIVE SARS-CoV-2 nucleic acids MAY BE PRESENT.   A presumptive positive result was obtained on the submitted specimen  and confirmed on repeat testing.  While 2019 novel coronavirus  (SARS-CoV-2) nucleic acids may be present in the submitted sample  additional confirmatory testing may be necessary for epidemiological  and / or clinical management purposes  to differentiate between  SARS-CoV-2 and other Sarbecovirus  currently known to infect humans.  If clinically indicated additional testing with an alternate test  methodology [id number]) is advised. The SARS-CoV-2 RNA is generally  detectable in upper and lower respiratory sp ecimens during the acute  phase of infection. The expected result is Negative. Fact Sheet for Patients:  (TFT7322 Fact Sheet for Healthcare Providers: BoilerBrush.com.cy This test is not yet approved or cleared by the https://pope.com/ FDA and has been authorized for detection and/or diagnosis of SARS-CoV-2 by FDA under an Emergency  Use Authorization (EUA).  This EUA will remain in effect (meaning this test can be used) for the duration of the COVID-19 declaration under Section 564(b)(1) of the Act, 21 U.S.C. section 360bbb-3(b)(1), unless the authorization is terminated or revoked sooner. Performed at [facility], [place] [address] [place], [place] [address]      Radiological Exams on Admission: Dg Chest Portable 1 View  Result Date: 12/28/2018 CLINICAL DATA:  53 y.o male BIBA from home. Pt was feeling his normal this morning, then started having SHOB with chills, diaphoresis this afternoon. Sats originally 99%, then 76%. Pt went to tripod positioning with EMS. Pt was put on nonrebreather with EMS. Hx TB. EXAM: PORTABLE CHEST 1 VIEW COMPARISON:  None. FINDINGS: The heart size and mediastinal contours are within normal limits. The lungs are clear. No pneumothorax or pleural effusion. The visualized skeletal structures are unremarkable. IMPRESSION: No evidence of active disease. Electronically Signed   By: Audie Pinto M.D.   On: 12/28/2018 18:48   Ct Angio Chest/abd/pel For Dissection W And/or Wo Contrast  Result Date: 12/28/2018 CLINICAL DATA:  Chest pain, acute aortic syndrome suspected, hemodynamically stable. EXAM: CT ANGIOGRAPHY CHEST, ABDOMEN AND PELVIS TECHNIQUE: Multidetector CT imaging through  the chest, abdomen and pelvis was performed using the standard protocol during bolus administration of intravenous contrast. Multiplanar reconstructed images and MIPs were obtained and reviewed to evaluate the vascular anatomy. CONTRAST:  173mL OMNIPAQUE IOHEXOL 350 MG/ML SOLN COMPARISON:  Chest radiograph 12/28/2018 FINDINGS: CTA CHEST FINDINGS Cardiovascular: Noncontrast CT of the chest reveals no [name] hyperdense mural thickening to suggest intramural hematoma. Postcontrast evaluation of the chest demonstrates slight preferential opacification of pulmonary arteries over contrast bolus of the aorta remains of diagnostic utility. The aortic root is suboptimally assessed given cardiac pulsation artifact. The aorta is normal caliber. No intramural hematoma, dissection flap or other acute luminal abnormality of the aorta is seen. No periaortic stranding or hemorrhage. Normal 3 vessel arch. Proximal great vessels and subclavian arteries free of acute abnormality. Mild tortuosity of the right brachiocephalic vessels is present. Central pulmonary arteries are well opacified. No central or lobar filling defects are seen. More distal evaluation is limited by respiratory motion artifact. Normal heart size. No pericardial effusion. Mediastinum/Nodes: No enlarged mediastinal or axillary lymph nodes. Thyroid gland, trachea, and esophagus demonstrate no significant findings. Lungs/Pleura: No consolidation, features of edema, pneumothorax, or effusion. No suspicious pulmonary nodules or masses. Evaluation is limited by respiratory motion artifact of the lung parenchyma. Musculoskeletal: No acute or suspicious osseous lesions present within the spine. Multilevel degenerative changes are present in the imaged portions of the spine. Few Schmorl's nodes are seen. Review of the MIP images confirms the above findings. CTA ABDOMEN AND PELVIS FINDINGS VASCULAR Aorta: Normal caliber aorta without aneurysm, dissection, vasculitis or  significant stenosis. Celiac: Patent without evidence of aneurysm, dissection, vasculitis or significant stenosis. SMA: Patent without evidence of aneurysm, dissection, vasculitis or significant stenosis. Renals: 2 right renal arteries, single left renal artery. The arteries are patent without evidence of aneurysm, dissection, vasculitis, fibromuscular dysplasia or significant stenosis. IMA: Patent without evidence of aneurysm, dissection, vasculitis or significant stenosis. Inflow: Patent without evidence of aneurysm, dissection, vasculitis or significant stenosis. Veins: No obvious venous abnormality within the limitations of this arterial phase study. Review of the MIP images confirms the above findings. NON-VASCULAR Hepatobiliary: No focal liver abnormality is seen. No gallstones, gallbladder wall thickening, or biliary dilatation. Pancreas: Unremarkable. No pancreatic ductal dilatation or surrounding inflammatory changes. Spleen: Normal  in size without focal abnormality. Adrenals/Urinary Tract: Adrenal glands are unremarkable. Kidneys are normal, without renal calculi, focal lesion, or hydronephrosis. Mild bladder wall thickening versus underdistention. Stomach/Bowel: Distal esophagus, stomach and duodenal sweep are unremarkable. No bowel wall thickening or dilatation. No evidence of obstruction. A normal appendix is visualized. No colonic dilatation or wall thickening. Lymphatic: No suspicious or enlarged lymph nodes in the included lymphatic chains. Reproductive: The prostate and seminal vesicles are unremarkable. Other: No abdominopelvic free fluid or free gas. No bowel containing hernias. Musculoskeletal: Multilevel degenerative changes are present in the imaged portions of the spine. There are bilateral L4 pars defects with grade 2 anterolisthesis of L4 on L5 and extensive sclerotic endplate changes. Question remote fracture versus degenerative change of the L3 spinous process. Avascular necrosis is noted  in both femoral heads. Degenerative changes are present in both hips. No acute osseous abnormality or suspicious osseous lesion. Review of the MIP images confirms the above findings. IMPRESSION: 1. Motion degraded exam secondary to both patient and respiratory motion artifact. 2. No evidence of acute aortic syndrome. No acute intrathoracic process. 3. Mild circumferential bladder wall thickening versus underdistention. Correlate with urinary symptoms and consider urinalysis. 4. Multilevel degenerative changes in the spine. 5. 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 Extensive associated sclerotic endplate changes. 6. 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 Electronically Signed   By: Kreg Shropshire M.D.   On: 12/28/2018 23:01     EKG: Independently reviewed.  Sinus rhythm, QTC 513, PVC, T wave inversion in V3-V6 and in inferior leads.   Assessment/Plan Principal Problem:   SOB (shortness of breath) Active Problems:   Nausea, vomiting and diarrhea   Chest pain   Hypokalemia   Hypomagnesemia   Lactic acidosis   Alcohol abuse   SOB (shortness of breath) and chest pain: Etiology is not clear.  CT angiogram per dissection protocol is negative though there is motion degradation.  His symptoms of shortness of breath and chest pain have resolved currently.  Patient responded to Ativan 1 mg x 2 in ED. He may have had panic attack.  Since patient has mild TWI in V3-V6 and inferior leads.  Will trend troponin.  COVID-19 negative.  - will place on Tele bed for obs - Trend Trop - Repeat EKG in the am  - prn Nitroglycerin, Morphine, and aspirin, lipitor  - Risk factor stratification: will check FLP and A1C  - check UDS  Nausea, vomiting and diarrhea: No AP.  No fever or leukocytosis.  Possibly due to viral gastroenteritis. -Supportive care -IV fluid: 2 L normal saline, 125 cc/h -PRN Zofran  Hypokalemia and Hypomagnesemia: -Repleted both  Lactic acidosis:  lactic acid 2.5-->2.8.  No signs of infection.  Possibly due to starvation and alcohol abuse -IV fluid as above - Trend lactic acid level  Alcohol abuse: -CiWA protocol -Did counseling about importance of quitting alcohol drinking   DVT ppx:  SQ Lovenox Code Status: Full code Family Communication: Yes, patient's partner at bed side Disposition Plan:  Anticipate discharge back to previous home environment Consults called:  none Admission status: Obs / tele    Date of Service 12/29/2018    Lorretta Harp [facility]   If 7PM-7AM, please contact night-coverage [email or web address] Password [facility] 12/29/2018, 6:21 AM

## 2018-12-28 NOTE — ED Notes (Signed)
Patient's oxygen saturation dropped to mid-70s, patient drowsy but able to awakened with stimuli. Placed on 2 L [place] for comfort. Patient has two episodes of vomiting, states constant nausea and is very sweaty.

## 2018-12-28 NOTE — ED Triage Notes (Signed)
Pt BIBA from home. Pt was feeling his normal this morning, then started having SHOB with chills, diaphoresis this afternoon. Sats originally 99%, then 76%. Pt went to tripod positioning with EMS. Pt was put on nonrebreather with EMS.

## 2018-12-28 NOTE — ED Notes (Signed)
Patient stated that if staff would leave him alone he could calm down and relax. Informed patient that he is here to receive help and staff will help him. Call bell in reach, no other acute needs identified.

## 2018-12-28 NOTE — ED Provider Notes (Signed)
[facility]-EMERGENCY DEPT Provider Note   CSN: [id number] Arrival date & time: 12/28/18  1717     History   Chief Complaint Chief Complaint  Patient presents with   Shortness of Breath   Emesis    HPI Ray Case is a 53 y.o. male.  Presents the emergency department with nausea vomiting, diaphoresis.  States symptoms started suddenly this this afternoon.  Initially started with feeling short of breath and having chills, but the sweating started.  EMS was called, they reported patient was tripoding and hypoxic and was placed on a nonrebreather.  At time of my initial interview patient states that he feels very unwell but denies any focal pain.  States he feels nauseous but is not having more vomiting.  Denies any chest pain or abdominal pain.  On reassessment patient does endorse severe chest pain as well as abdominal pain, worse in epigastrium, sharp stabbing, radiating to back.     HPI  Past Medical History:  Diagnosis Date   Achilles tendon rupture 2014   Left   Complication of anesthesia    Pt had previouds experience of feeling pain after local anesthesia   Headache(784.0)    Hyperlipidemia    Tuberculosis    Pt exposed and medicated  1990's    Patient Active Problem List   Diagnosis Date Noted   Hamstring tendinitis of left thigh 07/01/2018   Nonallopathic lesion of lumbosacral region 12/03/2017   Nonallopathic lesion of rib cage 12/03/2017   Elevated LDL cholesterol level 06/13/2017   Cervical radiculopathy 06/13/2017   Right elbow pain 06/13/2017   Tibialis posterior tendinitis, right 11/27/2016   Hip abductor tendinitis 11/27/2016   Encounter for health maintenance examination with abnormal findings 07/13/2014   Chronic neck pain 07/12/2014   Nonallopathic lesion of cervical region 07/12/2014   Nonallopathic lesion of thoracic region 07/12/2014   Nonallopathic lesion of sacral region 07/12/2014   Chronic back pain  06/15/2014   Insomnia 05/24/2013   Achilles tendon rupture 02/24/2013   LOW BACK PAIN, CHRONIC 11/03/2008   HERPES, GENITAL NOS 01/16/2007   ANXIETY 01/16/2007   DEPRESSION 01/16/2007   ALLERGIC RHINITIS 01/16/2007    Past Surgical History:  Procedure Laterality Date   ACHILLES TENDON SURGERY Left 02/24/2013   Procedure: LEFT ACHILLES TENDON REPAIR;  Surgeon: Cheral AlmasNaiping Michael Xu, MD;  Location: [facility] OR;  Service: Orthopedics;  Laterality: Left;   FRACTURE SURGERY Right     arm, metal pin        Home Medications    Prior to Admission medications   Medication Sig Start Date End Date Taking? Authorizing Provider  rosuvastatin (CRESTOR) 20 MG tablet TAKE 1 TABLET BY MOUTH EVERY DAY 12/24/18   Mliss SaxKremer, William Alfred, MD  venlafaxine XR (EFFEXOR-XR) 37.5 MG 24 hr capsule TAKE 1 CAPSULE (37.5 MG TOTAL) BY MOUTH DAILY WITH BREAKFAST. 11/26/18   Judi SaaSmith, Zachary M, DO    Family History Family History  Problem Relation Age of Onset   Cancer - Lung Mother        Mother died of lung cancer at age 54 (smoker)   Hypertension Father        Father died in his 6060s secondary to aortic aneurysm. He has history of stroke and was a smoker   AAA (abdominal aortic aneurysm) Father    Diverticulosis Sister    Schizophrenia Cousin        and nephew   Obesity Sister        Sister died  at age 8 secondary to complications of morbid obesity    Social History Social History   Tobacco Use   Smoking status: Former Smoker    Packs/day: 0.25    Years: 8.00    Pack years: 2.00    Types: Cigarettes    Quit date: 12/27/2013    Years since quitting: 5.0   Smokeless tobacco: Never Used   Tobacco comment: pt goes in and out trying to quit  Substance Use Topics   Alcohol use: Yes    Alcohol/week: 4.0 standard drinks    Types: 2 Glasses of wine, 2 Shots of liquor per week    Comment: Prior to his Achilles tendon surgery patient was drinking 2-3 alcoholic beverages per day. He has  discontinued alcohol use   Drug use: Yes    Types: Marijuana     Allergies   Patient has no known allergies.   Review of Systems Review of Systems  Constitutional: Positive for diaphoresis. Negative for chills and fever.  HENT: Negative for ear pain and sore throat.   Eyes: Negative for pain and visual disturbance.  Respiratory: Negative for cough and shortness of breath.   Cardiovascular: Positive for chest pain. Negative for palpitations.  Gastrointestinal: Positive for abdominal pain and vomiting.  Genitourinary: Negative for dysuria and hematuria.  Musculoskeletal: Negative for arthralgias and back pain.  Skin: Negative for color change and rash.  Neurological: Negative for seizures and syncope.  All other systems reviewed and are negative.    Physical Exam Updated Vital Signs BP (!) 155/89    Pulse (!) 59    Temp (!) 97.5 F (36.4 C) (Oral)    Resp 17    Ht 5\' 11"  (1.803 m)    Wt 90.7 kg    SpO2 100%    BMI 27.89 kg/m   Physical Exam Vitals signs and nursing note reviewed.  Constitutional:      Comments: Appears anxious, diaphoretic, no acute distress  HENT:     Head: Normocephalic and atraumatic.  Eyes:     Conjunctiva/sclera: Conjunctivae normal.  Neck:     Musculoskeletal: Neck supple.  Cardiovascular:     Rate and Rhythm: Normal rate and regular rhythm.     Heart sounds: No murmur.  Pulmonary:     Effort: Pulmonary effort is normal. No respiratory distress.     Breath sounds: Normal breath sounds.  Chest:     Chest wall: No mass or deformity.  Abdominal:     Palpations: Abdomen is soft.     Tenderness: There is no abdominal tenderness.  Musculoskeletal:     Right lower leg: No edema.     Left lower leg: No edema.  Skin:    General: Skin is warm.     Capillary Refill: Capillary refill takes less than 2 seconds.     Comments: Diaphoretic  Neurological:     General: No focal deficit present.     Mental Status: He is oriented to person, place, and  time.      ED Treatments / Results  Labs (all labs ordered are listed, but only abnormal results are displayed) Labs Reviewed  LIPASE, BLOOD  COMPREHENSIVE METABOLIC PANEL  CBC  URINALYSIS, ROUTINE W REFLEX MICROSCOPIC    EKG None  Radiology No results found.  Procedures .Critical Care Performed by: Lucrezia Starch, MD Authorized by: Lucrezia Starch, MD   Critical care provider statement:    Critical care time (minutes):  45   Critical care was  necessary to treat or prevent imminent or life-threatening deterioration of the following conditions:  Metabolic crisis   Critical care was time spent personally by me on the following activities:  Discussions with consultants, evaluation of patient's response to treatment, examination of patient, ordering and performing treatments and interventions, ordering and review of laboratory studies, ordering and review of radiographic studies, pulse oximetry, re-evaluation of patient's condition, obtaining history from patient or surrogate and review of old charts   (including critical care time)  Medications Ordered in ED Medications  sodium chloride flush (NS) 0.9 % injection 3 mL (has no administration in time range)  ondansetron (ZOFRAN) injection 4 mg (has no administration in time range)     Initial Impression / Assessment and Plan / ED Course  I have reviewed the triage vital signs and the nursing notes.  Pertinent labs & imaging results that were available during my care of the patient were reviewed by me and considered in my medical decision making (see chart for details).  Clinical Course as of Dec 28 8  Mon Dec 28, 2018  1062 Complete initial assessment, diaphoretic, tremulous, but no other complaints, no other acute PE findings   [RD]  2004 Recheck patient, symptoms resolved, resting in bed in no acute distress, no further diaphoresis   [RD]  2218 Reassessed patient, now complaining of severe chest pain,  epigastric pain, repeat EKG with worsening QT prolongation, no new ischemic changes, will check CT chest/abd/pelv imaging for further investigation   [RD]    Clinical Course User Index [RD] Milagros Loll, MD       53 year old presented with nausea vomiting diaphoresis.  On my initial assessment patient was very diaphoretic, tremulous but did not find any other acute physical exam abnormality.  Later patient complaining of severe chest and abdominal pain.  Performed extensive medical work-up which included blood work.  Concerning for dehydration, leukocytosis, hypomagnesemia, hypokalemia.  His EKG I noted some T wave inversions but no ST segment changes, no prior for comparison, repeat without any worsening change, troponin was not particularly elevated, low suspicion for acute coronary syndrome.  EKG was concerning for prolonged QT.  Rechecked this and it had gotten worse.  Gave patient magnesium and potassium replacement.  Unfortunately the QT prolongation is limited therapeutic options for his ongoing vomiting.  Gave patient 2 L fluids, recheck lactic which was still elevated.  Given the reported chest and abdominal pain, obtain CTA chest to rule out acute aortic pathology, as well as any acute chest or abdominal pathology.  CT chest abdomen pelvis was negative.  UDS concerning for marijuana use, could be hyperemesis cannabis.  Sent for COVID swab.  Given extensive symptoms and worsening QT prolongation, will admit to hospitalist for further management electrolyte derangement, intractable nausea vomiting and QT monitoring.  Final Clinical Impressions(s) / ED Diagnoses   Final diagnoses:  Intractable nausea and vomiting  Leukocytosis, unspecified type  Hypokalemia  Hypomagnesemia  Marijuana abuse    ED Discharge Orders    None       Milagros Loll, MD 12/29/18 [phone]

## 2018-12-28 NOTE — ED Notes (Signed)
Patient is thrashing around in the bed stating he cannot get comfortable, restless appearance and very agitated. Pt pulling at cords and wires. MD made aware. Patient moaning and groaning loudly in room, states that he feels nauseated. Also wants to eat and drink.

## 2018-12-29 ENCOUNTER — Other Ambulatory Visit: Payer: Self-pay

## 2018-12-29 DIAGNOSIS — Z20828 Contact with and (suspected) exposure to other viral communicable diseases: Secondary | ICD-10-CM | POA: Diagnosis present

## 2018-12-29 DIAGNOSIS — F419 Anxiety disorder, unspecified: Secondary | ICD-10-CM | POA: Diagnosis present

## 2018-12-29 DIAGNOSIS — Z818 Family history of other mental and behavioral disorders: Secondary | ICD-10-CM | POA: Diagnosis not present

## 2018-12-29 DIAGNOSIS — E785 Hyperlipidemia, unspecified: Secondary | ICD-10-CM | POA: Diagnosis present

## 2018-12-29 DIAGNOSIS — F1010 Alcohol abuse, uncomplicated: Secondary | ICD-10-CM | POA: Diagnosis not present

## 2018-12-29 DIAGNOSIS — R0902 Hypoxemia: Secondary | ICD-10-CM | POA: Diagnosis present

## 2018-12-29 DIAGNOSIS — R072 Precordial pain: Secondary | ICD-10-CM | POA: Diagnosis not present

## 2018-12-29 DIAGNOSIS — F10239 Alcohol dependence with withdrawal, unspecified: Secondary | ICD-10-CM | POA: Diagnosis present

## 2018-12-29 DIAGNOSIS — F1021 Alcohol dependence, in remission: Secondary | ICD-10-CM | POA: Diagnosis present

## 2018-12-29 DIAGNOSIS — R079 Chest pain, unspecified: Secondary | ICD-10-CM | POA: Diagnosis not present

## 2018-12-29 DIAGNOSIS — F329 Major depressive disorder, single episode, unspecified: Secondary | ICD-10-CM | POA: Diagnosis present

## 2018-12-29 DIAGNOSIS — F1210 Cannabis abuse, uncomplicated: Secondary | ICD-10-CM | POA: Diagnosis present

## 2018-12-29 DIAGNOSIS — Z79899 Other long term (current) drug therapy: Secondary | ICD-10-CM | POA: Diagnosis not present

## 2018-12-29 DIAGNOSIS — R0789 Other chest pain: Secondary | ICD-10-CM | POA: Diagnosis present

## 2018-12-29 DIAGNOSIS — E872 Acidosis, unspecified: Secondary | ICD-10-CM | POA: Diagnosis present

## 2018-12-29 DIAGNOSIS — I493 Ventricular premature depolarization: Secondary | ICD-10-CM | POA: Diagnosis present

## 2018-12-29 DIAGNOSIS — E876 Hypokalemia: Secondary | ICD-10-CM | POA: Diagnosis present

## 2018-12-29 DIAGNOSIS — F10230 Alcohol dependence with withdrawal, uncomplicated: Secondary | ICD-10-CM | POA: Diagnosis not present

## 2018-12-29 DIAGNOSIS — R112 Nausea with vomiting, unspecified: Secondary | ICD-10-CM | POA: Diagnosis present

## 2018-12-29 DIAGNOSIS — F10939 Alcohol use, unspecified with withdrawal, unspecified: Secondary | ICD-10-CM | POA: Diagnosis present

## 2018-12-29 DIAGNOSIS — Z87891 Personal history of nicotine dependence: Secondary | ICD-10-CM | POA: Diagnosis not present

## 2018-12-29 LAB — SARS CORONAVIRUS 2 BY RT PCR (HOSPITAL ORDER, PERFORMED IN ~~LOC~~ HOSPITAL LAB): SARS Coronavirus 2: NEGATIVE

## 2018-12-29 LAB — BASIC METABOLIC PANEL
Anion gap: 9 (ref 5–15)
BUN: 11 mg/dL (ref 6–20)
CO2: 20 mmol/L — ABNORMAL LOW (ref 22–32)
Calcium: 8 mg/dL — ABNORMAL LOW (ref 8.9–10.3)
Chloride: 108 mmol/L (ref 98–111)
Creatinine, Ser: 0.81 mg/dL (ref 0.61–1.24)
GFR calc Af Amer: >60 mL/min (ref >60)
GFR calc non Af Amer: >60 mL/min (ref >60)
Glucose, Bld: 113 mg/dL — ABNORMAL HIGH (ref 70–99)
Potassium: 3.7 mmol/L (ref 3.5–5.1)
Sodium: 137 mmol/L (ref 135–145)

## 2018-12-29 LAB — LIPID PANEL
Cholesterol: 154 mg/dL (ref 0–200)
HDL: 62 mg/dL (ref >40)
LDL Cholesterol: 81 mg/dL (ref 0–99)
Total CHOL/HDL Ratio: 2.5 RATIO
Triglycerides: 53 mg/dL (ref <150)
VLDL: 11 mg/dL (ref 0–40)

## 2018-12-29 LAB — LACTIC ACID, PLASMA
Lactic Acid, Venous: 0.9 mmol/L (ref 0.5–1.9)
Lactic Acid, Venous: 0.8 mmol/L (ref 0.5–1.9)

## 2018-12-29 LAB — CBC
HCT: 40.1 % (ref 39.0–52.0)
Hemoglobin: 13.2 g/dL (ref 13.0–17.0)
MCH: 30.1 pg (ref 26.0–34.0)
MCHC: 32.9 g/dL (ref 30.0–36.0)
MCV: 91.3 fL (ref 80.0–100.0)
Platelets: 171 10*3/uL (ref 150–400)
RBC: 4.39 MIL/uL (ref 4.22–5.81)
RDW: 13.5 % (ref 11.5–15.5)
WBC: 11.1 10*3/uL — ABNORMAL HIGH (ref 4.0–10.5)
nRBC: 0 % (ref 0.0–0.2)

## 2018-12-29 LAB — HEMOGLOBIN A1C
Hgb A1c MFr Bld: 5.8 % — ABNORMAL HIGH (ref 4.8–5.6)
Mean Plasma Glucose: 119.76 mg/dL

## 2018-12-29 LAB — TROPONIN I (HIGH SENSITIVITY)
Troponin I (High Sensitivity): 11 ng/L (ref <18)
Troponin I (High Sensitivity): 10 ng/L (ref <18)

## 2018-12-29 LAB — HIV ANTIBODY (ROUTINE TESTING W REFLEX): HIV Screen 4th Generation wRfx: NONREACTIVE — AB

## 2018-12-29 MED ORDER — ONDANSETRON HCL 4 MG/2ML IJ SOLN: 4.0000 mg | Freq: Four times a day (QID) | INTRAMUSCULAR | Status: DC | PRN

## 2018-12-29 MED ORDER — LORAZEPAM 2 MG/ML IJ SOLN
1.0000 mg | INTRAMUSCULAR | Status: DC | PRN
  Administered 2018-12-29: 1 mg via INTRAVENOUS
  Administered 2018-12-30: 2 mg via INTRAVENOUS
  Filled 2018-12-29: qty 1

## 2018-12-29 MED ORDER — LORAZEPAM 1 MG PO TABS: 1.0000 mg | ORAL_TABLET | ORAL | Status: DC | PRN

## 2018-12-29 MED ORDER — LORAZEPAM 2 MG/ML IJ SOLN
0.0000 mg | Freq: Four times a day (QID) | INTRAMUSCULAR | Status: DC
  Administered 2018-12-29: 1 mg via INTRAVENOUS
  Filled 2018-12-29 (×2): qty 1

## 2018-12-29 MED ORDER — FOLIC ACID 1 MG PO TABS
1.0000 mg | ORAL_TABLET | Freq: Every day | ORAL | Status: DC
  Administered 2018-12-29 – 2018-12-30 (×2): 1 mg via ORAL
  Filled 2018-12-29 (×2): qty 1

## 2018-12-29 MED ORDER — ADULT MULTIVITAMIN W/MINERALS CH
1.0000 | ORAL_TABLET | Freq: Every day | ORAL | Status: DC
  Administered 2018-12-29 – 2018-12-30 (×2): 1 via ORAL
  Filled 2018-12-29 (×2): qty 1

## 2018-12-29 MED ORDER — MORPHINE SULFATE (PF) 2 MG/ML IV SOLN: 2.0000 mg | INTRAVENOUS | Status: DC | PRN

## 2018-12-29 MED ORDER — ACETAMINOPHEN 325 MG PO TABS: 650.0000 mg | ORAL_TABLET | Freq: Four times a day (QID) | ORAL | Status: DC | PRN

## 2018-12-29 MED ORDER — SODIUM CHLORIDE 0.9 % IV BOLUS
1000.0000 mL | Freq: Once | INTRAVENOUS | Status: AC
  Administered 2018-12-29: 1000 mL via INTRAVENOUS

## 2018-12-29 MED ORDER — VITAMIN B-1 100 MG PO TABS
100.0000 mg | ORAL_TABLET | Freq: Every day | ORAL | Status: DC
  Administered 2018-12-29 – 2018-12-30 (×2): 100 mg via ORAL
  Filled 2018-12-29 (×2): qty 1

## 2018-12-29 MED ORDER — NITROGLYCERIN 0.4 MG SL SUBL: 0.4000 mg | SUBLINGUAL_TABLET | SUBLINGUAL | Status: DC | PRN

## 2018-12-29 MED ORDER — ACETAMINOPHEN 650 MG RE SUPP: 650.0000 mg | Freq: Four times a day (QID) | RECTAL | Status: DC | PRN

## 2018-12-29 MED ORDER — ALBUTEROL SULFATE HFA 108 (90 BASE) MCG/ACT IN AERS
2.0000 | INHALATION_SPRAY | RESPIRATORY_TRACT | Status: DC | PRN
  Filled 2018-12-29: qty 6.7

## 2018-12-29 MED ORDER — POTASSIUM CHLORIDE CRYS ER 20 MEQ PO TBCR
20.0000 meq | EXTENDED_RELEASE_TABLET | Freq: Once | ORAL | Status: AC
  Administered 2018-12-29: 20 meq via ORAL
  Filled 2018-12-29: qty 1

## 2018-12-29 MED ORDER — ASPIRIN EC 81 MG PO TBEC
81.0000 mg | DELAYED_RELEASE_TABLET | Freq: Every day | ORAL | Status: DC
  Administered 2018-12-29 – 2018-12-30 (×2): 81 mg via ORAL
  Filled 2018-12-29 (×2): qty 1

## 2018-12-29 MED ORDER — THIAMINE HCL 100 MG/ML IJ SOLN: 100.0000 mg | Freq: Every day | INTRAMUSCULAR | Status: DC

## 2018-12-29 MED ORDER — VENLAFAXINE HCL ER 37.5 MG PO CP24
37.5000 mg | ORAL_CAPSULE | Freq: Every day | ORAL | Status: DC
  Administered 2018-12-29 – 2018-12-30 (×2): 37.5 mg via ORAL
  Filled 2018-12-29 (×3): qty 1

## 2018-12-29 MED ORDER — ROSUVASTATIN CALCIUM 20 MG PO TABS
20.0000 mg | ORAL_TABLET | Freq: Every day | ORAL | Status: DC
  Administered 2018-12-29 – 2018-12-30 (×2): 20 mg via ORAL
  Filled 2018-12-29 (×2): qty 1

## 2018-12-29 MED ORDER — DEXTROSE-NACL 5-0.45 % IV SOLN
INTRAVENOUS | Status: DC
  Administered 2018-12-29: 75 mL/h via INTRAVENOUS
  Administered 2018-12-30: 06:00:00 via INTRAVENOUS

## 2018-12-29 MED ORDER — LORAZEPAM 2 MG/ML IJ SOLN: 0.0000 mg | Freq: Two times a day (BID) | INTRAMUSCULAR | Status: DC

## 2018-12-29 MED ORDER — SODIUM CHLORIDE 0.9 % IV SOLN
INTRAVENOUS | Status: DC
  Administered 2018-12-29: 10:00:00 via INTRAVENOUS
  Administered 2018-12-29: 125 mL/h via INTRAVENOUS

## 2018-12-29 MED ORDER — ONDANSETRON HCL 4 MG PO TABS: 4.0000 mg | ORAL_TABLET | Freq: Four times a day (QID) | ORAL | Status: DC | PRN

## 2018-12-29 MED ORDER — ENOXAPARIN SODIUM 40 MG/0.4ML ~~LOC~~ SOLN
40.0000 mg | SUBCUTANEOUS | Status: DC
  Administered 2018-12-29: 40 mg via SUBCUTANEOUS
  Filled 2018-12-29 (×2): qty 0.4

## 2018-12-29 MED ORDER — ALBUTEROL SULFATE (2.5 MG/3ML) 0.083% IN NEBU: 2.5000 mg | INHALATION_SOLUTION | RESPIRATORY_TRACT | Status: DC | PRN

## 2018-12-29 NOTE — Progress Notes (Signed)
PROGRESS NOTE    Ray Case  BJY:[id number] DOB: 16-Oct-1965 DOA: 12/28/2018 PCP: Libby Maw, MD    Brief Narrative:  53 year old male who presented with shortness of breath, chest pain, nausea, vomiting and diarrhea.  He does have significant past medical history for dyslipidemia, tobacco abuse, former smoker, marijuana abuse.  24 hours prior to hospitalization he had chest pain, dyspnea and chills.  On his initial physical examination blood pressure 136/81, pulse rate 98, respirate 21, oxygen saturation 100%.  His lungs are clear to auscultation bilaterally, heart is also present rhythmic, abdomen soft nontender, no lower extremity edema.  Sodium 141, potassium 3.3, chloride 105, bicarb 15, glucose 151, BUN 12, creatinine 0.93, anion gap 21, white count 10.4, hemoglobin 15.5, hematocrit 46.2, platelets 219.  SARS COVID-19 was negative.  Urinalysis negative for infection.  Toxicology positive for tetrahydrocannabinol.  Alcohol level less than 10.  His a chest radiograph was negative for infiltrates.  EKG 95 bpm, normal axis, normal intervals, sinus rhythm with positive PVCs, no ST segment T wave changes, poor with progression.  Patient was admitted to the hospital with a working diagnosis of atypical chest pain, in the setting of acute alcohol withdrawal.  Assessment & Plan:   Principal Problem:   Alcohol withdrawal ([place]) Active Problems:   Nausea, vomiting and diarrhea   SOB (shortness of breath)   Chest pain   Hypokalemia   Hypomagnesemia   Lactic acidosis   Alcohol abuse   1. Acute alcohol withdrawal with nausea, vomiting and diarrhea. Patient has received 3 mg of IV lorazepam this am, now his symptoms have improved but not back to baseline, no nausea or vomiting. Resting HR 80 to 82. Will continue with lorazepam per CIWA protocol, along with IV fluids, thiamine, folic acid and multivitamins and thiamine.   2. Atypical chest pain. Follow up ekg with no ischemic changes,  high sensitive troponin negative. Ruled out acute coronary syndrome.  3. Acute metabolic acidosis/ alcoholic ketoacidosis/ hypokalemia and hypomagnesemia. Serum bicarbonate up to 20, with high chloride at 113, K at 3,7 and anion gap down to 9. Will continue hydration with dextrose and half normal saline at 75 ml per H, will follow with renal panel in am. Will continue K correction with Kcl, check Mg in am.  4. Dyslipidemia. Continue with rosuvastatin.  5. Depression. Continue with venlafaxine.    DVT prophylaxis: enoxaparin   Code Status:  full Family Communication: no family at the bedside  Disposition Plan/ discharge barriers: patient with withdrawal symptoms, high risk for worsening symptoms, required IV lorazepam. Patient will require more than 2 midnights of hospitalization. Will change to inpatient.   Body mass index is 27.89 kg/m. Malnutrition Type:      Malnutrition Characteristics:      Nutrition Interventions:     RN Pressure Injury Documentation:     Consultants:     Procedures:     Antimicrobials:       Subjective: Patient with improved anxiety but not yet back to baseline, no nausea or vomiting. No chest pain or dyspnea.   Objective: Vitals:   12/29/18 1100 12/29/18 1130 12/29/18 1230 12/29/18 1300  BP: 125/77 140/81 118/70 125/79  Pulse: 85 70 77 73  Resp: 17 15 18 15   Temp:      TempSrc:      SpO2: 99% 100% 100% 100%  Weight:      Height:        Intake/Output Summary (Last 24 hours) at 12/29/2018 1506 Last data  filed at 12/29/2018 7494 Gross per 24 hour  Intake 3150 ml  Output -  Net 3150 ml   Filed Weights   12/28/18 1738  Weight: 90.7 kg    Examination:   General: Not in pain or dyspnea, deconditioned  Neurology: Awake and alert, non focal  E ENT: no pallor, no icterus, oral mucosa dry.  Cardiovascular: No JVD. S1-S2 present, rhythmic, no gallops, rubs, or murmurs. No lower extremity edema. Pulmonary: positive breath  sounds bilaterally, adequate air movement, no wheezing, rhonchi or rales. Gastrointestinal. Abdomen with no organomegaly, non tender, no rebound or guarding Skin. No rashes Musculoskeletal: no joint deformities     Data Reviewed: I have personally reviewed following labs and imaging studies  CBC: Recent Labs  Lab 12/28/18 1756 12/29/18 0852  WBC 10.4 11.1*  HGB 15.5 13.2  HCT 46.2 40.1  MCV 89.5 91.3  PLT 219 171   Basic Metabolic Panel: Recent Labs  Lab 12/28/18 1756 12/28/18 2116 12/29/18 0852  NA 141  --  137  K 3.3*  --  3.7  CL 105  --  108  CO2 15*  --  20*  GLUCOSE 151*  --  113*  BUN 12  --  11  CREATININE 0.93  --  0.81  CALCIUM 9.7  --  8.0*  MG  --  1.6*  --    GFR: Estimated Creatinine Clearance: 120.2 mL/min (by C-G formula based on SCr of 0.81 mg/dL). Liver Function Tests: Recent Labs  Lab 12/28/18 1756  AST 27  ALT 25  ALKPHOS 61  BILITOT 1.0  PROT 8.2*  ALBUMIN 4.9   Recent Labs  Lab 12/28/18 1756  LIPASE 22   No results for input(s): AMMONIA in the last 168 hours. Coagulation Profile: No results for input(s): INR, PROTIME in the last 168 hours. Cardiac Enzymes: Recent Labs  Lab 12/28/18 1825  CKTOTAL 125   BNP (last 3 results) No results for input(s): PROBNP in the last 8760 hours. HbA1C: Recent Labs    12/29/18 0852  HGBA1C 5.8*   CBG: No results for input(s): GLUCAP in the last 168 hours. Lipid Profile: Recent Labs    12/29/18 0852  CHOL 154  HDL 62  LDLCALC 81  TRIG 53  CHOLHDL 2.5   Thyroid Function Tests: No results for input(s): TSH, T4TOTAL, FREET4, T3FREE, THYROIDAB in the last 72 hours. Anemia Panel: No results for input(s): VITAMINB12, FOLATE, FERRITIN, TIBC, IRON, RETICCTPCT in the last 72 hours.    Radiology Studies: I have reviewed all of the imaging during this hospital visit personally     Scheduled Meds: . aspirin EC  81 mg Oral Daily  . enoxaparin (LOVENOX) injection  40 mg  Subcutaneous Q24H  . folic acid  1 mg Oral Daily  . LORazepam  0-4 mg Intravenous Q6H   Followed by  . [START ON 12/31/2018] LORazepam  0-4 mg Intravenous Q12H  . multivitamin with minerals  1 tablet Oral Daily  . rosuvastatin  20 mg Oral Daily  . thiamine  100 mg Oral Daily   Or  . thiamine  100 mg Intravenous Daily  . venlafaxine XR  37.5 mg Oral Q breakfast   Continuous Infusions: . sodium chloride 125 mL/hr at 12/29/18 1028     LOS: 0 days        Iman Reinertsen Annett Gula, MD

## 2018-12-29 NOTE — ED Notes (Signed)
MD verbal order to transport pt upstairs without telemetry monitoring

## 2018-12-29 NOTE — ED Notes (Signed)
Transport called to take pt upstairs 

## 2018-12-29 NOTE — ED Notes (Signed)
ED TO INPATIENT HANDOFF REPORT  Name/Age/Gender Ray Case 53 y.o. male  Code Status    Code Status Orders  (From admission, onward)         Start     Ordered   12/29/18 0835  Full code  Continuous     12/29/18 0834        Code Status History    This patient has a current code status but no historical code status.   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 Achilles tendon rupture 2014   Left  . Complication of anesthesia    Pt had previouds experience of feeling pain after local anesthesia  . Headache(784.0)   . Hyperlipidemia   . Tuberculosis    Pt exposed and medicated  1990's    Allergies No Known Allergies  IV Location/Drains/Wounds Patient Lines/Drains/Airways Status   Active Line/Drains/Airways    Name:   Placement date:   Placement time:   Site:   Days:   Peripheral IV 12/28/18 Left Antecubital   12/28/18    2136    Antecubital   1   Incision 02/24/13 Ankle Left   02/24/13    1129     2134          Labs/Imaging Results for orders placed or performed during the hospital encounter of 12/28/18 (from the past 48 hour(s))  Lipase, blood     Status: None    Collection Time: 12/28/18  5:56 PM  Result Value Ref Range   Lipase 22 11 - 51 U/L    Comment: Performed at [facility], [address] [place], [place] [address]  Comprehensive metabolic panel     Status: Abnormal   Collection Time: 12/28/18  5:56 PM  Result Value Ref Range   Sodium 141 135 - 145 mmol/L   Potassium 3.3 (L) 3.5 - 5.1 mmol/L   Chloride 105 98 - 111 mmol/L   CO2 15 (L) 22 - 32 mmol/L   Glucose, Bld 151 (H) 70 - 99 mg/dL   BUN 12 6 - 20 mg/dL   Creatinine, Ser 8.29 0.61 - 1.24 mg/dL   Calcium 9.7 8.9 - 56.2 mg/dL   Total Protein 8.2 (H) 6.5 - 8.1 g/dL   Albumin 4.9 3.5 - 5.0 g/dL   AST 27 15 - 41 U/L   ALT 25 0 - 44 U/L   Alkaline Phosphatase 61 38 - 126 U/L   Total Bilirubin 1.0 0.3 - 1.2 mg/dL   GFR calc non Af Amer >60 >60 mL/min   GFR calc Af Amer >60 >60 mL/min   Anion gap 21 (H) 5 - 15    Comment: REPEATED TO VERIFY Performed at [facility],  [address], [place], [place] [address]   CBC     Status: None   Collection Time: 12/28/18  5:56 PM  Result Value Ref Range   WBC 10.4 4.0 - 10.5 K/uL   RBC 5.16 4.22 - 5.81 MIL/uL   Hemoglobin 15.5 13.0 - 17.0 g/dL   HCT 60.4 54.0 - 98.1 %   MCV 89.5 80.0 - 100.0 fL   MCH 30.0 26.0 - 34.0 pg   MCHC 33.5 30.0 - 36.0 g/dL   RDW 19.1 47.8 - 29.5 %   Platelets 219 150 - 400 K/uL   nRBC 0.0 0.0 - 0.2 %    Comment: Performed at [facility], [address], [place], [place] [address]  Urinalysis, Routine w reflex microscopic     Status: Abnormal   Collection Time: 12/28/18  5:56 PM  Result Value Ref Range   Color, Urine YELLOW YELLOW   APPearance CLEAR CLEAR   Specific Gravity, Urine 1.015 1.005 - 1.030   pH 8.0 5.0 - 8.0   Glucose, UA 50 (A) NEGATIVE mg/dL   Hgb urine dipstick NEGATIVE NEGATIVE   Bilirubin Urine NEGATIVE NEGATIVE   Ketones, ur 80 (A) NEGATIVE mg/dL   Protein, ur NEGATIVE NEGATIVE mg/dL   Nitrite NEGATIVE NEGATIVE   Leukocytes,Ua  NEGATIVE NEGATIVE    Comment: Performed at [facility], [address] [place], [place] [address]  CK     Status: None   Collection Time: 12/28/18  6:25 PM  Result Value Ref Range   Total CK 125 49 - 397 U/L    Comment: Performed at [facility], [address], [place], [place] [address]  Ethanol     Status: None   Collection Time: 12/28/18  6:25 PM  Result Value Ref Range   Alcohol, Ethyl (B) <10 <10 mg/dL    Comment: (NOTE) Lowest detectable limit for serum alcohol is 10 mg/dL. For medical purposes only. Performed at [facility], [address], [place], [place] [address]   Rapid urine drug screen (hospital performed)     Status: Abnormal   Collection Time: 12/28/18  6:25 PM  Result Value Ref Range   Opiates NONE DETECTED NONE DETECTED   Cocaine NONE DETECTED NONE DETECTED   Benzodiazepines NONE DETECTED NONE DETECTED   Amphetamines NONE DETECTED NONE DETECTED   Tetrahydrocannabinol POSITIVE (A) NONE DETECTED   Barbiturates NONE DETECTED NONE DETECTED    Comment: (NOTE) DRUG SCREEN FOR MEDICAL PURPOSES ONLY.  IF CONFIRMATION IS NEEDED FOR ANY PURPOSE, NOTIFY LAB WITHIN 5 DAYS. LOWEST DETECTABLE LIMITS FOR URINE DRUG SCREEN Drug Class                     Cutoff (ng/mL) Amphetamine and metabolites    1000 Barbiturate and metabolites    200 Benzodiazepine                 200 Tricyclics and metabolites     300 Opiates and metabolites        300 Cocaine and metabolites        300 THC                            50 Performed at [facility], [address], [place], [place] [address]   Blood gas, venous (at [facility] and [facility], not at [facility])     Status: Abnormal   Collection Time: 12/28/18  7:35 PM  Result Value Ref Range   FIO2 21.00    Delivery systems ROOM AIR    pH, Ven 7.458 (H) 7.250 - 7.430   pCO2, Ven 31.2 (L) 44.0 - 60.0 mmHg   pO2, Ven BELOW REPORTABLE RANGE 32.0 - 45.0 mmHg    Comment:  CRITICAL RESULT CALLED TO, READ BACK BY AND VERIFIED WITH:  [place] GARRISON, RN AT [phone] BY JESSICA NEUGENT, RRT, RCP ON 12/28/2018    Bicarbonate 21.7 20.0 - 28.0 mmol/L   Acid-base deficit 0.8 0.0 - 2.0 mmol/L   O2 Saturation 52.2 %   Patient temperature 37.0    Collection site VEIN    Drawn by COLLECTED BY NURSE    Sample type VENOUS     Comment: Performed at [facility], [address], [place], [place] [address]  Lactic acid, plasma     Status: Abnormal   Collection Time: 12/28/18  7:38 PM  Result Value Ref Range   Lactic Acid, Venous 2.5 (HH) 0.5 - 1.9 mmol/L    Comment: CRITICAL RESULT CALLED TO, READ BACK BY AND VERIFIED WITH: HODGES,I @ 2059 ON [id number] BY POTEAT,S Performed at [facility], [address], [place], [place] [address]   Lactic acid, plasma     Status: Abnormal   Collection Time: 12/28/18  9:10 PM  Result Value Ref Range   Lactic Acid, Venous 2.8 (HH) 0.5 - 1.9 mmol/L    Comment: CRITICAL VALUE NOTED.  VALUE IS CONSISTENT WITH PREVIOUSLY REPORTED AND CALLED VALUE. Performed at [facility], [address], [place], [place] [address]   Troponin I (High Sensitivity)     Status: None   Collection Time: 12/28/18  9:16 PM  Result Value Ref Range   Troponin I (High Sensitivity) 4 <18 ng/L    Comment: (NOTE) Elevated high sensitivity troponin I (hsTnI) values and significant  changes across serial measurements may suggest ACS but many other  chronic and acute conditions are known to elevate hsTnI results.  Refer to the "Links" section for chest pain algorithms and additional  guidance. Performed at [facility], [address], [place], [place] [address]   Magnesium     Status: Abnormal   Collection Time: 12/28/18  9:16 PM  Result Value Ref Range   Magnesium 1.6 (L) 1.7 - 2.4 mg/dL    Comment: Performed at [facility], [address] [place], [place] [address]   SARS Coronavirus 2 [facility] order, Performed in [facility] hospital lab) Nasopharyngeal Nasopharyngeal Swab     Status: None   Collection Time: 12/28/18 10:15 PM   Specimen: Nasopharyngeal Swab  Result Value Ref Range   SARS Coronavirus 2 NEGATIVE NEGATIVE    Comment: (NOTE) If result is NEGATIVE SARS-CoV-2 target nucleic acids are NOT DETECTED. The SARS-CoV-2 RNA is generally detectable in upper and lower  respiratory specimens during the acute phase of infection. The lowest  concentration of SARS-CoV-2 viral copies this assay can detect is 250  copies / mL. A negative result does not preclude SARS-CoV-2 infection  and should not be used as the sole basis for treatment or other  patient management decisions.  A negative result may occur with  improper specimen collection / handling, submission of specimen other  than nasopharyngeal swab, presence of viral mutation(s) within the  areas targeted by this assay, and inadequate number of viral copies  (<250 copies / mL). A negative result must be combined with clinical  observations, patient history, and epidemiological information. If result is POSITIVE SARS-CoV-2 target nucleic acids are DETECTED. The SARS-CoV-2 RNA is generally detectable in upper and lower  respiratory specimens dur ing the acute phase of infection.  Positive  results are indicative of active infection with SARS-CoV-2.  Clinical  correlation with patient history and other diagnostic information is  necessary to determine patient infection status.  Positive results do  not rule out bacterial infection or co-infection with other viruses. If result is PRESUMPTIVE POSTIVE SARS-CoV-2 nucleic acids MAY BE PRESENT.   A presumptive positive result was obtained on the submitted specimen  and confirmed on repeat testing.  While 2019 novel coronavirus  (SARS-CoV-2) nucleic acids may be present in the submitted sample  additional confirmatory testing may be necessary for  epidemiological  and / or clinical management purposes  to differentiate between  SARS-CoV-2 and other Sarbecovirus currently known to infect humans.  If clinically indicated additional testing with an alternate test  methodology [phone]) is advised. The SARS-CoV-2 RNA is generally  detectable in upper and lower respiratory sp ecimens during the acute  phase of infection. The expected result is Negative. Fact Sheet for Patients:  BoilerBrush.com.cy Fact Sheet for Healthcare Providers: https://pope.com/ This test is not yet approved or cleared by the [place] FDA and has been authorized for detection and/or diagnosis of SARS-CoV-2 by FDA under an Emergency Use Authorization (EUA).  This EUA will remain in effect (meaning this test can be used) for the duration of the COVID-19 declaration under Section 564(b)(1) of the Act, 21 U.S.C. section 360bbb-3(b)(1), unless the authorization is terminated or revoked sooner. Performed at [facility], [address] [place], [place] [address]   Troponin I (High Sensitivity)     Status: None   Collection Time: 12/29/18  4:11 AM  Result Value Ref Range   Troponin I (High Sensitivity) 11 <18 ng/L    Comment: (NOTE) Elevated high sensitivity troponin I (hsTnI) values and significant  changes across serial measurements may suggest ACS but many other  chronic and acute conditions are known to elevate hsTnI results.  Refer to the "Links" section for chest pain algorithms and additional  guidance. Performed at [facility], [address], [place], [place] [address]   Lactic acid, plasma     Status: None   Collection Time: 12/29/18  4:11 AM  Result Value Ref Range   Lactic Acid, Venous 0.9 0.5 - 1.9 mmol/L    Comment: Performed at [facility], [address], [place], [place] [address]  Lactic acid, plasma     Status: None   Collection  Time: 12/29/18  8:52 AM  Result Value Ref Range   Lactic Acid, Venous 0.8 0.5 - 1.9 mmol/L    Comment: Performed at [facility], [address] [place], [place] [address]  Troponin I (High Sensitivity)     Status: None   Collection Time: 12/29/18  8:52 AM  Result Value Ref Range   Troponin I (High Sensitivity) 10 <18 ng/L    Comment: (NOTE) Elevated high sensitivity troponin I (hsTnI) values and significant  changes across serial measurements may suggest ACS but many other  chronic and acute conditions are known to elevate hsTnI results.  Refer to the "Links" section for chest pain algorithms and additional  guidance. Performed at [facility], [address] [place], [place] [address]   Hemoglobin A1c     Status: Abnormal   Collection  Time: 12/29/18  8:52 AM  Result Value Ref Range   Hgb A1c MFr Bld 5.8 (H) 4.8 - 5.6 %    Comment: (NOTE) Pre diabetes:          5.7%-6.4% Diabetes:              >6.4% Glycemic control for   <7.0% adults with diabetes    Mean Plasma Glucose 119.76 mg/dL    Comment: Performed at [facility], [address] [place], [place] [address]  Lipid panel     Status: None   Collection Time: 12/29/18  8:52 AM  Result Value Ref Range   Cholesterol 154 0 - 200 mg/dL   Triglycerides 53 <621 mg/dL   HDL 62 >30 mg/dL   Total CHOL/HDL Ratio 2.5 RATIO   VLDL 11 0 - 40 mg/dL   LDL Cholesterol 81 0 - 99 mg/dL    Comment:        Total Cholesterol/HDL:CHD Risk Coronary Heart Disease Risk Table                     Men   Women  1/2 Average Risk   3.4   3.3  Average Risk       5.0   4.4  2 X Average Risk   9.6   7.1  3 X Average Risk  23.4   11.0        Use the calculated Patient Ratio above and the CHD Risk Table to determine the patient's CHD Risk.        ATP III CLASSIFICATION (LDL):  <100     mg/dL   Optimal  865-784  mg/dL   Near or Above                    Optimal  130-159  mg/dL   Borderline  696-295   mg/dL   High  >284     mg/dL   Very High Performed at [facility], [address], [place], [place] [address]   HIV Antibody  (Routine Testing)     Status: Abnormal   Collection Time: 12/29/18  8:52 AM  Result Value Ref Range   HIV Screen 4th Generation wRfx Non Reactive (A) Non Reactive    Comment: (NOTE) Performed At: [facility] [address] [place], [place] [id number] Jolene Schimke MD UY:[id number]   Basic metabolic panel     Status: Abnormal   Collection Time: 12/29/18  8:52 AM  Result Value Ref Range   Sodium 137 135 - 145 mmol/L   Potassium 3.7 3.5 - 5.1 mmol/L   Chloride 108 98 - 111 mmol/L   CO2 20 (L) 22 - 32 mmol/L   Glucose, Bld 113 (H) 70 - 99 mg/dL   BUN 11 6 - 20 mg/dL   Creatinine, Ser 6.38 0.61 - 1.24 mg/dL   Calcium 8.0 (L) 8.9 - 10.3 mg/dL   GFR calc non Af Amer >60 >60 mL/min   GFR calc Af Amer >60 >60 mL/min   Anion gap 9 5 - 15    Comment: Performed at [facility], [address] [place], [place] [address]  CBC     Status: Abnormal   Collection Time: 12/29/18  8:52 AM  Result Value Ref Range   WBC 11.1 (H) 4.0 - 10.5 K/uL   RBC 4.39 4.22 - 5.81 MIL/uL   Hemoglobin 13.2 13.0 - 17.0 g/dL   HCT 32.9 51.8 -  52.0 %   MCV 91.3 80.0 - 100.0 fL   MCH 30.1 26.0 - 34.0 pg   MCHC 32.9 30.0 - 36.0 g/dL   RDW 13.5 11.5 - 15.5 %   Platelets 171 150 - 400 K/uL    Comment: REPEATED TO VERIFY PLATELET COUNT CONFIRMED BY SMEAR SPECIMEN CHECKED FOR CLOTS    nRBC 0.0 0.0 - 0.2 %    Comment: Performed at [facility], [place] [address] [place], [place] [address]   Dg Chest Portable 1 View  Result Date: 12/28/2018 CLINICAL DATA:  53 y.o male BIBA from home. Pt was feeling his normal this morning, then started having SHOB with chills, diaphoresis this afternoon. Sats originally 99%, then 76%. Pt went to tripod positioning with EMS. Pt was put on nonrebreather with EMS. Hx TB. EXAM: PORTABLE CHEST 1  VIEW COMPARISON:  None. FINDINGS: The heart size and mediastinal contours are within normal limits. The lungs are clear. No pneumothorax or pleural effusion. The visualized skeletal structures are unremarkable. IMPRESSION: No evidence of active disease. Electronically Signed   By: Audie Pinto M.D.   On: 12/28/2018 18:48   Ct Angio Chest/abd/pel For Dissection W And/or Wo Contrast  Result Date: 12/28/2018 CLINICAL DATA:  Chest pain, acute aortic syndrome suspected, hemodynamically stable. EXAM: CT ANGIOGRAPHY CHEST, ABDOMEN AND PELVIS TECHNIQUE: Multidetector CT imaging through the chest, abdomen and pelvis was performed using the standard protocol during bolus administration of intravenous contrast. Multiplanar reconstructed images and MIPs were obtained and reviewed to evaluate the vascular anatomy. CONTRAST:  186mL OMNIPAQUE IOHEXOL 350 MG/ML SOLN COMPARISON:  Chest radiograph 12/28/2018 FINDINGS: CTA CHEST FINDINGS Cardiovascular: Noncontrast CT of the chest reveals no [name] hyperdense mural thickening to suggest intramural hematoma. Postcontrast evaluation of the chest demonstrates slight preferential opacification of pulmonary arteries over contrast bolus of the aorta remains of diagnostic utility. The aortic root is suboptimally assessed given cardiac pulsation artifact. The aorta is normal caliber. No intramural hematoma, dissection flap or other acute luminal abnormality of the aorta is seen. No periaortic stranding or hemorrhage. Normal 3 vessel arch. Proximal great vessels and subclavian arteries free of acute abnormality. Mild tortuosity of the right brachiocephalic vessels is present. Central pulmonary arteries are well opacified. No central or lobar filling defects are seen. More distal evaluation is limited by respiratory motion artifact. Normal heart size. No pericardial effusion. Mediastinum/Nodes: No enlarged mediastinal or axillary lymph nodes. Thyroid gland, trachea, and esophagus  demonstrate no significant findings. Lungs/Pleura: No consolidation, features of edema, pneumothorax, or effusion. No suspicious pulmonary nodules or masses. Evaluation is limited by respiratory motion artifact of the lung parenchyma. Musculoskeletal: No acute or suspicious osseous lesions present within the spine. Multilevel degenerative changes are present in the imaged portions of the spine. Few Schmorl's nodes are seen. Review of the MIP images confirms the above findings. CTA ABDOMEN AND PELVIS FINDINGS VASCULAR Aorta: Normal caliber aorta without aneurysm, dissection, vasculitis or significant stenosis. Celiac: Patent without evidence of aneurysm, dissection, vasculitis or significant stenosis. SMA: Patent without evidence of aneurysm, dissection, vasculitis or significant stenosis. Renals: 2 right renal arteries, single left renal artery. The arteries are patent without evidence of aneurysm, dissection, vasculitis, fibromuscular dysplasia or significant stenosis. IMA: Patent without evidence of aneurysm, dissection, vasculitis or significant stenosis. Inflow: Patent without evidence of aneurysm, dissection, vasculitis or significant stenosis. Veins: No obvious venous abnormality within the limitations of this arterial phase study. Review of the MIP images confirms the above findings. NON-VASCULAR Hepatobiliary: No focal liver  abnormality is seen. No gallstones, gallbladder wall thickening, or biliary dilatation. Pancreas: Unremarkable. No pancreatic ductal dilatation or surrounding inflammatory changes. Spleen: Normal in size without focal abnormality. Adrenals/Urinary Tract: Adrenal glands are unremarkable. Kidneys are normal, without renal calculi, focal lesion, or hydronephrosis. Mild bladder wall thickening versus underdistention. Stomach/Bowel: Distal esophagus, stomach and duodenal sweep are unremarkable. No bowel wall thickening or dilatation. No evidence of obstruction. A normal appendix is  visualized. No colonic dilatation or wall thickening. Lymphatic: No suspicious or enlarged lymph nodes in the included lymphatic chains. Reproductive: The prostate and seminal vesicles are unremarkable. Other: No abdominopelvic free fluid or free gas. No bowel containing hernias. Musculoskeletal: Multilevel degenerative changes are present in the imaged portions of the spine. There are bilateral L4 pars defects with grade 2 anterolisthesis of L4 on L5 and extensive sclerotic endplate changes. Question remote fracture versus degenerative change of the L3 spinous process. 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 Degenerative changes are present in both hips. No acute osseous abnormality or suspicious osseous lesion. Review of the MIP images confirms the above findings. IMPRESSION: 1. Motion degraded exam secondary to both patient and respiratory motion artifact. 2. No evidence of acute aortic syndrome. No acute intrathoracic process. 3. Mild circumferential bladder wall thickening versus underdistention. Correlate with urinary symptoms and consider urinalysis. 4. Multilevel degenerative changes in the spine. 5. 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 Extensive associated sclerotic endplate changes. 6. 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 Electronically Signed   By: Kreg Shropshire M.D.   On: 12/28/2018 23:01    Pending Labs Unresulted Labs (From admission, onward)   None      Vitals/Pain Today's Vitals   12/29/18 1130 12/29/18 1230 12/29/18 1300 12/29/18 1514  BP: 140/81 118/70 125/79 128/84  Pulse: 70 77 73 81  Resp: Temp:      TempSrc:      SpO2: 100% 100% 100% 98%  Weight:      Height:      PainSc:        Isolation Precautions No active isolations  Medications Medications  sodium chloride (PF) 0.9 % injection (has no administration in time range)  rosuvastatin (CRESTOR) tablet 20 mg (20 mg Oral Given 12/29/18 1026)   venlafaxine XR (EFFEXOR-XR) 24 hr capsule 37.5 mg (37.5 mg Oral Given 12/29/18 0958)  aspirin EC tablet 81 mg (81 mg Oral Given 12/29/18 0958)  nitroGLYCERIN (NITROSTAT) SL tablet 0.4 mg (has no administration in time range)  morphine 2 MG/ML injection 2 mg (has no administration in time range)  albuterol (VENTOLIN HFA) 108 (90 Base) MCG/ACT inhaler 2 puff (has no administration in time range)  0.9 %  sodium chloride infusion ( Intravenous New Bag/Given 12/29/18 1028)  LORazepam (ATIVAN) tablet 1-4 mg (has no administration in time range)    Or  LORazepam (ATIVAN) injection 1-4 mg (has no administration in time range)  thiamine (VITAMIN B-1) tablet 100 mg (100 mg Oral Given 12/29/18 0958)    Or  thiamine (B-1) injection 100 mg ( Intravenous See Alternative 12/29/18 0958)  folic acid (FOLVITE) tablet 1 mg (1 mg Oral Given 12/29/18 0958)  multivitamin with minerals tablet 1 tablet (1 tablet Oral Given 12/29/18 0957)  LORazepam (ATIVAN) injection 0-4 mg (1 mg Intravenous Given 12/29/18 0959)    Followed by  LORazepam (ATIVAN) injection 0-4 mg (has no administration in time range)  enoxaparin (LOVENOX) injection 40 mg (has no  administration in time range)  acetaminophen (TYLENOL) tablet 650 mg (has no administration in time range)    Or  acetaminophen (TYLENOL) suppository 650 mg (has no administration in time range)  ondansetron (ZOFRAN) tablet 4 mg (has no administration in time range)    Or  ondansetron (ZOFRAN) injection 4 mg (has no administration in time range)  sodium chloride flush (NS) 0.9 % injection 3 mL (3 mLs Intravenous Given 12/28/18 1818)  LORazepam (ATIVAN) injection 1 mg (1 mg Intravenous Given 12/28/18 1825)  sodium chloride 0.9 % bolus 1,000 mL (0 mLs Intravenous Stopped 12/28/18 2135)  LORazepam (ATIVAN) injection 1 mg (1 mg Intravenous Given 12/28/18 2136)  sodium chloride 0.9 % bolus 1,000 mL (0 mLs Intravenous Stopped 12/28/18 2303)  HYDROmorphone (DILAUDID) injection 0.5 mg  (0.5 mg Intravenous Given 12/28/18 2254)  magnesium sulfate IVPB 2 g 50 mL (0 g Intravenous Stopped 12/29/18 0010)  potassium chloride 10 mEq in 100 mL IVPB (0 mEq Intravenous Stopped 12/29/18 0300)  iohexol (OMNIPAQUE) 350 MG/ML injection 100 mL (100 mLs Intravenous Contrast Given 12/28/18 2237)  potassium chloride SA (K-DUR) CR tablet 20 mEq (20 mEq Oral Given 12/29/18 0958)  sodium chloride 0.9 % bolus 1,000 mL (0 mLs Intravenous Stopped 12/29/18 0625)    Mobility walks

## 2018-12-30 DIAGNOSIS — R112 Nausea with vomiting, unspecified: Secondary | ICD-10-CM

## 2018-12-30 DIAGNOSIS — R197 Diarrhea, unspecified: Secondary | ICD-10-CM

## 2018-12-30 DIAGNOSIS — R072 Precordial pain: Secondary | ICD-10-CM

## 2018-12-30 LAB — BASIC METABOLIC PANEL
Anion gap: 8 (ref 5–15)
BUN: 9 mg/dL (ref 6–20)
CO2: 20 mmol/L — ABNORMAL LOW (ref 22–32)
Calcium: 8.2 mg/dL — ABNORMAL LOW (ref 8.9–10.3)
Chloride: 110 mmol/L (ref 98–111)
Creatinine, Ser: 0.83 mg/dL (ref 0.61–1.24)
GFR calc Af Amer: >60 mL/min (ref >60)
GFR calc non Af Amer: >60 mL/min (ref >60)
Glucose, Bld: 97 mg/dL (ref 70–99)
Potassium: 4.4 mmol/L (ref 3.5–5.1)
Sodium: 138 mmol/L (ref 135–145)

## 2018-12-30 LAB — PHOSPHORUS: Phosphorus: 2.5 mg/dL (ref 2.5–4.6)

## 2018-12-30 LAB — MAGNESIUM: Magnesium: 1.9 mg/dL (ref 1.7–2.4)

## 2018-12-30 NOTE — Progress Notes (Signed)
Discharge instructions given to patient and patient verbalizes understanding. Patient with no complaints at the current time. Will d/c via WC.

## 2018-12-30 NOTE — TOC Transition Note (Signed)
Transition of Care [facility]) - CM/SW Discharge Note   Patient Details  Name: Ray Case MRN: [id number] Date of Birth: November 04, 1965  Transition of Care [facility]) CM/SW Contact:  Dessa Phi, RN Phone Number: 12/30/2018, 12:20 PM   Clinical Narrative: Referral for etoh resources-provided patient w/otpt etoh resources-patient voiced understanding. No further CM needs.            Patient Goals and CMS Choice        Discharge Placement                       Discharge Plan and Services                                     Social Determinants of Health (SDOH) Interventions     Readmission Risk Interventions No flowsheet data found.

## 2018-12-30 NOTE — Discharge Instructions (Signed)
Alcohol Withdrawal Syndrome When a person who drinks a lot of alcohol stops drinking, he or she may have unpleasant and serious symptoms. These symptoms are called alcohol withdrawal syndrome. This condition may be mild or severe. It can be life-threatening. It can cause:  Shaking that you cannot control (tremor).  Sweating.  Headache.  Feeling fearful, upset, grouchy, or depressed.  Trouble sleeping (insomnia).  Nightmares.  Fast or uneven heartbeats (palpitations).  Alcohol cravings.  Feeling sick to your stomach (nausea).  Throwing up (vomiting).  Being bothered by light and sounds.  Confusion.  Trouble thinking clearly.  Not being hungry (loss of appetite).  Big changes in mood (mood swings). If you have all of the following symptoms at the same time, get help right away:  High blood pressure.  Fast heartbeat.  Trouble breathing.  Seizures.  Seeing, hearing, feeling, smelling, or tasting things that are not there (hallucinations). These symptoms are known as delirium tremens (DTs). They must be treated at the hospital right away. Follow these instructions at home:   Take over-the-counter and prescription medicines only as told by your doctor. This includes vitamins.  Do not drink alcohol.  Do not drive until your doctor says that this is safe for you.  Have someone stay with you or be available in case you need help. This should be someone you trust. This person can help you with your symptoms. He or she can also help you to not drink.  Drink enough fluid to keep your pee (urine) pale yellow.  Think about joining a support group or a treatment program to help you stop drinking.  Keep all follow-up visits as told by your doctor. This is important. Contact a doctor if:  Your symptoms get worse.  You cannot eat or drink without throwing up.  You have a hard time not drinking alcohol.  You cannot stop drinking alcohol. Get help right away  if:  You have fast or uneven heartbeats.  You have chest pain.  You have trouble breathing.  You have a seizure for the first time.  You see, hear, feel, smell, or taste something that is not there.  You get very confused. Summary  When a person who drinks a lot of alcohol stops drinking, he or she may have serious symptoms. This is called alcohol withdrawal syndrome.  Delirium tremens (DTs) is a group of life-threatening symptoms. You should get help right away if you have these symptoms.  Think about joining an alcohol support group or a treatment program. This information is not intended to replace advice given to you by your health care provider. Make sure you discuss any questions you have with your health care provider. Document Released: 09/04/2007 Document Revised: 02/28/2017 Document Reviewed: 11/22/2016 Elsevier Patient Education  2020 [employer]    Binge-Drinking Information, Adult Binge-drinking refers to drinking a large amount of alcohol in a short time. This is usually 5 drinks for men or 4 drinks for women, on one occasion. People who binge-drink do not always have an alcohol problem. However, binge-drinking can raise your risk of becoming dependent on alcohol (having alcohol use disorder). In addition to health problems, such as heart disease, liver disease, or cancer, binge-drinking can also lead to legal, financial, and interpersonal problems. Friends and family may notice signs of binge-drinking or alcohol use disorder before you do. What lifestyle changes can be made?      Avoid drinking too much. If you choose to drink, drink slowly, set a limit  for yourself, and follow that plan. Aim for a limit of 1 drink a day for nonpregnant women and 2 drinks a day for men. One drink equals 12 oz of beer, 5 oz of wine, or 1 oz of hard liquor.  Encourage others around you not to binge-drink.  Become aware of situations and people who trigger your drinking behavior,  and either avoid those or find a new way to deal with them. Find hobbies that you can do instead of drinking, such as exercising or outdoor activities.  Do not drink if: ? You are pregnant or trying to become pregnant. ? You plan to drive a vehicle. ? You plan to use machinery, such as a Surveyor, mining or power tool. ? You have a health condition that alcohol can make worse. ? You take medicines that are affected by alcohol, including prescription pain medicines. ? You are recovering from alcohol use disorder.  Develop skills to manage your moods and emotions so you do not need to drink to cope with them. This may include using stress reduction techniques such as: ? Deep breathing. ? Meditation or yoga. ? Exercise or playing sports. ? Keeping a stress diary. ? Listening to music. Why are these changes important? Binge-drinking can put you at risk for serious health problems, including:  Accidental injuries, such as alcohol poisoning or falls.  Violence, such as sexual assault.  STDs (sexually transmitted diseases).  Developing alcohol use disorder.  Mental health problems, such as anxiety or depression.  Problems with memory or learning.  Liver disease.  High blood pressure.  Heart disease.  Stroke.  Cancer of the liver, colon, esophagus, breast, or mouth. If you binge-drink while pregnant, your baby may also be at risk for health problems, including:  Miscarriage.  Stillbirth.  Fetal alcohol spectrum disorders (FASDs).  Sudden infant death syndrome (SIDS). What can happen if changes are not made? In addition to health problems, binge-drinking can also raise your risk of:  Car accidents.  Problems with relationships and other social situations.  Legal or financial problems.  Unplanned pregnancies. What are the benefits of controlling my drinking? Controlling your drinking or quitting drinking can make you feel better and:  Help you control your  weight.  Make you more likely to get into good physical shape and stay fit.  Improve how your body processes vitamins and minerals.  Improve your health by lowering your blood pressure, cholesterol, triglycerides, and blood sugar (glucose).  Help you think more clearly and make better decisions. Where to find support: You can get support for stopping binge-drinking from:  Your health care provider. He or she may be able to recommend counseling if you drink too much.  The National Drug and Alcohol Treatment Referral Service: 1-800-662-HELP 616 275 1367)  Alcoholics Anonymous, Alcoholics Resource Center: 231-720-2274 Where to find more information: You can find more information about binge-drinking from:  Substance Abuse and Mental Health Services Administration: SkateOasis.com.pt  Alcoholics Resource Center: www.alcoholicsanonymous.com Contact a health care provider if:  You cannot control your drinking, or you think that your drinking might be out of your control.  You have unexpected physical problems that cause you distress, such as accidental injuries.  You need help with the consequences of your drinking. Get help right away if:  You have thoughts about hurting yourself or others. If you ever feel like you may hurt yourself or others, or have thoughts about taking your own life, get help right away. You can go to your nearest emergency  department or call:  Your local emergency services (911 in the [place]  A suicide crisis helpline, such as the [place] at 765 794 1771. This is open 24 hours a day. Summary  Binge-drinking refers to drinking a large amount of alcohol in a short time. This is usually 5 drinks for men or 4 drinks for women, on one occasion.  Binge-drinking can lead to serious health problems, such as heart disease, liver disease, or cancer.  Binge-drinking raises your risk of developing alcohol dependence (alcohol use disorder) and  social and relationship problems.  Talk with your health care provider about your drinking habits. He or she may be able to recommend counseling if you drink too much. This information is not intended to replace advice given to you by your health care provider. Make sure you discuss any questions you have with your health care provider. Document Released: 01/15/2017 Document Revised: 07/08/2018 Document Reviewed: 01/15/2017 Elsevier Patient Education  2020 [employer].

## 2018-12-30 NOTE — Discharge Summary (Signed)
Physician Discharge Summary  Ray Case [id number] DOB: 12-21-1965 DOA: 12/28/2018  PCP: Ray Sax, MD  Admit date: 12/28/2018 Discharge date: 12/30/2018  Admitted From: Home Disposition: Home  Recommendations for Outpatient Follow-up:  1. Follow up with PCP in 1-2 weeks 2. Please obtain CBC/BMP/Mag at follow up 3. Please follow up on the following pending results: None  Home Health: None Equipment/Devices: None  Discharge Condition: Stable CODE STATUS: Full code  Hospital Course: 53 year old male who presented with shortness of breath, chest pain, nausea, vomiting and diarrhea.  He does have significant past medical history for dyslipidemia, tobacco abuse, former smoker, marijuana abuse.  24 hours prior to hospitalization he had chest pain, dyspnea and chills.  On his initial physical examination blood pressure 136/81, pulse rate 98, respirate 21, oxygen saturation 100%.  His lungs are clear to auscultation bilaterally, heart is also present rhythmic, abdomen soft nontender, no lower extremity edema.  Sodium 141, potassium 3.3, chloride 105, bicarb 15, glucose 151, BUN 12, creatinine 0.93, anion gap 21, white count 10.4, hemoglobin 15.5, hematocrit 46.2, platelets 219.  SARS COVID-19 was negative.  Urinalysis negative for infection.  Toxicology positive for tetrahydrocannabinol.  Alcohol level less than 10.  His a chest radiograph was negative for infiltrates.  EKG 95 bpm, normal axis, normal intervals, sinus rhythm with positive PVCs, no ST segment T wave changes, poor with progression.  Patient was admitted to the hospital with a working diagnosis of atypical chest pain, in the setting of acute alcohol withdrawal. Patient was started on CIWA protocol with Ativan that he did not need.  Electrolytes replenished and stabilized.  Remained hemodynamically stable.  Felt well and ready to go home.  Never had history of alcohol withdrawal.  Discharged home to follow-up with  PCP.  He is highly motivated to quit drinking.  He has been provided with resources.   Discharge Diagnoses:  1. Acute alcohol withdrawal with nausea, vomiting and diarrhea: received 3 mg of IV lorazepam the day prior to discharge and remained stable syndrome.  CIWA protocol remained low at 0.  Motivated to quit drinking.  Was provided with resources.  Patient to follow-up with PCP in 1 to 2 weeks.  2. Atypical chest pain. Follow up ekg with no ischemic changes, high sensitive troponin negative. Ruled out acute coronary syndrome.  3. Acute metabolic acidosis/ alcoholic ketoacidosis/ hypokalemia and hypomagnesemia: due to alcohol.  Resolved.  4. Dyslipidemia: continue with rosuvastatin but doubt need without history of diabetes  5. Depression. Continue with venlafaxine.   Discharge Instructions  Discharge Instructions    Call MD for:  difficulty breathing, headache or visual disturbances   Complete by: As directed    Call MD for:  persistant dizziness or light-headedness   Complete by: As directed    Call MD for:  persistant nausea and vomiting   Complete by: As directed    Call MD for:  severe uncontrolled pain   Complete by: As directed    Call MD for:  temperature >100.4   Complete by: As directed    Diet general   Complete by: As directed    Increase activity slowly   Complete by: As directed      Allergies as of 12/30/2018   No Known Allergies     Medication List    TAKE these medications   rosuvastatin 20 MG tablet Commonly known as: CRESTOR TAKE 1 TABLET BY MOUTH EVERY DAY   venlafaxine XR 37.5 MG 24 hr capsule Commonly known  as: EFFEXOR-XR TAKE 1 CAPSULE (37.5 MG TOTAL) BY MOUTH DAILY WITH BREAKFAST.      Follow-up Information    Ray SaxKremer, Ray Alfred, MD. Schedule an appointment as soon as possible for a visit in 2 week(s).   Specialty: Family Medicine Contact information: [address] [place] [address] [phone]            Consultations:  None  Procedures/Studies:  2D Echo: None  Dg Chest Portable 1 View  Result Date: 12/28/2018 CLINICAL DATA:  53 y.o male BIBA from home. Pt was feeling his normal this morning, then started having SHOB with chills, diaphoresis this afternoon. Sats originally 99%, then 76%. Pt went to tripod positioning with EMS. Pt was put on nonrebreather with EMS. Hx TB. EXAM: PORTABLE CHEST 1 VIEW COMPARISON:  None. FINDINGS: The heart size and mediastinal contours are within normal limits. The lungs are clear. No pneumothorax or pleural effusion. The visualized skeletal structures are unremarkable. IMPRESSION: No evidence of active disease. Electronically Signed   By: Ray KluverNancy  Case M.D.   On: 12/28/2018 18:48   Ct Angio Chest/abd/pel For Dissection W And/or Wo Contrast  Result Date: 12/28/2018 CLINICAL DATA:  Chest pain, acute aortic syndrome suspected, hemodynamically stable. EXAM: CT ANGIOGRAPHY CHEST, ABDOMEN AND PELVIS TECHNIQUE: Multidetector CT imaging through the chest, abdomen and pelvis was performed using the standard protocol during bolus administration of intravenous contrast. Multiplanar reconstructed images and MIPs were obtained and reviewed to evaluate the vascular anatomy. CONTRAST:  100mL OMNIPAQUE IOHEXOL 350 MG/ML SOLN COMPARISON:  Chest radiograph 12/28/2018 FINDINGS: CTA CHEST FINDINGS Cardiovascular: Noncontrast CT of the chest reveals no [name] hyperdense mural thickening to suggest intramural hematoma. Postcontrast evaluation of the chest demonstrates slight preferential opacification of pulmonary arteries over contrast bolus of the aorta remains of diagnostic utility. The aortic root is suboptimally assessed given cardiac pulsation artifact. The aorta is normal caliber. No intramural hematoma, dissection flap or other acute luminal abnormality of the aorta is seen. No periaortic stranding or hemorrhage. Normal 3 vessel arch. Proximal great vessels and subclavian arteries  free of acute abnormality. Mild tortuosity of the right brachiocephalic vessels is present. Central pulmonary arteries are well opacified. No central or lobar filling defects are seen. More distal evaluation is limited by respiratory motion artifact. Normal heart size. No pericardial effusion. Mediastinum/Nodes: No enlarged mediastinal or axillary lymph nodes. Thyroid gland, trachea, and esophagus demonstrate no significant findings. Lungs/Pleura: No consolidation, features of edema, pneumothorax, or effusion. No suspicious pulmonary nodules or masses. Evaluation is limited by respiratory motion artifact of the lung parenchyma. Musculoskeletal: No acute or suspicious osseous lesions present within the spine. Multilevel degenerative changes are present in the imaged portions of the spine. Few Schmorl's nodes are seen. Review of the MIP images confirms the above findings. CTA ABDOMEN AND PELVIS FINDINGS VASCULAR Aorta: Normal caliber aorta without aneurysm, dissection, vasculitis or significant stenosis. Celiac: Patent without evidence of aneurysm, dissection, vasculitis or significant stenosis. SMA: Patent without evidence of aneurysm, dissection, vasculitis or significant stenosis. Renals: 2 right renal arteries, single left renal artery. The arteries are patent without evidence of aneurysm, dissection, vasculitis, fibromuscular dysplasia or significant stenosis. IMA: Patent without evidence of aneurysm, dissection, vasculitis or significant stenosis. Inflow: Patent without evidence of aneurysm, dissection, vasculitis or significant stenosis. Veins: No obvious venous abnormality within the limitations of this arterial phase study. Review of the MIP images confirms the above findings. NON-VASCULAR Hepatobiliary: No focal liver abnormality is seen. No gallstones, gallbladder wall thickening, or biliary dilatation.  Pancreas: Unremarkable. No pancreatic ductal dilatation or surrounding inflammatory changes. Spleen:  Normal in size without focal abnormality. Adrenals/Urinary Tract: Adrenal glands are unremarkable. Kidneys are normal, without renal calculi, focal lesion, or hydronephrosis. Mild bladder wall thickening versus underdistention. Stomach/Bowel: Distal esophagus, stomach and duodenal sweep are unremarkable. No bowel wall thickening or dilatation. No evidence of obstruction. A normal appendix is visualized. No colonic dilatation or wall thickening. Lymphatic: No suspicious or enlarged lymph nodes in the included lymphatic chains. Reproductive: The prostate and seminal vesicles are unremarkable. Other: No abdominopelvic free fluid or free gas. No bowel containing hernias. Musculoskeletal: Multilevel degenerative changes are present in the imaged portions of the spine. There are bilateral L4 pars defects with grade 2 anterolisthesis of L4 on L5 and extensive sclerotic endplate changes. Question remote fracture versus degenerative change of the L3 spinous process. 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 Degenerative changes are present in both hips. No acute osseous abnormality or suspicious osseous lesion. Review of the MIP images confirms the above findings. IMPRESSION: 1. Motion degraded exam secondary to both patient and respiratory motion artifact. 2. No evidence of acute aortic syndrome. No acute intrathoracic process. 3. Mild circumferential bladder wall thickening versus underdistention. Correlate with urinary symptoms and consider urinalysis. 4. Multilevel degenerative changes in the spine. 5. 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 Extensive associated sclerotic endplate changes. 6. 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 Electronically Signed   By: Kreg Shropshire M.D.   On: 12/28/2018 23:01      Subjective: No major events overnight of this morning.  No complaint this morning.  Denies chest pain, dyspnea, nausea, vomiting, abdominal pain or alcohol  withdrawal symptoms.  Feels well and ready to go home.  Very motivated to quit drinking.   Discharge Exam: Vitals:   12/30/18 0038 12/30/18 0530  BP: (!) 134/91 127/84  Pulse: 71 72  Resp: 17 16  Temp: 98.6 F (37 C) 98.7 F (37.1 C)  SpO2: 100% 100%    GENERAL: No acute distress.  Appears well.  HEENT: MMM.  Vision and hearing grossly intact.  NECK: Supple.  No JVD.  LUNGS:  No IWOB. Good air movement bilaterally. HEART:  RRR. Heart sounds normal.  ABD: Bowel sounds present. Soft. Non tender.  MSK/EXT:  Moves all extremities. No apparent deformity. No edema bilaterally. SKIN: no apparent skin lesion or wound NEURO: Awake, alert and oriented appropriately.  No gross deficit.  PSYCH: Calm. Normal affect.     The results of significant diagnostics from this hospitalization (including imaging, microbiology, ancillary and laboratory) are listed below for reference.     Microbiology: Recent Results (from the past 240 hour(s))  SARS Coronavirus 2 [facility] order, Performed in [facility] hospital lab) Nasopharyngeal Nasopharyngeal Swab     Status: None   Collection Time: 12/28/18 10:15 PM   Specimen: Nasopharyngeal Swab  Result Value Ref Range Status   SARS Coronavirus 2 NEGATIVE NEGATIVE Final    Comment: (NOTE) If result is NEGATIVE SARS-CoV-2 target nucleic acids are NOT DETECTED. The SARS-CoV-2 RNA is generally detectable in upper and lower  respiratory specimens during the acute phase of infection. The lowest  concentration of SARS-CoV-2 viral copies this assay can detect is 250  copies / mL. A negative result does not preclude SARS-CoV-2 infection  and should not be used as the sole basis for treatment or other  patient management decisions.  A negative result may occur with  improper  specimen collection / handling, submission of specimen other  than nasopharyngeal swab, presence of viral mutation(s) within the  areas targeted by this assay, and inadequate number of  viral copies  (<250 copies / mL). A negative result must be combined with clinical  observations, patient history, and epidemiological information. If result is POSITIVE SARS-CoV-2 target nucleic acids are DETECTED. The SARS-CoV-2 RNA is generally detectable in upper and lower  respiratory specimens dur ing the acute phase of infection.  Positive  results are indicative of active infection with SARS-CoV-2.  Clinical  correlation with patient history and other diagnostic information is  necessary to determine patient infection status.  Positive results do  not rule out bacterial infection or co-infection with other viruses. If result is PRESUMPTIVE POSTIVE SARS-CoV-2 nucleic acids MAY BE PRESENT.   A presumptive positive result was obtained on the submitted specimen  and confirmed on repeat testing.  While 2019 novel coronavirus  (SARS-CoV-2) nucleic acids may be present in the submitted sample  additional confirmatory testing may be necessary for epidemiological  and / or clinical management purposes  to differentiate between  SARS-CoV-2 and other Sarbecovirus currently known to infect humans.  If clinically indicated additional testing with an alternate test  methodology [phone]) is advised. The SARS-CoV-2 RNA is generally  detectable in upper and lower respiratory sp ecimens during the acute  phase of infection. The expected result is Negative. Fact Sheet for Patients:  StrictlyIdeas.no Fact Sheet for Healthcare Providers: BankingDealers.co.za This test is not yet approved or cleared by the [place] FDA and has been authorized for detection and/or diagnosis of SARS-CoV-2 by FDA under an Emergency Use Authorization (EUA).  This EUA will remain in effect (meaning this test can be used) for the duration of the COVID-19 declaration under Section 564(b)(1) of the Act, 21 U.S.C. section 360bbb-3(b)(1), unless the authorization is  terminated or revoked sooner. Performed at [facility], [place] [address] [place], [place] [address]      Labs: BNP (last 3 results) No results for input(s): BNP in the last 8760 hours. Basic Metabolic Panel: Recent Labs  Lab 12/28/18 1756 12/28/18 2116 12/29/18 0852 12/30/18 0559  NA 141  --  137 138  K 3.3*  --  3.7 4.4  CL 105  --  108 110  CO2 15*  --  20* 20*  GLUCOSE 151*  --  113* 97  BUN 12  --  11 9  CREATININE 0.93  --  0.81 0.83  CALCIUM 9.7  --  8.0* 8.2*  MG  --  1.6*  --  1.9  PHOS  --   --   --  2.5   Liver Function Tests: Recent Labs  Lab 12/28/18 1756  AST 27  ALT 25  ALKPHOS 61  BILITOT 1.0  PROT 8.2*  ALBUMIN 4.9   Recent Labs  Lab 12/28/18 1756  LIPASE 22   No results for input(s): AMMONIA in the last 168 hours. CBC: Recent Labs  Lab 12/28/18 1756 12/29/18 0852  WBC 10.4 11.1*  HGB 15.5 13.2  HCT 46.2 40.1  MCV 89.5 91.3  PLT 219 171   Cardiac Enzymes: Recent Labs  Lab 12/28/18 1825  CKTOTAL 125   BNP: Invalid input(s): POCBNP CBG: No results for input(s): GLUCAP in the last 168 hours. D-Dimer No results for input(s): DDIMER in the last 72 hours. Hgb A1c Recent Labs    12/29/18 0852  HGBA1C 5.8*   Lipid Profile Recent Labs  12/29/18 0852  CHOL 154  HDL 62  LDLCALC 81  TRIG 53  CHOLHDL 2.5   Thyroid function studies No results for input(s): TSH, T4TOTAL, T3FREE, THYROIDAB in the last 72 hours.  Invalid input(s): FREET3 Anemia work up No results for input(s): VITAMINB12, FOLATE, FERRITIN, TIBC, IRON, RETICCTPCT in the last 72 hours. Urinalysis    Component Value Date/Time   COLORURINE YELLOW 12/28/2018 1756   APPEARANCEUR CLEAR 12/28/2018 1756   LABSPEC 1.015 12/28/2018 1756   PHURINE 8.0 12/28/2018 1756   GLUCOSEU 50 (A) 12/28/2018 1756   GLUCOSEU NEGATIVE 09/02/2017 0833   HGBUR NEGATIVE 12/28/2018 1756   BILIRUBINUR NEGATIVE 12/28/2018 1756   BILIRUBINUR neg 07/04/2014 1220    KETONESUR 80 (A) 12/28/2018 1756   PROTEINUR NEGATIVE 12/28/2018 1756   UROBILINOGEN 0.2 09/02/2017 0833   NITRITE NEGATIVE 12/28/2018 1756   LEUKOCYTESUR NEGATIVE 12/28/2018 1756   Sepsis Labs Invalid input(s): PROCALCITONIN,  WBC,  LACTICIDVEN   Time coordinating discharge: 25 minutes  SIGNED:  Almon Hercules, MD  [facility] 12/30/2018, 10:31 AM  If 7PM-7AM, please contact night-coverage [email or web address] Password [email or web address]

## 2019-01-01 ENCOUNTER — Encounter: Payer: Self-pay | Admitting: Physical Therapy

## 2019-01-01 ENCOUNTER — Ambulatory Visit: Payer: BC Managed Care – PPO | Attending: Family Medicine | Admitting: Physical Therapy

## 2019-01-01 DIAGNOSIS — M545 Low back pain: Secondary | ICD-10-CM | POA: Insufficient documentation

## 2019-01-01 DIAGNOSIS — G8929 Other chronic pain: Secondary | ICD-10-CM | POA: Insufficient documentation

## 2019-01-01 NOTE — Therapy (Signed)
[facility] [address] [place], [place], [address] Phone: [phone]   Fax:  [phone] I connected with  Ray Case on 01/01/19 by a video enabled telemedicine application and verified that I am speaking with the correct person using two identifiers.   I discussed the limitations of evaluation and management by telemedicine. The patient expressed understanding and agreed to proceed.  Physical Therapy Evaluation  Patient Details  Name: Ray Case MRN: [id number] Date of Birth: [birth year] Referring Provider (PT): Terrilee FilesSmith, Zach   Encounter Date: 01/01/2019  PT End of Session - 01/01/19 1125    Visit Number  1    Date for PT Re-Evaluation  03/03/19    PT Start Time  0800    PT Stop Time  0840    PT Time Calculation (min)  40 min    Activity Tolerance  Patient tolerated treatment well    Behavior During Therapy  [facility] for tasks assessed/performed       Past Medical History:  Diagnosis Date  . Achilles tendon rupture 2014   Left  . Complication of anesthesia    Pt had previouds experience of feeling pain after local anesthesia  . Headache(784.0)   . Hyperlipidemia   . Tuberculosis    Pt exposed and medicated  1990's    Past Surgical History:  Procedure Laterality Date  . ACHILLES TENDON SURGERY Left 02/24/2013   Procedure: LEFT ACHILLES TENDON REPAIR;  Surgeon: Cheral AlmasNaiping Sovereign Ramiro Xu, MD;  [facility];  Service: Orthopedics;  Laterality: Left;  . FRACTURE SURGERY Right     arm, metal pin    There were no vitals filed for this visit.   Subjective Assessment - 01/01/19 0839    Subjective  Patient reports that he has had some low back pain for 10+ years, he was a Forensic psychologistprofessional dancer.  MRI shows Severe impingement L4-5 with grade II anterolisthesis, and Moderate impingement at L5-S1    Pertinent History  achilles surgery    Limitations  Sitting;Standing;Walking    Patient Stated Goals  would like to have less  pain and would like to walk more    Currently in Pain?  Yes    Pain Score  2     Pain Location  Back    Pain Orientation  Right;Lower    Pain Descriptors / Indicators  Sore;Tightness    Pain Type  Chronic pain    Pain Radiating Towards  some pain and tightness in the right buttock and the right ITB    Pain Onset  More than a month ago    Pain Frequency  Constant    Aggravating Factors   sitting, walking pain can be up to 8/10    Pain Relieving Factors  yoga and ice help pain can be a 2/10    Effect of Pain on Daily Activities  Reports difficulty sitting and walking         [facility] PT Assessment - 01/01/19 0001      Assessment   Medical Diagnosis  LBP    Referring Provider (PT)  Terrilee FilesSmith, Zach    Onset Date/Surgical Date  12/02/18    Prior Therapy  no      Precautions   Precautions  None      Balance Screen   Has the patient fallen in the past 6 months  No    Has the patient had a decrease in activity level because of a fear of falling?  No    Is the patient reluctant to leave their home because of a fear of falling?   No      Home Environment   Additional Comments  does some housework      Prior Function   Level of Independence  Independent    Vocation  Full time employment    Vocation Requirements  professor, sitting, some dance    Leisure  yoga      Posture/Postural Control   Posture Comments  some increased lordosis      ROM / Strength   AROM / PROM / Strength  AROM      AROM   Overall AROM Comments  lumbar ROM WNL's for flexion and side bneding, decreased 50% for extension with some low back pain      Flexibility   Soft Tissue Assessment /Muscle Length  yes    Hamstrings  good    Quadriceps  mild tightness    ITB  tight    Piriformis  tight                Objective measurements completed on examination: See above findings.                PT Short Term Goals - 01/01/19 1144      PT SHORT TERM GOAL #1   Title  independent with  intiial HEP    Time  2    Period  Weeks    Status  New        PT Long Term Goals - 01/01/19 1144      PT LONG TERM GOAL #1   Title  understand posture and body mechanics    Time  8    Period  Weeks    Status  New      PT LONG TERM GOAL #2   Title  report able to walk 1 mile with 50% less pain    Time  8    Period  Weeks    Status  New      PT LONG TERM GOAL #3   Title  report less pain with sitting 50%    Period  Weeks    Status  New             Plan - 01/01/19 1126    Clinical Impression Statement  Patient reports 10+ years of LBP, MRI showed impingment and DDD, 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  He reports difficulty sitting and walking, reports that he feels tight in the LE's.  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  C/O tightness of the LE's    Clinical Decision Making  Low    Rehab Potential  Good    PT Frequency  1x / week    PT Duration  8 weeks    PT Treatment/Interventions  ADLs/Self Care Home Management;Electrical Stimulation;Moist Heat;Traction;Functional mobility training;Therapeutic activities;Therapeutic exercise;Neuromuscular re-education;Manual techniques;Dry needling;Patient/family education    PT Next Visit Plan  with the diagnosis I feel exercises to open the space would be best with tLE felxibility to decrease stress, then work on core stability    Consulted and Agree with Plan of Care  Patient       Patient will benefit from skilled therapeutic intervention in order to improve the following deficits and impairments:  Difficulty walking, Impaired flexibility, Postural dysfunction, Pain, Improper body mechanics, Increased muscle spasms, Decreased range of motion  Visit Diagnosis: Chronic bilateral low back pain without sciatica - Plan:  PT plan of care cert/re-cert     Problem List Patient Active Problem List   Diagnosis Date Noted  . Lactic acidosis 12/29/2018  . Alcohol abuse 12/29/2018  . Alcohol withdrawal ([place]) 12/29/2018  .  Nausea, vomiting and diarrhea 12/28/2018  . SOB (shortness of breath) 12/28/2018  . Chest pain 12/28/2018  . Hypokalemia 12/28/2018  . Hypomagnesemia 12/28/2018  . Hamstring tendinitis of left thigh 07/01/2018  . Nonallopathic lesion of lumbosacral region 12/03/2017  . Nonallopathic lesion of rib cage 12/03/2017  . Elevated LDL cholesterol level 06/13/2017  . Cervical radiculopathy 06/13/2017  . Right elbow pain 06/13/2017  . Tibialis posterior tendinitis, right 11/27/2016  . Hip abductor tendinitis 11/27/2016  . Encounter for health maintenance examination with abnormal findings 07/13/2014  . Chronic neck pain 07/12/2014  . Nonallopathic lesion of cervical region 07/12/2014  . Nonallopathic lesion of thoracic region 07/12/2014  . Nonallopathic lesion of sacral region 07/12/2014  . Chronic back pain 06/15/2014  . Insomnia 05/24/2013  . Achilles tendon rupture 02/24/2013  . LOW BACK PAIN, CHRONIC 11/03/2008  . HERPES, GENITAL NOS 01/16/2007  . ANXIETY 01/16/2007  . DEPRESSION 01/16/2007  . ALLERGIC RHINITIS 01/16/2007    Sumner Boast., PT 01/01/2019, 11:47 AM  [place] [address] [place], [place], [address] Phone: [phone]   Fax:  [phone]  Name: Ray Case MRN: [id number] Date of Birth: March 31, 1966

## 2019-01-06 ENCOUNTER — Telehealth: Payer: Self-pay | Admitting: Family Medicine

## 2019-01-06 NOTE — Telephone Encounter (Signed)
[employer] calling [place]/ Nurse case mgr States that this pt is a high risk to readmit was in [place] with Hypokalemia and she felt would be helpful to leave her contact [place] at [place] at [phone]

## 2019-01-06 NOTE — Telephone Encounter (Signed)
fyi - pt coming next week for hospital f/u.

## 2019-01-13 ENCOUNTER — Inpatient Hospital Stay: Payer: BC Managed Care – PPO | Admitting: Family Medicine

## 2019-01-14 ENCOUNTER — Other Ambulatory Visit: Payer: Self-pay

## 2019-01-14 ENCOUNTER — Ambulatory Visit: Payer: BC Managed Care – PPO | Admitting: Family Medicine

## 2019-01-14 ENCOUNTER — Encounter: Payer: Self-pay | Admitting: Family Medicine

## 2019-01-14 VITALS — BP 126/80 | HR 90 | Temp 97.3°F | Ht 71.0 in | Wt 204.0 lb

## 2019-01-14 DIAGNOSIS — Z09 Encounter for follow-up examination after completed treatment for conditions other than malignant neoplasm: Secondary | ICD-10-CM | POA: Diagnosis not present

## 2019-01-14 DIAGNOSIS — Z7289 Other problems related to lifestyle: Secondary | ICD-10-CM | POA: Diagnosis not present

## 2019-01-14 DIAGNOSIS — E7800 Pure hypercholesterolemia, unspecified: Secondary | ICD-10-CM | POA: Diagnosis not present

## 2019-01-14 DIAGNOSIS — Z789 Other specified health status: Secondary | ICD-10-CM

## 2019-01-14 DIAGNOSIS — Z23 Encounter for immunization: Secondary | ICD-10-CM | POA: Diagnosis not present

## 2019-01-14 LAB — URINALYSIS, ROUTINE W REFLEX MICROSCOPIC
Bilirubin Urine: NEGATIVE
Hgb urine dipstick: NEGATIVE
Ketones, ur: NEGATIVE
Leukocytes,Ua: NEGATIVE
Nitrite: NEGATIVE
RBC / HPF: NONE SEEN (ref 0–?)
Specific Gravity, Urine: 1.025 (ref 1.000–1.030)
Total Protein, Urine: NEGATIVE
Urine Glucose: NEGATIVE
Urobilinogen, UA: 0.2 (ref 0.0–1.0)
WBC, UA: NONE SEEN (ref 0–?)
pH: 5.5 (ref 5.0–8.0)

## 2019-01-14 LAB — COMPREHENSIVE METABOLIC PANEL
ALT: 18 U/L (ref 0–53)
AST: 14 U/L (ref 0–37)
Albumin: 4.3 g/dL (ref 3.5–5.2)
Alkaline Phosphatase: 62 U/L (ref 39–117)
BUN: 14 mg/dL (ref 6–23)
CO2: 29 mEq/L (ref 19–32)
Calcium: 9.3 mg/dL (ref 8.4–10.5)
Chloride: 104 mEq/L (ref 96–112)
Creatinine, Ser: 1.11 mg/dL (ref 0.40–1.50)
GFR: 83.4 mL/min (ref >60.00)
Glucose, Bld: 94 mg/dL (ref 70–99)
Potassium: 4.3 mEq/L (ref 3.5–5.1)
Sodium: 139 mEq/L (ref 135–145)
Total Bilirubin: 0.3 mg/dL (ref 0.2–1.2)
Total Protein: 7.1 g/dL (ref 6.0–8.3)

## 2019-01-14 LAB — CBC
HCT: 41.5 % (ref 39.0–52.0)
Hemoglobin: 13.5 g/dL (ref 13.0–17.0)
MCHC: 32.7 g/dL (ref 30.0–36.0)
MCV: 91 fl (ref 78.0–100.0)
Platelets: 253 10*3/uL (ref 150.0–400.0)
RBC: 4.56 Mil/uL (ref 4.22–5.81)
RDW: 13.6 % (ref 11.5–15.5)
WBC: 7.1 10*3/uL (ref 4.0–10.5)

## 2019-01-14 LAB — LDL CHOLESTEROL, DIRECT: Direct LDL: 79 mg/dL

## 2019-01-14 LAB — MAGNESIUM: Magnesium: 1.9 mg/dL (ref 1.5–2.5)

## 2019-01-14 NOTE — Progress Notes (Signed)
Established Patient Office Visit  Subjective:  Patient ID: Ray Case, male    DOB: [birth year]  Age: 53 y.o. MRN: [id number]  CC:  Chief Complaint  Patient presents with  . Hospitalization Follow-up    HPI Ray Case presents for hospital discharge follow-up.  He had been admitted for acute nausea and vomiting with nonspecific EKG changes.  Admitting physician felt as though alcohol withdrawal was involved with the presentation.  Patient admitted that he had been drinking more than he should for an extended.  Of time.  Alcoholism does run in his family.  Patient feels as though some of his friends had noted his use of alcohol.  Patient was somewhat concerned himself.  Patient denied irritation with comments about his drinking or taking an eye opener.  Denied drinking more than he had tended in any given situation but always felt as though he was ready to consume alcohol when the opportunity presented itself.  He denied avoiding social activities that did not involve alcohol.  Patient has not had any alcohol or smoked marijuana since the 27th of last month.  He would like to go for counseling about this matter.  Continues to take his Crestor.  Past Medical History:  Diagnosis Date  . Achilles tendon rupture 2014   Left  . Complication of anesthesia    Pt had previouds experience of feeling pain after local anesthesia  . Headache(784.0)   . Hyperlipidemia   . Tuberculosis    Pt exposed and medicated  1990's    Past Surgical History:  Procedure Laterality Date  . ACHILLES TENDON SURGERY Left 02/24/2013   Procedure: LEFT ACHILLES TENDON REPAIR;  Surgeon: Cheral Almas, MD;  Location: [facility] OR;  Service: Orthopedics;  Laterality: Left;  . FRACTURE SURGERY Right     arm, metal pin    Family History  Problem Relation Age of Onset  . Cancer - Lung Mother        Mother died of lung cancer at age 5 (smoker)  . Hypertension Father        Father died in his 11s secondary to aortic  aneurysm. He has history of stroke and was a smoker  . AAA (abdominal aortic aneurysm) Father   . Diverticulosis Sister   . Schizophrenia Cousin        and nephew  . Obesity Sister        Sister died at age 29 secondary to complications of morbid obesity    Social History   Socioeconomic History  . Marital status: Single    Spouse name: Not on file  . Number of children: 0  . Years of education: Not on file  . Highest education level: Not on file  Occupational History  . Occupation: professor    Employer: [employer]  Social Needs  . Financial resource strain: Not on file  . Food insecurity    Worry: Not on file    Inability: Not on file  . Transportation needs    Medical: Not on file    Non-medical: Not on file  Tobacco Use  . Smoking status: Former Smoker    Packs/day: 0.25    Years: 8.00    Pack years: 2.00    Types: Cigarettes    Quit date: 12/27/2013    Years since quitting: 5.0  . Smokeless tobacco: Never Used  . Tobacco comment: pt goes in and out trying to quit  Substance and Sexual Activity  . Alcohol use: Yes  Alcohol/week: 4.0 standard drinks    Types: 2 Glasses of wine, 2 Shots of liquor per week    Comment: Prior to his Achilles tendon surgery patient was drinking 2-3 alcoholic beverages per day. He has discontinued alcohol use  . Drug use: Yes    Types: Marijuana  . Sexual activity: Not on file  Lifestyle  . Physical activity    Days per week: Not on file    Minutes per session: Not on file  . Stress: Not on file  Relationships  . Social Musicianconnections    Talks on phone: Not on file    Gets together: Not on file    Attends religious service: Not on file    Active member of club or organization: Not on file    Attends meetings of clubs or organizations: Not on file    Relationship status: Not on file  . Intimate partner violence    Fear of current or ex partner: Not on file    Emotionally abused: Not on file    Physically abused: Not on file     Forced sexual activity: Not on file  Other Topics Concern  . 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   One caffeinated beverage daily    Outpatient Medications Prior to Visit  Medication Sig Dispense Refill  . rosuvastatin (CRESTOR) 20 MG tablet TAKE 1 TABLET BY MOUTH EVERY DAY (Patient taking differently: Take 20 mg by mouth daily. ) 90 tablet 0  . venlafaxine XR (EFFEXOR-XR) 37.5 MG 24 hr capsule TAKE 1 CAPSULE (37.5 MG TOTAL) BY MOUTH DAILY WITH BREAKFAST. 90 capsule 1   No facility-administered medications prior to visit.     No Known Allergies  ROS Review of Systems  Constitutional: Negative.   HENT: Negative.   Respiratory: Negative.   Cardiovascular: Negative.   Gastrointestinal: Negative.   Genitourinary: Negative.   Musculoskeletal: Negative.   Neurological: Negative.   Hematological: Negative.   Psychiatric/Behavioral: Negative.       Objective:    Physical Exam  Constitutional: He is oriented to person, place, and time. He appears well-developed and well-nourished. No distress.  HENT:  Head: Normocephalic and atraumatic.  Right Ear: External ear normal.  Left Ear: External ear normal.  Eyes: Pupils are equal, round, and reactive to light. Conjunctivae are normal. Right eye exhibits no discharge. Left eye exhibits no discharge. No scleral icterus.  Neck: Neck supple. No JVD present. No tracheal deviation present.  Cardiovascular: Normal rate, regular rhythm and normal heart sounds.  Pulmonary/Chest: Effort normal and breath sounds normal. No stridor.  Neurological: He is alert and oriented to person, place, and time.  Skin: Skin is warm and dry. He is not diaphoretic.  Psychiatric: He has a normal mood and affect. His behavior is normal.    BP 126/80   Pulse 90   Temp (!) 97.3 F (36.3 C) (Temporal)   Ht 5\' 11"  (1.803 m)   Wt 204 lb (92.5 kg)   SpO2 97%   BMI 28.45 kg/m  Wt Readings from Last 3 Encounters:   01/14/19 204 lb (92.5 kg)  12/28/18 200 lb (90.7 kg)  01/26/18 206 lb (93.4 kg)   BP Readings from Last 3 Encounters:  01/14/19 126/80  12/30/18 128/82  01/26/18 100/70   Guideline developer:  UpToDate (see UpToDate for funding source) Date Released: June 2014  Health Maintenance Due  Topic Date Due  . TETANUS/TDAP  08/19/1983    There  are no preventive care reminders to display for this patient.  Lab Results  Component Value Date   TSH 0.74 09/02/2017   Lab Results  Component Value Date   WBC 7.1 01/14/2019   HGB 13.5 01/14/2019   HCT 41.5 01/14/2019   MCV 91.0 01/14/2019   PLT 253.0 01/14/2019   Lab Results  Component Value Date   NA 139 01/14/2019   K 4.3 01/14/2019   CO2 29 01/14/2019   GLUCOSE 94 01/14/2019   BUN 14 01/14/2019   CREATININE 1.11 01/14/2019   BILITOT 0.3 01/14/2019   ALKPHOS 62 01/14/2019   AST 14 01/14/2019   ALT 18 01/14/2019   PROT 7.1 01/14/2019   ALBUMIN 4.3 01/14/2019   CALCIUM 9.3 01/14/2019   ANIONGAP 8 12/30/2018   GFR 83.40 01/14/2019   Lab Results  Component Value Date   CHOL 154 12/29/2018   Lab Results  Component Value Date   HDL 62 12/29/2018   Lab Results  Component Value Date   LDLCALC 81 12/29/2018   Lab Results  Component Value Date   TRIG 53 12/29/2018   Lab Results  Component Value Date   CHOLHDL 2.5 12/29/2018   Lab Results  Component Value Date   HGBA1C 5.8 (H) 12/29/2018      Assessment & Plan:   Problem List Items Addressed This Visit      Other   Elevated LDL cholesterol level   Relevant Orders   Comprehensive metabolic panel (Completed)   LDL cholesterol, direct (Completed)    Other Visit Diagnoses    Alcohol use    -  Primary   Relevant Orders   CBC (Completed)   Comprehensive metabolic panel (Completed)   Urinalysis, Routine w reflex microscopic (Completed)   Ambulatory referral to Psychiatry   Hospital discharge follow-up       Relevant Orders   CBC (Completed)   Magnesium  (Completed)   Need for influenza vaccination       Relevant Orders   Flu Vaccine QUAD 36+ mos IM (Completed)      No orders of the defined types were placed in this encounter.   Follow-up: Return in about 3 months (around 04/16/2019).   Patient was given information on alcohol use disorder.  Fortunately he appears to be highly motivated to make any needed changes.

## 2019-01-14 NOTE — Patient Instructions (Signed)

## 2019-01-21 ENCOUNTER — Ambulatory Visit (INDEPENDENT_AMBULATORY_CARE_PROVIDER_SITE_OTHER): Payer: BC Managed Care – PPO | Admitting: Licensed Clinical Social Worker

## 2019-01-21 ENCOUNTER — Encounter (HOSPITAL_COMMUNITY): Payer: Self-pay | Admitting: Licensed Clinical Social Worker

## 2019-01-21 ENCOUNTER — Other Ambulatory Visit: Payer: Self-pay

## 2019-01-21 DIAGNOSIS — F1020 Alcohol dependence, uncomplicated: Secondary | ICD-10-CM

## 2019-01-21 DIAGNOSIS — F411 Generalized anxiety disorder: Secondary | ICD-10-CM | POA: Diagnosis not present
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
## 2019-01-25 ENCOUNTER — Encounter (HOSPITAL_COMMUNITY): Payer: Self-pay | Admitting: Licensed Clinical Social Worker

## 2019-01-25 ENCOUNTER — Ambulatory Visit (INDEPENDENT_AMBULATORY_CARE_PROVIDER_SITE_OTHER): Payer: BC Managed Care – PPO | Admitting: Licensed Clinical Social Worker

## 2019-01-25 ENCOUNTER — Other Ambulatory Visit: Payer: Self-pay

## 2019-01-25 DIAGNOSIS — F411 Generalized anxiety disorder: Secondary | ICD-10-CM | POA: Diagnosis not present

## 2019-01-25 DIAGNOSIS — F1020 Alcohol dependence, uncomplicated: Secondary | ICD-10-CM | POA: Diagnosis not present

## 2019-01-25 NOTE — Progress Notes (Signed)
Virtual Visit via Video Note  I connected with Woodson Seneca on 01/25/19 at  2:00 PM EDT by a video enabled telemedicine application and verified that I am speaking with the correct person using two identifiers.   I discussed the limitations of evaluation and management by telemedicine and the availability of in person appointments. The patient expressed understanding and agreed to proceed.  History of Present Illness: Patient is self referred after ending up at the ER because of alcohol use. He discussed With his PCP requesting therapy.   Observations/Objective: Patient presented for his initial therapy webex appointment. Asisted patient with identifying goals for therapy. Patient verbalized his acceptance of the treatment plan. Spent a considerable amount of the session building a trusting, therapeutic relationship. Discussed patients' abstinence and working a program of recovery. Gave patient [email or web address] to look for zoom AA meetings.  PLAN: Family tree, path  leading to his problematic drinking Zoom AA meeting pscychiatarist?   Assessment and Plan: Counselor will continue to meet with patient to address treatment plan goals. Patient will continue to follow recommendations of provider and implement skills learned in session.     Follow Up Instructions: I will send patient a webex request prior to session. II discussed the assessment and treatment plan with the patient. The patient was provided an opportunity to ask questions and all were answered. The patient agreed with the plan and demonstrated an understanding of the instructions.   The patient was advised to call back or seek an in-person evaluation if the symptoms worsen or if the condition fails to improve as anticipated.  I provided 60 minutes of non-face-to-face time during this encounter.   Alera Quevedo S, LCAS

## 2019-01-28 ENCOUNTER — Telehealth (HOSPITAL_COMMUNITY): Payer: Self-pay | Admitting: Psychiatry

## 2019-01-28 NOTE — Telephone Encounter (Signed)
D:  Nita Sells (CMA) referred pt to CD-IOP.  A:  Placed call to orient and provide pt with an orientation/CCA appt; but there was no answer.  Left vm for pt to call case manager back if he's interested.  Inform Jessica.

## 2019-02-04 ENCOUNTER — Ambulatory Visit (INDEPENDENT_AMBULATORY_CARE_PROVIDER_SITE_OTHER): Payer: BC Managed Care – PPO | Admitting: Licensed Clinical Social Worker

## 2019-02-04 ENCOUNTER — Other Ambulatory Visit: Payer: Self-pay

## 2019-02-04 ENCOUNTER — Encounter (HOSPITAL_COMMUNITY): Payer: Self-pay | Admitting: Licensed Clinical Social Worker

## 2019-02-04 DIAGNOSIS — F1020 Alcohol dependence, uncomplicated: Secondary | ICD-10-CM

## 2019-02-04 DIAGNOSIS — F411 Generalized anxiety disorder: Secondary | ICD-10-CM | POA: Diagnosis not present

## 2019-02-04 NOTE — Progress Notes (Signed)
Virtual Visit via Video Note  I connected with Ray Case on 02/04/19 at  9:00 AM EST by a video enabled telemedicine application and verified that I am speaking with the correct person using two identifiers.    I discussed the limitations of evaluation and management by telemedicine and the availability of in person appointments. The patient expressed understanding and agreed to proceed.  History of Present Illness: Patient is self referred after ending up at the ER because of alcohol use. He discussed with his PCP requesting therapy.   Observations/Objective: Patient presented for his therapy webex appointment. Patient discussed his psychiatric symptoms and current life events. Discussed patients' abstinence. Patient reports he went on [place] http://www.wall.info/ to find meetings but didn't attend any. Coached patient on abstinence vs recovery. Patient is struggling with "letting go" of an obsession from 24 years ago. (dream dance job), which is causing him anxiety. Coached patient on the purpose of mindfulness skills.    Gave patient [email or web address] to look for zoom AA meetings.  PLAN: letting go, relationship circles, mindfulness   Assessment and Plan: Counselor will continue to meet with patient to address treatment plan goals. Patient will continue to follow recommendations of provider and implement skills learned in session.     Follow Up Instructions: I will send patient a webex request prior to session. II discussed the assessment and treatment plan with the patient. The patient was provided an opportunity to ask questions and all were answered. The patient agreed with the plan and demonstrated an understanding of the instructions.   The patient was advised to call back or seek an in-person evaluation if the symptoms worsen or if the condition fails to improve as anticipated.  I provided 60 minutes of non-face-to-face time during this encounter.   Myla Mauriello S, LCAS

## 2019-02-11 ENCOUNTER — Encounter (HOSPITAL_COMMUNITY): Payer: Self-pay | Admitting: Licensed Clinical Social Worker

## 2019-02-11 ENCOUNTER — Ambulatory Visit (INDEPENDENT_AMBULATORY_CARE_PROVIDER_SITE_OTHER): Payer: BC Managed Care – PPO | Admitting: Licensed Clinical Social Worker

## 2019-02-11 ENCOUNTER — Other Ambulatory Visit: Payer: Self-pay

## 2019-02-11 ENCOUNTER — Encounter: Payer: Self-pay | Admitting: *Deleted

## 2019-02-11 DIAGNOSIS — F411 Generalized anxiety disorder: Secondary | ICD-10-CM | POA: Diagnosis not present

## 2019-02-11 DIAGNOSIS — F1020 Alcohol dependence, uncomplicated: Secondary | ICD-10-CM

## 2019-02-11 NOTE — Progress Notes (Signed)
Virtual Visit via Video Note  I connected with Ray Case on 02/11/19 at  9:00 AM EST by a video enabled telemedicine application and verified that I am speaking with the correct person using two identifiers.    I discussed the limitations of evaluation and management by telemedicine and the availability of in person appointments. The patient expressed understanding and agreed to proceed.  History of Present Illness: Patient is self referred after ending up at the ER because of alcohol use. He discussed with his PCP requesting therapy.   Observations/Objective: Patient presented for his therapy webex appointment. Patient discussed his psychiatric symptoms and current life events. Patient reports his moods have been stable. Patient inquired about naltrexone. Provided education on the medication. Patient discussed his abstinence and lack of cravings. Provided education on addiction.  PLAN: letting go, relationship circles, mindfulness   Assessment and Plan: Counselor will continue to meet with patient to address treatment plan goals. Patient will continue to follow recommendations of provider and implement skills learned in session.     Follow Up Instructions: I will send patient a webex request prior to session. II discussed the assessment and treatment plan with the patient. The patient was provided an opportunity to ask questions and all were answered. The patient agreed with the plan and demonstrated an understanding of the instructions.   The patient was advised to call back or seek an in-person evaluation if the symptoms worsen or if the condition fails to improve as anticipated.  I provided 60 minutes of non-face-to-face time during this encounter.   MACKENZIE,LISBETH S, LCAS

## 2019-02-12 ENCOUNTER — Other Ambulatory Visit: Payer: Self-pay

## 2019-02-12 ENCOUNTER — Encounter: Payer: Self-pay | Admitting: Family Medicine

## 2019-02-12 ENCOUNTER — Ambulatory Visit (INDEPENDENT_AMBULATORY_CARE_PROVIDER_SITE_OTHER): Payer: BC Managed Care – PPO | Admitting: Family Medicine

## 2019-02-12 ENCOUNTER — Ambulatory Visit: Payer: BC Managed Care – PPO | Admitting: Family Medicine

## 2019-02-12 VITALS — Ht 71.0 in

## 2019-02-12 DIAGNOSIS — Z7289 Other problems related to lifestyle: Secondary | ICD-10-CM

## 2019-02-12 DIAGNOSIS — Z789 Other specified health status: Secondary | ICD-10-CM

## 2019-02-12 DIAGNOSIS — E7800 Pure hypercholesterolemia, unspecified: Secondary | ICD-10-CM

## 2019-02-12 MED ORDER — ROSUVASTATIN CALCIUM 20 MG PO TABS: 20.0000 mg | ORAL_TABLET | Freq: Every day | ORAL | 2 refills | Status: DC

## 2019-02-12 NOTE — Progress Notes (Signed)
No show for virtual visit  

## 2019-02-12 NOTE — Progress Notes (Signed)
Established Patient Office Visit  Subjective:  Patient ID: Ray Case, male    DOB: 01/31/1966  Age: 53 y.o. MRN: [id number]  CC:  Chief Complaint  Patient presents with  . 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  Patient has been sober for a month and a half now.  He is currently in counseling.  He is not attending 12-step meetings but he has been spending more time around family and friends and is made it a point to avoid isolating.  He is considering partial retirement to reduce his stress load.  He is contemplating an epidural injection for his chronic back pain.  He continues to take Effexor as prescribed by sports medicine and realizes that that it may also be helping his abstinence efforts.  Past Medical History:  Diagnosis Date  . Achilles tendon rupture 2014   Left  . Complication of anesthesia    Pt had previouds experience of feeling pain after local anesthesia  . Headache(784.0)   . Hyperlipidemia   . Tuberculosis    Pt exposed and medicated  1990's    Past Surgical History:  Procedure Laterality Date  . ACHILLES TENDON SURGERY Left 02/24/2013   Procedure: LEFT ACHILLES TENDON REPAIR;  Surgeon: Cheral AlmasNaiping Michael Xu, MD;  [facility];  Service: Orthopedics;  Laterality: Left;  . FRACTURE SURGERY Right     arm, metal pin    Family History  Problem Relation Age of Onset  . Cancer - Lung Mother        Mother died of lung cancer at age 54 (smoker)  . Hypertension Father        Father died in his 5460s secondary to aortic aneurysm. He has history of stroke and was a smoker  . AAA (abdominal aortic aneurysm) Father   . Diverticulosis Sister   . Schizophrenia Cousin        and nephew  . Obesity Sister        Sister died at age 54 secondary to complications of morbid obesity    Social History   Socioeconomic History  . Marital status: Single    Spouse name:  Not on file  . Number of children: 0  . Years of education: Not on file  . Highest education level: Not on file  Occupational History  . Occupation: professor    Employer: [employer]  Social Needs  . Financial resource strain: Not on file  . Food insecurity    Worry: Not on file    Inability: Not on file  . Transportation needs    Medical: Not on file    Non-medical: Not on file  Tobacco Use  . Smoking status: Former Smoker    Packs/day: 0.25    Years: 8.00    Pack years: 2.00    Types: Cigarettes    Quit date: 12/27/2013    Years since quitting: 5.1  . Smokeless tobacco: Never Used  . Tobacco comment: pt goes in and out trying to quit  Substance and Sexual Activity  . Alcohol use: Yes    Alcohol/week: 4.0 standard drinks    Types: 2 Glasses of wine, 2 Shots of liquor per week    Comment: Prior to his Achilles tendon surgery patient was drinking 2-3 alcoholic beverages per day. He has discontinued alcohol use  . Drug use: Yes    Types: Marijuana  .  Sexual activity: Not on file  Lifestyle  . Physical activity    Days per week: Not on file    Minutes per session: Not on file  . Stress: Not on file  Relationships  . Social Musician on phone: Not on file    Gets together: Not on file    Attends religious service: Not on file    Active member of club or organization: Not on file    Attends meetings of clubs or organizations: Not on file    Relationship status: Not on file  . Intimate partner violence    Fear of current or ex partner: Not on file    Emotionally abused: Not on file    Physically abused: Not on file    Forced sexual activity: Not on file  Other Topics Concern  . 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   One caffeinated beverage daily    Outpatient Medications Prior to Visit  Medication Sig Dispense Refill  . venlafaxine XR (EFFEXOR-XR) 37.5 MG 24 hr capsule TAKE 1 CAPSULE (37.5 MG TOTAL) BY MOUTH DAILY  WITH BREAKFAST. 90 capsule 1  . rosuvastatin (CRESTOR) 20 MG tablet TAKE 1 TABLET BY MOUTH EVERY DAY (Patient taking differently: Take 20 mg by mouth daily. ) 90 tablet 0   No facility-administered medications prior to visit.     No Known Allergies  ROS Review of Systems  Constitutional: Negative.   Respiratory: Negative.   Cardiovascular: Negative.   Gastrointestinal: Negative.   Musculoskeletal: Positive for back pain.  Psychiatric/Behavioral: Negative for behavioral problems and dysphoric mood. The patient is not nervous/anxious.       Objective:    Physical Exam  Constitutional: He is oriented to person, place, and time. He appears well-developed and well-nourished. No distress.  HENT:  Head: Normocephalic and atraumatic.  Right Ear: External ear normal.  Left Ear: External ear normal.  Eyes: Right eye exhibits no discharge. Left eye exhibits no discharge. No scleral icterus.  Neck: No JVD present. No tracheal deviation present.  Pulmonary/Chest: Effort normal. No stridor.  Neurological: He is alert and oriented to person, place, and time.  Skin: He is not diaphoretic.  Psychiatric: He has a normal mood and affect. His behavior is normal.    Ht 5\' 11"  (1.803 m)   BMI 28.45 kg/m  Wt Readings from Last 3 Encounters:  01/14/19 204 lb (92.5 kg)  12/28/18 200 lb (90.7 kg)  01/26/18 206 lb (93.4 kg)   BP Readings from Last 3 Encounters:  01/14/19 126/80  12/30/18 128/82  01/26/18 100/70   Guideline developer:  UpToDate (see UpToDate for funding source) Date Released: June 2014  Health Maintenance Due  Topic Date Due  . July 2014  08/19/1983    There are no preventive care reminders to display for this patient.  Lab Results  Component Value Date   TSH 0.74 09/02/2017   Lab Results  Component Value Date   WBC 7.1 01/14/2019   HGB 13.5 01/14/2019   HCT 41.5 01/14/2019   MCV 91.0 01/14/2019   PLT 253.0 01/14/2019   Lab Results  Component Value Date    NA 139 01/14/2019   K 4.3 01/14/2019   CO2 29 01/14/2019   GLUCOSE 94 01/14/2019   BUN 14 01/14/2019   CREATININE 1.11 01/14/2019   BILITOT 0.3 01/14/2019   ALKPHOS 62 01/14/2019   AST 14 01/14/2019   ALT 18 01/14/2019   PROT  7.1 01/14/2019   ALBUMIN 4.3 01/14/2019   CALCIUM 9.3 01/14/2019   ANIONGAP 8 12/30/2018   GFR 83.40 01/14/2019   Lab Results  Component Value Date   CHOL 154 12/29/2018   Lab Results  Component Value Date   HDL 62 12/29/2018   Lab Results  Component Value Date   LDLCALC 81 12/29/2018   Lab Results  Component Value Date   TRIG 53 12/29/2018   Lab Results  Component Value Date   CHOLHDL 2.5 12/29/2018   Lab Results  Component Value Date   HGBA1C 5.8 (H) 12/29/2018      Assessment & Plan:   Problem List Items Addressed This Visit      Other   Elevated LDL cholesterol level   Relevant Medications   rosuvastatin (CRESTOR) 20 MG tablet    Other Visit Diagnoses    Alcohol use    -  Primary      Meds ordered this encounter  Medications  . rosuvastatin (CRESTOR) 20 MG tablet    Sig: Take 1 tablet (20 mg total) by mouth daily.    Dispense:  90 tablet    Refill:  2    Follow-up: Return in about 3 months (around 05/15/2019).   Encouraged patient to be evaluated try through [place] http://www.wall.info/.  The organization may not be for him but it is worth a look.  And encouraged with his LDL cholesterol results after taking the Crestor.  We will continue that medication for him.  Virtual Visit via Video Note  I connected with Drezden Lok on 02/12/19 at 10:00 AM EST by a video enabled telemedicine application and verified that I am speaking with the correct person using two identifiers.  Location: Patient: home  Provider:    I discussed the limitations of evaluation and management by telemedicine and the availability of in person appointments. The patient expressed understanding and agreed to proceed.  History of Present Illness:     Observations/Objective:   Assessment and Plan:   Follow Up Instructions:    I discussed the assessment and treatment plan with the patient. The patient was provided an opportunity to ask questions and all were answered. The patient agreed with the plan and demonstrated an understanding of the instructions.   The patient was advised to call back or seek an in-person evaluation if the symptoms worsen or if the condition fails to improve as anticipated.  I provided 25 minutes of non-face-to-face time during this encounter.   Libby Maw, MD

## 2019-02-18 ENCOUNTER — Encounter (HOSPITAL_COMMUNITY): Payer: Self-pay | Admitting: Licensed Clinical Social Worker

## 2019-02-18 ENCOUNTER — Other Ambulatory Visit: Payer: Self-pay

## 2019-02-18 ENCOUNTER — Ambulatory Visit (INDEPENDENT_AMBULATORY_CARE_PROVIDER_SITE_OTHER): Payer: BC Managed Care – PPO | Admitting: Licensed Clinical Social Worker

## 2019-02-18 DIAGNOSIS — F411 Generalized anxiety disorder: Secondary | ICD-10-CM | POA: Diagnosis not present

## 2019-02-18 DIAGNOSIS — F1020 Alcohol dependence, uncomplicated: Secondary | ICD-10-CM | POA: Diagnosis not present

## 2019-02-18 NOTE — Progress Notes (Signed)
Virtual Visit via Video Note  I connected with Ray Case on 02/18/19 at  9:00 AM EST by a video enabled telemedicine application and verified that I am speaking with the correct person using two identifiers.    I discussed the limitations of evaluation and management by telemedicine and the availability of in person appointments. The patient expressed understanding and agreed to proceed.  History of Present Illness: Patient is self referred after ending up at the ER because of alcohol use. He discussed with his PCP requesting therapy.   Observations/Objective: Patient presented for his therapy webex appointment. Patient discussed his psychiatric symptoms and current life events. Patient reports his moods have been stable. Discussed his stressors and current coping skills.  He is working on self care. Discussed the importance of self care and self care alternatives.   Assessment and Plan: Counselor will continue to meet with patient to address treatment plan goals. Patient will continue to follow recommendations of provider and implement skills learned in session.     Follow Up Instructions: I will send patient a webex request prior to session. II discussed the assessment and treatment plan with the patient. The patient was provided an opportunity to ask questions and all were answered. The patient agreed with the plan and demonstrated an understanding of the instructions.   The patient was advised to call back or seek an in-person evaluation if the symptoms worsen or if the condition fails to improve as anticipated.  I provided 60 minutes of non-face-to-face time during this encounter.   MACKENZIE,LISBETH S, LCAS

## 2019-03-03 ENCOUNTER — Encounter (HOSPITAL_COMMUNITY): Payer: Self-pay | Admitting: Licensed Clinical Social Worker

## 2019-03-03 ENCOUNTER — Other Ambulatory Visit: Payer: Self-pay

## 2019-03-03 ENCOUNTER — Ambulatory Visit (INDEPENDENT_AMBULATORY_CARE_PROVIDER_SITE_OTHER): Payer: BC Managed Care – PPO | Admitting: Licensed Clinical Social Worker

## 2019-03-03 DIAGNOSIS — F1020 Alcohol dependence, uncomplicated: Secondary | ICD-10-CM | POA: Diagnosis not present

## 2019-03-03 DIAGNOSIS — F411 Generalized anxiety disorder: Secondary | ICD-10-CM

## 2019-03-03 NOTE — Progress Notes (Signed)
Virtual Visit via Video Note  I connected with Quantavious Friebel on 03/03/19 at 10:00 AM EST by a video enabled telemedicine application and verified that I am speaking with the correct person using two identifiers.    I discussed the limitations of evaluation and management by telemedicine and the availability of in person appointments. The patient expressed understanding and agreed to proceed.  History of Present Illness: Patient is self referred after ending up at the ER because of alcohol use. He discussed with his PCP for a referral to therapy.   Observations/Objective: Patient presented for his therapy webex appointment. Patient discussed his psychiatric symptoms and current life events. Patient reports his moods have been stable, even over the holiday. Patient discussed his current stressors at work. Role played scenarios of work stressors. Discussed coping skills for work-place stress.Emailed patient grounding and mindfulness skills and techniques.  PLAN: Review grounding and mindfulness skills emailed  Assessment and Plan: Counselor will continue to meet with patient to address treatment plan goals. Patient will continue to follow recommendations of provider and implement skills learned in session.     Follow Up Instructions: Counseslor will send patient a webex request prior to session. Counselor discussed the assessment and treatment plan with the patient. The patient was provided an opportunity to ask questions and all were answered. The patient agreed with the plan and demonstrated an understanding of the instructions.   The patient was advised to call back or seek an in-person evaluation if the symptoms worsen or if the condition fails to improve as anticipated.  I provided 45 minutes of non-face-to-face time during this encounter.   MACKENZIE,LISBETH S, LCAS

## 2019-03-10 ENCOUNTER — Encounter (HOSPITAL_COMMUNITY): Payer: Self-pay | Admitting: Licensed Clinical Social Worker

## 2019-03-10 ENCOUNTER — Other Ambulatory Visit: Payer: Self-pay

## 2019-03-10 ENCOUNTER — Ambulatory Visit (INDEPENDENT_AMBULATORY_CARE_PROVIDER_SITE_OTHER): Payer: BC Managed Care – PPO | Admitting: Licensed Clinical Social Worker

## 2019-03-10 DIAGNOSIS — F411 Generalized anxiety disorder: Secondary | ICD-10-CM

## 2019-03-10 DIAGNOSIS — F1020 Alcohol dependence, uncomplicated: Secondary | ICD-10-CM

## 2019-03-10 NOTE — Progress Notes (Signed)
Virtual Visit via Video Note  I connected with Ray Case on 03/10/19 at 10:00 AM EST by a video enabled telemedicine application and verified that I am speaking with the correct person using two identifiers.    I discussed the limitations of evaluation and management by telemedicine and the availability of in person appointments. The patient expressed understanding and agreed to proceed.  History of Present Illness: Patient is self referred after ending up at the ER because of alcohol use. He discussed with his PCP for a referral to therapy.   Observations/Objective: Patient presented for his therapy webex appointment. Patient discussed his psychiatric symptoms and current life events. Patient reports his moods have been stable over the week. Patient discussed his current stressors at work. Role played scenarios of work stressors "staff meetings." Discussed coping skills for work-place stress. Began discussion of patient grounding and mindfulness skills and techniques. Will continue discussion next session.  PLAN: Review grounding and mindfulness skills emailed  Assessment and Plan: Counselor will continue to meet with patient to address treatment plan goals. Patient will continue to follow recommendations of provider and implement skills learned in session.     Follow Up Instructions: Counseslor will send patient a webex request prior to session. Counselor discussed the assessment and treatment plan with the patient. The patient was provided an opportunity to ask questions and all were answered. The patient agreed with the plan and demonstrated an understanding of the instructions.   The patient was advised to call back or seek an in-person evaluation if the symptoms worsen or if the condition fails to improve as anticipated.  I provided 60 minutes of non-face-to-face time during this encounter.   Kaevon Cotta S, LCAS

## 2019-03-17 ENCOUNTER — Other Ambulatory Visit: Payer: Self-pay

## 2019-03-17 ENCOUNTER — Ambulatory Visit (INDEPENDENT_AMBULATORY_CARE_PROVIDER_SITE_OTHER): Payer: BC Managed Care – PPO | Admitting: Licensed Clinical Social Worker

## 2019-03-17 ENCOUNTER — Encounter (HOSPITAL_COMMUNITY): Payer: Self-pay | Admitting: Licensed Clinical Social Worker

## 2019-03-17 DIAGNOSIS — F411 Generalized anxiety disorder: Secondary | ICD-10-CM

## 2019-03-17 DIAGNOSIS — F1020 Alcohol dependence, uncomplicated: Secondary | ICD-10-CM

## 2019-03-17 NOTE — Progress Notes (Signed)
Virtual Visit via Video Note  I connected with Ray Case on 03/17/19 at 10:00 AM EST by a video enabled telemedicine application and verified that I am speaking with the correct person using two identifiers.    I discussed the limitations of evaluation and management by telemedicine and the availability of in person appointments. The patient expressed understanding and agreed to proceed.  History of Present Illness: Patient is self referred after ending up at the ER because of alcohol use. He discussed with his PCP for a referral to therapy.   Observations/Objective: Patient presented for his therapy webex appointment. Patient discussed his psychiatric symptoms and current life events. Patient reports his anxiety has increased and is questioning his current medication, Effexor. Counselor suggested maybe having a session with psychiatrist. Patient was in agreement. Used socratic question exploring the increase in anxiety. Reviewed mindfulness and grounding techniques to use as coping skills for increased anxiety. Patient is still resistant to AA. Educated patient on alternatives to AA ([facility]).     PLAN: Review grounding and mindfulness skills emailed  Assessment and Plan: Counselor will continue to meet with patient to address treatment plan goals. Patient will continue to follow recommendations of provider and implement skills learned in session.     Follow Up Instructions: Counseslor will send patient a webex request prior to session. Counselor discussed the assessment and treatment plan with the patient. The patient was provided an opportunity to ask questions and all were answered. The patient agreed with the plan and demonstrated an understanding of the instructions.   The patient was advised to call back or seek an in-person evaluation if the symptoms worsen or if the condition fails to improve as anticipated.  I provided 60 minutes of non-face-to-face time during this  encounter.   Ladan Vanderzanden S, LCAS

## 2019-03-19 ENCOUNTER — Ambulatory Visit (INDEPENDENT_AMBULATORY_CARE_PROVIDER_SITE_OTHER): Payer: BC Managed Care – PPO | Admitting: Family Medicine

## 2019-03-19 DIAGNOSIS — G8929 Other chronic pain: Secondary | ICD-10-CM | POA: Diagnosis not present

## 2019-03-19 DIAGNOSIS — M5441 Lumbago with sciatica, right side: Secondary | ICD-10-CM

## 2019-03-19 NOTE — Progress Notes (Signed)
Virtual Visit via Video Note  I connected with Arbor Kinnard on 03/19/19 at  1:45 PM EST by a video enabled telemedicine application and verified that I am speaking with the correct person using two identifiers.  Location: Patient: Patient in home setting alone Provider: In office setting   I discussed the limitations of evaluation and management by telemedicine and the availability of in person appointments. The patient expressed understanding and agreed to proceed.  History of Present Illness: 53 year old gentleman who continues to have chronic back pain.  Has found to have degenerative disc disease with questionable nerve root impingement.  Patient has declined next phase of treatment which would have been an epidural.  Patient states that even with his work being virtually finds it very difficult to continue doing on a regular basis.  Patient states that he has never without some type of pain.  Walking greater than 2-3 blocks can cause increasing discomfort and pain.  Was doing yoga initially and was doing rather well.  Recently had some difficulty with some alcohol abuse but is doing much better.  Patient is also discontinued Effexor at this time.    Observations/Objective: Alert and oriented x3, very pleasant to talk to, sitting fairly comfortably in a chair and has house   Assessment and Plan: Old gentleman with chronic bilateral low back pain without any significant sciatic, or weakness of the lower extremities, has done formal physical therapy, home exercise program, multiple different medications with varying degrees of success.  Patient still wants to consider the possibility of the epidural but is hesitant to do this.  Patient would like to not to be on Effexor or any other medications at this point.  We discussed anti-inflammatories as a possible suggestion if he needed to.  Patient will get back to [place] if he decides to go through with the epidural.  Encourage patient to continue to do the  home exercises.  Follow-up again in 4 to 8 weeks  Follow Up Instructions:    I discussed the assessment and treatment plan with the patient. The patient was provided an opportunity to ask questions and all were answered. The patient agreed with the plan and demonstrated an understanding of the instructions.   The patient was advised to call back or seek an in-person evaluation if the symptoms worsen or if the condition fails to improve as anticipated.  I provided 4-8 minutes of face-to-face time during this encounter.   Lyndal Pulley, DO

## 2019-03-20 ENCOUNTER — Encounter: Payer: Self-pay | Admitting: Family Medicine

## 2019-03-24 ENCOUNTER — Encounter (HOSPITAL_COMMUNITY): Payer: Self-pay | Admitting: Licensed Clinical Social Worker

## 2019-03-24 ENCOUNTER — Other Ambulatory Visit: Payer: Self-pay

## 2019-03-24 ENCOUNTER — Ambulatory Visit (INDEPENDENT_AMBULATORY_CARE_PROVIDER_SITE_OTHER): Payer: BC Managed Care – PPO | Admitting: Licensed Clinical Social Worker

## 2019-03-24 DIAGNOSIS — F1020 Alcohol dependence, uncomplicated: Secondary | ICD-10-CM

## 2019-03-24 DIAGNOSIS — F411 Generalized anxiety disorder: Secondary | ICD-10-CM

## 2019-03-24 NOTE — Progress Notes (Signed)
Virtual Visit via Video Note  I connected with Ray Case on 03/24/19 at 10:00 AM EST by a video enabled telemedicine application and verified that I am speaking with the correct person using two identifiers.    I discussed the limitations of evaluation and management by telemedicine and the availability of in person appointments. The patient expressed understanding and agreed to proceed.  History of Present Illness: Patient is self referred after ending up at the ER because of alcohol use. He discussed with his PCP for a referral to therapy.   Observations/Objective: Patient presented for his therapy webex appointment. Patient discussed his psychiatric symptoms and current life events. Patient reports he met with his pcp and stopped taking the effexor. Pt described his minimal withdrawal symptoms he was experiencing. Educated patient on the titration. Gave patient # to call the front desk at [facility] to make a psychiatrists appointment Reviewed mindfulness handout with patient. Patient shared a meditation that he uses. Patient discussed the benefit of yoga as part of his self care. Encouraged patient to continue with self care especially during the holiday season.   PLAN: Review grounding and mindfulness skills, 21 days of gratitude  Assessment and Plan: Counselor will continue to meet with patient to address treatment plan goals. Patient will continue to follow recommendations of provider and implement skills learned in session.     Follow Up Instructions: Counseslor will send patient a webex request prior to session. Counselor discussed the assessment and treatment plan with the patient. The patient was provided an opportunity to ask questions and all were answered. The patient agreed with the plan and demonstrated an understanding of the instructions.   The patient was advised to call back or seek an in-person evaluation if the symptoms worsen or if the condition fails to improve as  anticipated.  I provided 60 minutes of non-face-to-face time during this encounter.   Ewen Varnell S, LCAS

## 2019-03-27 ENCOUNTER — Encounter: Payer: Self-pay | Admitting: Family Medicine

## 2019-03-29 ENCOUNTER — Other Ambulatory Visit: Payer: Self-pay

## 2019-03-29 DIAGNOSIS — M5416 Radiculopathy, lumbar region: Secondary | ICD-10-CM

## 2019-03-31 ENCOUNTER — Encounter (HOSPITAL_COMMUNITY): Payer: Self-pay | Admitting: Licensed Clinical Social Worker

## 2019-03-31 ENCOUNTER — Ambulatory Visit
Admission: RE | Admit: 2019-03-31 | Discharge: 2019-03-31 | Disposition: A | Discharge status: Home / Self Care | Payer: BC Managed Care – PPO | Source: Ambulatory Visit | Attending: Family Medicine | Admitting: Family Medicine

## 2019-03-31 ENCOUNTER — Other Ambulatory Visit: Payer: Self-pay

## 2019-03-31 ENCOUNTER — Ambulatory Visit (INDEPENDENT_AMBULATORY_CARE_PROVIDER_SITE_OTHER): Payer: BC Managed Care – PPO | Admitting: Licensed Clinical Social Worker

## 2019-03-31 DIAGNOSIS — F1020 Alcohol dependence, uncomplicated: Secondary | ICD-10-CM | POA: Diagnosis not present

## 2019-03-31 DIAGNOSIS — F411 Generalized anxiety disorder: Secondary | ICD-10-CM

## 2019-03-31 DIAGNOSIS — M5416 Radiculopathy, lumbar region: Secondary | ICD-10-CM

## 2019-03-31 MED ORDER — METHYLPREDNISOLONE ACETATE 40 MG/ML INJ SUSP (RADIOLOG: 120.0000 mg | Freq: Once | INTRAMUSCULAR | Status: DC

## 2019-03-31 MED ORDER — IOPAMIDOL (ISOVUE-M 200) INJECTION 41%
1.0000 mL | Freq: Once | INTRAMUSCULAR | Status: AC
  Administered 2019-03-31: 1 mL via EPIDURAL

## 2019-03-31 NOTE — Discharge Instructions (Signed)

## 2019-03-31 NOTE — Progress Notes (Signed)
Virtual Visit via Video Note  I connected with Ray Case on 03/31/19 at 10:00 AM EST by a video enabled telemedicine application and verified that I am speaking with the correct person using two identifiers.    I discussed the limitations of evaluation and management by telemedicine and the availability of in person appointments. The patient expressed understanding and agreed to proceed.  History of Present Illness: Patient is self referred after ending up at the ER because of alcohol use. He discussed with his PCP for a referral to therapy.   Observations/Objective: Patient presented anxious for his therapy webex appointment. Patient discussed his psychiatric symptoms and current life events. 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 Taught patient the benefits and practice of mindfulness skills. Reviewed gratitude workbook. Patient has begun his gratitude jar and journal. Patient is beginning to feel overwhelmed with the upcoming school semester. Discussed compartmentalization and scheduling to assist patient.Patient made appt with Dr.psychiatrist for January.  PLAN: Review grounding and mindfulness skills, 21 days of gratitude, Dr. Mamie Nick appointment  Assessment and Plan: Counselor will continue to meet with patient to address treatment plan goals. Patient will continue to follow recommendations of provider and implement skills learned in session.     Follow Up Instructions: Counseslor will send patient a webex request prior to session. Counselor discussed the assessment and treatment plan with the patient. The patient was provided an opportunity to ask questions and all were answered. The patient agreed with the plan and demonstrated an understanding of the instructions.   The patient was advised to call back or seek an in-person evaluation if the symptoms worsen or if the condition fails to improve as anticipated.  I provided 60 minutes of  non-face-to-face time during this encounter.   Lorree Millar S, LCAS

## 2019-04-08 ENCOUNTER — Ambulatory Visit (INDEPENDENT_AMBULATORY_CARE_PROVIDER_SITE_OTHER): Payer: BC Managed Care – PPO | Admitting: Licensed Clinical Social Worker

## 2019-04-08 ENCOUNTER — Other Ambulatory Visit: Payer: Self-pay

## 2019-04-08 ENCOUNTER — Encounter (HOSPITAL_COMMUNITY): Payer: Self-pay | Admitting: Licensed Clinical Social Worker

## 2019-04-08 DIAGNOSIS — F1021 Alcohol dependence, in remission: Secondary | ICD-10-CM | POA: Diagnosis not present

## 2019-04-08 DIAGNOSIS — F411 Generalized anxiety disorder: Secondary | ICD-10-CM | POA: Diagnosis not present

## 2019-04-08 NOTE — Progress Notes (Signed)
Virtual Visit via Video Note  I connected with Alcides Volkert on 04/08/19 at 10:00 AM EST by a video enabled telemedicine application and verified that I am speaking with the correct person using two identifiers.    I discussed the limitations of evaluation and management by telemedicine and the availability of in person appointments. The patient expressed understanding and agreed to proceed.  History of Present Illness: Patient is self referred after ending up at the ER because of alcohol use. He discussed with his PCP for a referral to therapy.   Observations/Objective: Patient presented anxious for his therapy webex appointment. Patient discussed his psychiatric symptoms and current life events. 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 Patient reports he has been using the mindfulness skills in practice. In fact, patient is incorporating mindfulness and gratitude in his new semester.  Taught patient the benefits and practice of mindfulness skills. Again, reviewed gratitude workbook/journal. Patient shared his compartmentalization skills he's using and how it's assisting his anxiety. Patient is questioning his AUD dx. Will discuss this at next session. Also, will discuss social anxiety at next session.  PLAN: Review grounding and mindfulness skills, 21 days of gratitude, Dr. Demetrius Charity appointment  Assessment and Plan: Counselor will continue to meet with patient to address treatment plan goals. Patient will continue to follow recommendations of provider and implement skills learned in session.     Follow Up Instructions: Counseslor will send patient a webex request prior to session. Counselor discussed the assessment and treatment plan with the patient. The patient was provided an opportunity to ask questions and all were answered. The patient agreed with the plan and demonstrated an understanding of the instructions.   The patient was advised  to call back or seek an in-person evaluation if the symptoms worsen or if the condition fails to improve as anticipated.  I provided 60 minutes of non-face-to-face time during this encounter.   Riah Kehoe S, LCAS

## 2019-04-19 ENCOUNTER — Ambulatory Visit (INDEPENDENT_AMBULATORY_CARE_PROVIDER_SITE_OTHER): Payer: BC Managed Care – PPO | Admitting: Psychiatry

## 2019-04-19 ENCOUNTER — Other Ambulatory Visit: Payer: Self-pay

## 2019-04-19 DIAGNOSIS — F411 Generalized anxiety disorder: Secondary | ICD-10-CM

## 2019-04-19 DIAGNOSIS — F1021 Alcohol dependence, in remission: Secondary | ICD-10-CM

## 2019-04-19 NOTE — Progress Notes (Signed)
Psychiatric Initial Adult Assessment   Patient Identification: Ray Case MRN:  [id number] Date of Evaluation:  04/19/2019 Referral Source: Ottis Stain LCAS Chief Complaint:  Anxiety. Visit Diagnosis:    ICD-10-CM   1. GAD (generalized anxiety disorder)  F41.1   2. Alcohol use disorder, moderate, in early remission (HCC)  F10.21    Interview was conducted using WebEx teleconferencing application and I verified that I was speaking with the correct person using two identifiers. I discussed the limitations of evaluation and management by telemedicine and  the availability of in person appointments. Patient expressed understanding and agreed to proceed.  History of Present Illness:  Ray Case is a 54 yo single AA male, a tenured dance professor at [employer] who has been in individual counseling with Ottis Stain since  September 2020.  He has a long hx of anxiety (excessive worrying) which he self-medicated with alcohol. On September 28 he was in ED at [facility] with symptoms of alcohol withdrawal and since that time he has been fully sober. Prior to that he was drinking a bottle of wine on daily basis ans well as smoking cannabis. He started drinking as a teenager and it has become a problem around 2012. He eventually stopped and was sober for about 4 years but due to work-related stress he relapsed. He denies having history of DTs or withdrawal seizures. He has been ruminating about work-related stress  - predominant;y having "Issues" with some of his co-workers. He now enjoys working remotely and his stress level is considerably lower. He denies craving for alcohol, his sleep and appetite are good, denies feeling depressed. Over past few months he has gained valuable insights into the nature of his anxiety problems and concluded that he needs to change his work load instead of potentially seeking medications for anxiety. He has been for over a year on Effexor 37.5 mg (briefly on 75 mg which he did not  tolerate). It was prescribed for chronic back pain and he did not necessarily found it to be helpful for anxiety. He did not like feeling that he is relying on a medciation to feel better. He started to receive nerve blockade inejctions which work fine and he stopped taking Effexor a month ago. Ray Case [plan it to start gradual retirement with possibly taking a sabbatical next year. He would also like to limit his work load (I.el seek special accommodations at work) as he feels that should he return to full time work on campus his anxiety will inevitably increase. This in turn may lead to relapse of his alcohol problems drinking and/or cannabis abuse.  Ray Case has no hx of severe depression with hopelessness/SI, mania, psychosis. He has no hx of psychiatric hospital admissions. Other details regarding past and current social history can be found in notes form  [employer] Medically besides lumbar radiculopathy he has problems with dyslipidemia.  Associated Signs/Symptoms: Depression Symptoms:  anxiety, (Hypo) Manic Symptoms:  None Anxiety Symptoms:  Excessive Worry, Psychotic Symptoms:  None PTSD Symptoms: Negative  Past Psychiatric History: see above.  Previous Psychotropic Medications: Yes   Substance Abuse History in the last 12 months:  Yes.    Consequences of Substance Abuse: Withdrawal Symptoms:   Diaphoresis Nausea Vomiting  Past Medical History:  Past Medical History:  Diagnosis Date  . Achilles tendon rupture 2014   Left  . Complication of anesthesia    Pt had previouds experience of feeling pain after local anesthesia  . Headache(784.0)   . Hyperlipidemia   . Tuberculosis  Pt exposed and medicated  1990's    Past Surgical History:  Procedure Laterality Date  . ACHILLES TENDON SURGERY Left 02/24/2013   Procedure: LEFT ACHILLES TENDON REPAIR;  Surgeon: Cheral Almas, MD;  Location: [facility];  Service: Orthopedics;  Laterality: Left;  . FRACTURE SURGERY Right      arm, metal pin    Family Psychiatric History: Reviewed.  Family History:  Family History  Problem Relation Age of Onset  . Cancer - Lung Mother        Mother died of lung cancer at age 94 (smoker)  . Hypertension Father        Father died in his 53s secondary to aortic aneurysm. He has history of stroke and was a smoker  . AAA (abdominal aortic aneurysm) Father   . Diverticulosis Sister   . Schizophrenia Cousin        and nephew  . Obesity Sister        Sister died at age 69 secondary to complications of morbid obesity    Social History:   Social History   Socioeconomic History  . Marital status: Single    Spouse name: Not on file  . Number of children: 0  . Years of education: Not on file  . Highest education level: Not on file  Occupational History  . Occupation: professor    Employer: [employer]  Tobacco Use  . Smoking status: Former Smoker    Packs/day: 0.25    Years: 8.00    Pack years: 2.00    Types: Cigarettes    Quit date: 12/27/2013    Years since quitting: 5.3  . Smokeless tobacco: Never Used  . Tobacco comment: pt goes in and out trying to quit  Substance and Sexual Activity  . Alcohol use: Yes    Alcohol/week: 4.0 standard drinks    Types: 2 Glasses of wine, 2 Shots of liquor per week    Comment: Prior to his Achilles tendon surgery patient was drinking 2-3 alcoholic beverages per day. He has discontinued alcohol use  . Drug use: Yes    Types: Marijuana  . Sexual activity: Not on file  Other Topics Concern  . 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   One caffeinated beverage daily   Social Determinants of Health   Financial Resource Strain:   . Difficulty of Paying Living Expenses: Not on file  Food Insecurity:   . Worried About Programme researcher, broadcasting/film/video in the Last Year: Not on file  . Ran Out of Food in the Last Year: Not on file  Transportation Needs:   . Lack of Transportation (Medical): Not on file  . Lack  of Transportation (Non-Medical): Not on file  Physical Activity:   . Days of Exercise per Week: Not on file  . Minutes of Exercise per Session: Not on file  Stress:   . Feeling of Stress : Not on file  Social Connections:   . Frequency of Communication with Friends and Family: Not on file  . Frequency of Social Gatherings with Friends and Family: Not on file  . Attends Religious Services: Not on file  . Active Member of Clubs or Organizations: Not on file  . Attends Banker Meetings: Not on file  . Marital Status: Not on file     Allergies:  No Known Allergies  Metabolic Disorder Labs: Lab Results  Component Value Date   HGBA1C 5.8 (H) 12/29/2018  MPG 119.76 12/29/2018   No results found for: PROLACTIN Lab Results  Component Value Date   CHOL 154 12/29/2018   TRIG 53 12/29/2018   HDL 62 12/29/2018   CHOLHDL 2.5 12/29/2018   VLDL 11 12/29/2018   LDLCALC 81 12/29/2018   LDLCALC 201 (H) 12/26/2017   Lab Results  Component Value Date   TSH 0.74 09/02/2017    Therapeutic Level Labs: No results found for: LITHIUM No results found for: CBMZ No results found for: VALPROATE  Current Medications: Current Outpatient Medications  Medication Sig Dispense Refill  . rosuvastatin (CRESTOR) 20 MG tablet Take 1 tablet (20 mg total) by mouth daily. 90 tablet 2   No current facility-administered medications for this visit.   Psychiatric Specialty Exam: Review of Systems  Musculoskeletal: Positive for back pain.  Psychiatric/Behavioral: The patient is nervous/anxious.   All other systems reviewed and are negative.   There were no vitals taken for this visit.There is no height or weight on file to calculate BMI.  General Appearance: Casual and Well Groomed  Eye Contact:  Good  Speech:  Clear and Coherent and Normal Rate  Volume:  Normal  Mood:  Anxious  Affect:  Full Range  Thought Process:  Goal Directed and Linear  Orientation:  Full (Time, Place, and  Person)  Thought Content:  Logical  Suicidal Thoughts:  No  Homicidal Thoughts:  No  Memory:  Immediate;   Good Recent;   Good Remote;   Good  Judgement:  Good  Insight:  Good  Psychomotor Activity:  Normal  Concentration:  Concentration: Good  Recall:  Good  Fund of Knowledge:Good  Language: Good  Akathisia:  Negative  Handed:  Right  AIMS (if indicated):  not done  Assets:  Communication Skills Desire for Improvement Financial Resources/Insurance Housing Resilience Social Support Talents/Skills Vocational/Educational  ADL's:  Intact  Cognition: WNL  Sleep:  Good   Screenings: GAD-7     Office Visit from 01/14/2019 in [facility]  Total GAD-7 Score  7    PHQ2-9     Office Visit from 01/14/2019 in [facility]  PHQ-2 Total Score  0  PHQ-9 Total Score  0      Assessment and Plan: 54 yo single AA male, a tenured dance professor at [employer] who has been in individual counseling with Alver Fisher since  September 2020.  He has a long hx of anxiety (excessive worrying) which he self-medicated with alcohol. On September 28 he was in ED at [facility] with symptoms of alcohol withdrawal and since that time he has been fully sober. Prior to that he was drinking a bottle of wine on daily basis ans well as smoking cannabis. He started drinking as a teenager and it has become a problem around 2012. He eventually stopped and was sober for about 4 years but due to work-related stress he relapsed. He denies having history of DTs or withdrawal seizures. He has been ruminating about work-related stress  - predominant;y having "Issues" with some of his co-workers. He now enjoys working remotely and his stress level is considerably lower. He denies craving for alcohol, his sleep and appetite are good, denies feeling depressed. Over past few months he has gained valuable insights into the nature of his anxiety problems and concluded that he needs to change  his work load instead of potentially seeking medications for anxiety. He has been for over a year on Effexor 37.5 mg (briefly on 75 mg which he  did not tolerate). It was prescribed for chronic back pain and he did not necessarily found it to be helpful for anxiety. He did not like feeling that he is relying on a medciation to feel better. He started to receive nerve blockade inejctions which work fine and he stopped taking Effexor a month ago. Ray Case's plan it to start gradual retirement with possibly taking a sabbatical next year. He would also like to limit his work load (I.e. seek special accommodations at work) as he feels that should he return to full time work on campus his anxiety will inevitably increase. This in turn may lead to relapse of his alcohol problems drinking and/or cannabis abuse. He has been fully sober since 9/28.20. Ray Case has no hx of severe depression with hopelessness/SI, mania, psychosis. He has no hx of psychiatric hospital admissions.   Dx: GAD; Alcohol use disorder moderate in early remission  Plan; Patient is not interested in a trial of another antianxiety agent. He will continue individual counseling. At this time he denies craving alcohol and does not see need to start CD-IOP or AA involvement. He would like to have assistance in obtaining special accommodations at work which would allow him to work less (part-time). He will contact his Human Resources about that and should they ask for [place] to fill out any forms to support that we will. Next appointment with me will depend on the outcome of his discussion with HR at [employer]. The plan was discussed with patient who had an opportunity to ask questions and these were all answered. I spend 60 minutes in videoconferencing with the patient.  Magdalene Patricia, MD 1/18/20211:53 PM

## 2019-04-22 ENCOUNTER — Ambulatory Visit (INDEPENDENT_AMBULATORY_CARE_PROVIDER_SITE_OTHER): Payer: BC Managed Care – PPO | Admitting: Licensed Clinical Social Worker

## 2019-04-22 ENCOUNTER — Other Ambulatory Visit: Payer: Self-pay

## 2019-04-22 ENCOUNTER — Encounter (HOSPITAL_COMMUNITY): Payer: Self-pay | Admitting: Licensed Clinical Social Worker

## 2019-04-22 DIAGNOSIS — F411 Generalized anxiety disorder: Secondary | ICD-10-CM | POA: Diagnosis not present

## 2019-04-22 DIAGNOSIS — F1021 Alcohol dependence, in remission: Secondary | ICD-10-CM

## 2019-04-22 NOTE — Progress Notes (Signed)
Virtual Visit via Video Note  I connected with Ray Case on 04/22/19 at 10:00 AM EST by a video enabled telemedicine application and verified that I am speaking with the correct person using two identifiers.    I discussed the limitations of evaluation and management by telemedicine and the availability of in person appointments. The patient expressed understanding and agreed to proceed.  History of Present Illness: Patient is self referred after ending up at the ER because of alcohol use. He discussed with his PCP for a referral to therapy.   Observations/Objective: Patient presented anxious for his therapy webex appointment. Patient discussed his psychiatric symptoms and current life events. Reviewed tx plan with patient who verbalized acceptance of the plan. Patient had a session with Dr. Demetrius Charity, psychiatrist, who mutually decided not to place patient on medication. Patient is now back in school. Discussed his increased anxiety symptoms and coping skills (grounding, relaxation, mindfulness). Practiced coping skills in session. Patient continues to work on gratitude (Scientist, water quality). Reviewed with patient.   PLAN: Patient is questioning his AUD dx. Will discuss this at next session. Also, will discuss social anxiety at next session.  PLAN: Review grounding and mindfulness skills, 21 days of gratitude, Dr. Demetrius Charity appointment  Assessment and Plan: Counselor will continue to meet with patient to address treatment plan goals. Patient will continue to follow recommendations of provider and implement skills learned in session.     Follow Up Instructions: Counseslor will send patient a webex request prior to session. Counselor discussed the assessment and treatment plan with the patient. The patient was provided an opportunity to ask questions and all were answered. The patient agreed with the plan and demonstrated an understanding of the instructions.   The patient was advised to call back or seek an  in-person evaluation if the symptoms worsen or if the condition fails to improve as anticipated.  I provided 60 minutes of non-face-to-face time during this encounter.   [name], LCAS

## 2019-05-07 ENCOUNTER — Encounter: Payer: Self-pay | Admitting: Family Medicine

## 2019-05-10 ENCOUNTER — Other Ambulatory Visit: Payer: Self-pay

## 2019-05-10 DIAGNOSIS — G8929 Other chronic pain: Secondary | ICD-10-CM

## 2019-05-13 ENCOUNTER — Other Ambulatory Visit: Payer: Self-pay

## 2019-05-13 ENCOUNTER — Ambulatory Visit (INDEPENDENT_AMBULATORY_CARE_PROVIDER_SITE_OTHER): Payer: BC Managed Care – PPO | Admitting: Licensed Clinical Social Worker

## 2019-05-13 ENCOUNTER — Encounter (HOSPITAL_COMMUNITY): Payer: Self-pay | Admitting: Licensed Clinical Social Worker

## 2019-05-13 DIAGNOSIS — F411 Generalized anxiety disorder: Secondary | ICD-10-CM | POA: Diagnosis not present

## 2019-05-13 DIAGNOSIS — F1021 Alcohol dependence, in remission: Secondary | ICD-10-CM

## 2019-05-13 NOTE — Progress Notes (Signed)
Virtual Visit via Video Note  I connected with Kary Chirico on 05/13/19 at 10:00 AM EST by a video enabled telemedicine application and verified that I am speaking with the correct person using two identifiers.    I discussed the limitations of evaluation and management by telemedicine and the availability of in person appointments. The patient expressed understanding and agreed to proceed.  History of Present Illness: Patient is self referred after ending up at the ER because of alcohol use. He discussed with his PCP for a referral to therapy.   Observations/Objective: Patient presented anxious for his therapy webex appointment. Patient discussed his psychiatric symptoms and current life events. 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 Coached patient on coping skills (grounding, relaxation, yoga mindfulness, guided meditation, gratitudes). Educated patient on AUD dx.  PLAN: accommodations forms  Assessment and Plan: Counselor will continue to meet with patient to address treatment plan goals. Patient will continue to follow recommendations of provider and implement skills learned in session.   Follow Up Instructions: Counseslor will send patient a webex request prior to session. Counselor discussed the assessment and treatment plan with the patient. The patient was provided an opportunity to ask questions and all were answered. The patient agreed with the plan and demonstrated an understanding of the instructions.   The patient was advised to call back or seek an in-person evaluation if the symptoms worsen or if the condition fails to improve as anticipated.  I provided 60 minutes of non-face-to-face time during this encounter.   Loney Peto S, LCAS

## 2019-05-27 ENCOUNTER — Other Ambulatory Visit: Payer: Self-pay

## 2019-05-27 ENCOUNTER — Ambulatory Visit (INDEPENDENT_AMBULATORY_CARE_PROVIDER_SITE_OTHER): Payer: BC Managed Care – PPO | Admitting: Licensed Clinical Social Worker

## 2019-05-27 ENCOUNTER — Encounter (HOSPITAL_COMMUNITY): Payer: Self-pay | Admitting: Licensed Clinical Social Worker

## 2019-05-27 DIAGNOSIS — F1021 Alcohol dependence, in remission: Secondary | ICD-10-CM | POA: Diagnosis not present

## 2019-05-27 DIAGNOSIS — F411 Generalized anxiety disorder: Secondary | ICD-10-CM | POA: Diagnosis not present

## 2019-05-27 NOTE — Progress Notes (Signed)
Virtual Visit via Video Note  I connected with Ray Case on 05/27/19 at 10:00 AM EST by a video enabled telemedicine application and verified that I am speaking with the correct person using two identifiers.    I discussed the limitations of evaluation and management by telemedicine and the availability of in person appointments. The patient expressed understanding and agreed to proceed.  History of Present Illness: Patient is self referred after ending up at the ER because of alcohol use. He discussed with his PCP for a referral to therapy.   Observations/Objective: Patient presented anxious for his therapy webex appointment. Patient discussed his psychiatric symptoms and current life events. Patient described his anxiety, stressors and coping skills. Patient is waiting for [employer] to send his accommodations paperwork to [facility]. 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  Clinician utilized MI OARS to affirm concerns. Clinician challenged thoughts about this. Clinician processed options for communicating his concerns.  PLAN: accommodations forms  Assessment and Plan: Counselor will continue to meet with patient to address treatment plan goals. Patient will continue to follow recommendations of provider and implement skills learned in session.   Follow Up Instructions: Counseslor will send patient a webex request prior to session. Counselor discussed the assessment and treatment plan with the patient. The patient was provided an opportunity to ask questions and all were answered. The patient agreed with the plan and demonstrated an understanding of the instructions.   The patient was advised to call back or seek an in-person evaluation if the symptoms worsen or if the condition fails to improve as anticipated.  I provided 60 minutes of non-face-to-face time during this encounter.   Burney Calzadilla S, LCAS

## 2019-06-02 ENCOUNTER — Other Ambulatory Visit: Payer: Self-pay | Admitting: Family Medicine

## 2019-06-09 ENCOUNTER — Encounter (HOSPITAL_COMMUNITY): Payer: Self-pay | Admitting: Licensed Clinical Social Worker

## 2019-06-09 ENCOUNTER — Ambulatory Visit (INDEPENDENT_AMBULATORY_CARE_PROVIDER_SITE_OTHER): Payer: BC Managed Care – PPO | Admitting: Licensed Clinical Social Worker

## 2019-06-09 ENCOUNTER — Other Ambulatory Visit: Payer: Self-pay

## 2019-06-09 DIAGNOSIS — F1021 Alcohol dependence, in remission: Secondary | ICD-10-CM | POA: Diagnosis not present

## 2019-06-09 DIAGNOSIS — F411 Generalized anxiety disorder: Secondary | ICD-10-CM | POA: Diagnosis not present

## 2019-06-09 NOTE — Progress Notes (Signed)
Virtual Visit via Video Note  I connected with Ray Case on 06/09/19 at 10:00 AM EST by a video enabled telemedicine application and verified that I am speaking with the correct person using two identifiers.    I discussed the limitations of evaluation and management by telemedicine and the availability of in person appointments. The patient expressed understanding and agreed to proceed.  History of Present Illness: Patient is self referred after ending up at the ER because of alcohol use. He discussed with his PCP for a referral to therapy.   Observations/Objective: Patient presented anxious for his therapy webex appointment. Patient discussed his psychiatric symptoms and current life events. Patient described his anxiety, stressors and coping skills. Patient continues to need accommodations paperwork completed by [facility]. Patient described how he has scheduled his time to include work and self care, which has made his job and anxiety more manageable. Counselor used mindfulness-based stress reduction teaching patient to address his unconscious thoughts, feelings, and behaviors which increase his stress levels.      PLAN: accommodations forms/ dr p appointment  Assessment and Plan: Counselor will continue to meet with patient to address treatment plan goals. Patient will continue to follow recommendations of provider and implement skills learned in session.   Follow Up Instructions: Counseslor will send patient a webex request prior to session. Counselor discussed the assessment and treatment plan with the patient. The patient was provided an opportunity to ask questions and all were answered. The patient agreed with the plan and demonstrated an understanding of the instructions.   The patient was advised to call back or seek an in-person evaluation if the symptoms worsen or if the condition fails to improve as anticipated.  I provided 60 minutes of non-face-to-face time during this  encounter.   Devantae Babe S, LCAS

## 2019-06-14 ENCOUNTER — Other Ambulatory Visit: Payer: Self-pay

## 2019-06-14 ENCOUNTER — Ambulatory Visit (INDEPENDENT_AMBULATORY_CARE_PROVIDER_SITE_OTHER): Payer: BC Managed Care – PPO | Admitting: Psychiatry

## 2019-06-14 DIAGNOSIS — F1021 Alcohol dependence, in remission: Secondary | ICD-10-CM

## 2019-06-14 DIAGNOSIS — F411 Generalized anxiety disorder: Secondary | ICD-10-CM | POA: Diagnosis not present

## 2019-06-14 NOTE — Progress Notes (Signed)
BH MD/PA/NP OP Progress Note  06/14/2019 8:47 AM Ray Case  MRN:  [id number] Interview was conducted by phone and I verified that I was speaking with the correct person using two identifiers. I discussed the limitations of evaluation and management by telemedicine and  the availability of in person appointments. Patient expressed understanding and agreed to proceed.  Chief Complaint:  "I have been more stressed out lately".  HPI: 54 yo single AA male, a tenured dance professor at [employer] who has been in individual counseling with Ottis Stain since September 2020.  He has a long hx of anxiety (excessive worrying) which he self-medicated with alcohol. On September 28 he was in ED at [facility] with symptoms of alcohol withdrawal and since that time he has been fully sober. Prior to that he was drinking a bottle of wine on daily basis ans well as smoking cannabis. He started drinking as a teenager and it has become a problem around 2012. He eventually stopped and was sober for about 4 years but due to work-related stress he relapsed. He denies having history of DTs or withdrawal seizures. He has been ruminating about work-related stress  - predominant;y having "Issues" with some of his co-workers. He enjoys working remotely and his stress level has generally been lower. He denies craving for alcohol, his sleep and appetite are good, denies feeling depressed. Over past few months he has gained valuable insights into the nature of his anxiety problems and concluded that he needs to change his work load instead of potentially seeking medications for anxiety. He has been for over a year on Effexor 37.5 mg (briefly on 75 mg which he did not tolerate). It was prescribed for chronic back pain and he did not necessarily found it to be helpful for anxiety. He did not like feeling that he is relying on a medciation to feel better. He started to receive nerve blockade inejctions which work fine and he stopped taking  Effexor two months ago. Loc's plan it to start gradual retirement with possibly taking a sabbatical next year. He would also like to limit his work load (I.e. seek special accommodations at work) as he feels that should he return to full time work on campus his anxiety will inevitably increase. This in turn may lead to relapse of his alcohol problems drinking and/or cannabis abuse. He has received support and some accomodation from his department chair - does not have to attend faculty meeting for example. His most significant stressor at this time is need to meet with approximately 30 students for 20 minute advising sessions. He was seeking special accommodations to limit/eliminate this requirement but we have not received any paperwork regarding this so far.  He has been fully sober since 9/28.20.   Visit Diagnosis:    ICD-10-CM   1. GAD (generalized anxiety disorder)  F41.1   2. Alcohol use disorder, moderate, in early remission (HCC)  F10.21     Past Psychiatric History: Please see intake H&P.  Past Medical History:  Past Medical History:  Diagnosis Date  . Achilles tendon rupture 2014   Left  . Complication of anesthesia    Pt had previouds experience of feeling pain after local anesthesia  . Headache(784.0)   . Hyperlipidemia   . Tuberculosis    Pt exposed and medicated  1990's    Past Surgical History:  Procedure Laterality Date  . ACHILLES TENDON SURGERY Left 02/24/2013   Procedure: LEFT ACHILLES TENDON REPAIR;  Surgeon: Cheral Almas, MD;  Location: [facility] OR;  Service: Orthopedics;  Laterality: Left;  . FRACTURE SURGERY Right     arm, metal pin    Family Psychiatric History: Reviewed.  Family History:  Family History  Problem Relation Age of Onset  . Cancer - Lung Mother        Mother died of lung cancer at age 29 (smoker)  . Hypertension Father        Father died in his 80s secondary to aortic aneurysm. He has history of stroke and was a smoker  . AAA (abdominal  aortic aneurysm) Father   . Diverticulosis Sister   . Schizophrenia Cousin        and nephew  . Obesity Sister        Sister died at age 13 secondary to complications of morbid obesity    Social History:  Social History   Socioeconomic History  . Marital status: Single    Spouse name: Not on file  . Number of children: 0  . Years of education: Not on file  . Highest education level: Not on file  Occupational History  . Occupation: professor    Employer: [employer]  Tobacco Use  . Smoking status: Former Smoker    Packs/day: 0.25    Years: 8.00    Pack years: 2.00    Types: Cigarettes    Quit date: 12/27/2013    Years since quitting: 5.4  . Smokeless tobacco: Never Used  . Tobacco comment: pt goes in and out trying to quit  Substance and Sexual Activity  . Alcohol use: Yes    Alcohol/week: 4.0 standard drinks    Types: 2 Glasses of wine, 2 Shots of liquor per week    Comment: Prior to his Achilles tendon surgery patient was drinking 2-3 alcoholic beverages per day. He has discontinued alcohol use  . Drug use: Yes    Types: Marijuana  . Sexual activity: Not on file  Other Topics Concern  . 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   One caffeinated beverage daily   Social Determinants of Health   Financial Resource Strain:   . Difficulty of Paying Living Expenses:   Food Insecurity:   . Worried About Programme researcher, broadcasting/film/video in the Last Year:   . Barista in the Last Year:   Transportation Needs:   . Freight forwarder (Medical):   Marland Kitchen Lack of Transportation (Non-Medical):   Physical Activity:   . Days of Exercise per Week:   . Minutes of Exercise per Session:   Stress:   . Feeling of Stress :   Social Connections:   . Frequency of Communication with Friends and Family:   . Frequency of Social Gatherings with Friends and Family:   . Attends Religious Services:   . Active Member of Clubs or Organizations:   . Attends Occupational hygienist Meetings:   Marland Kitchen Marital Status:     Allergies: No Known Allergies  Metabolic Disorder Labs: Lab Results  Component Value Date   HGBA1C 5.8 (H) 12/29/2018   MPG 119.76 12/29/2018   No results found for: PROLACTIN Lab Results  Component Value Date   CHOL 154 12/29/2018   TRIG 53 12/29/2018   HDL 62 12/29/2018   CHOLHDL 2.5 12/29/2018   VLDL 11 12/29/2018   LDLCALC 81 12/29/2018   LDLCALC 201 (H) 12/26/2017   Lab Results  Component Value Date   TSH 0.74 09/02/2017   TSH  1.08 07/04/2014    Therapeutic Level Labs: No results found for: LITHIUM No results found for: VALPROATE No components found for:  CBMZ  Current Medications: Current Outpatient Medications  Medication Sig Dispense Refill  . rosuvastatin (CRESTOR) 20 MG tablet Take 1 tablet (20 mg total) by mouth daily. 90 tablet 2   No current facility-administered medications for this visit.     Psychiatric Specialty Exam: Review of Systems  Musculoskeletal: Positive for back pain.  Psychiatric/Behavioral: The patient is nervous/anxious.   All other systems reviewed and are negative.   There were no vitals taken for this visit.There is no height or weight on file to calculate BMI.  General Appearance: NA  Eye Contact:  NA  Speech:  Clear and Coherent and Normal Rate  Volume:  Normal  Mood:  Anxious  Affect:  NA  Thought Process:  Goal Directed and Linear  Orientation:  Full (Time, Place, and Person)  Thought Content: Logical   Suicidal Thoughts:  No  Homicidal Thoughts:  No  Memory:  Immediate;   Good Recent;   Good Remote;   Good  Judgement:  Good  Insight:  Good  Psychomotor Activity:  NA  Concentration:  Concentration: Good  Recall:  Good  Fund of Knowledge: Good  Language: Good  Akathisia:  Negative  Handed:  Right  AIMS (if indicated): not done  Assets:  Communication Skills Desire for Improvement Financial  Resources/Insurance Housing Resilience Talents/Skills Vocational/Educational  ADL's:  Intact  Cognition: WNL  Sleep:  Fair   Screenings: GAD-7     Office Visit from 01/14/2019 in [facility]  Total GAD-7 Score  7    PHQ2-9     Office Visit from 01/14/2019 in [facility]  PHQ-2 Total Score  0  PHQ-9 Total Score  0       Assessment and Plan:  54 yo single AA male, a tenured dance professor at [employer] who has been in individual counseling with Alver Fisher since September 2020.  He has a long hx of anxiety (excessive worrying) which he self-medicated with alcohol. On September 28 he was in ED at [facility] with symptoms of alcohol withdrawal and since that time he has been fully sober. Prior to that he was drinking a bottle of wine on daily basis ans well as smoking cannabis. He started drinking as a teenager and it has become a problem around 2012. He eventually stopped and was sober for about 4 years but due to work-related stress he relapsed. He denies having history of DTs or withdrawal seizures. He has been ruminating about work-related stress  - predominant;y having "Issues" with some of his co-workers. He enjoys working remotely and his stress level has generally been lower. He denies craving for alcohol, his sleep and appetite are good, denies feeling depressed. Over past few months he has gained valuable insights into the nature of his anxiety problems and concluded that he needs to change his work load instead of potentially seeking medications for anxiety. He has been for over a year on Effexor 37.5 mg (briefly on 75 mg which he did not tolerate). It was prescribed for chronic back pain and he did not necessarily found it to be helpful for anxiety. He did not like feeling that he is relying on a medciation to feel better. He started to receive nerve blockade inejctions which work fine and he stopped taking Effexor two months ago. Dawn's plan it  to start gradual retirement with possibly taking  a sabbatical next year. He would also like to limit his work load (I.e. seek special accommodations at work) as he feels that should he return to full time work on campus his anxiety will inevitably increase. This in turn may lead to relapse of his alcohol problems drinking and/or cannabis abuse. He has received support and some accomodation from his department chair - does not have to attend faculty meeting for example. His most significant stressor at this time is need to meet with approximately 30 students for 20 minute advising sessions. He was seeking special accommodations to limit/eliminate this requirement but we have not received any paperwork regarding this so far.  He has been fully sober since 9/28.20.   Dx: GAD; Alcohol use disorder moderate in early remission  Plan; Patient is not interested in a trial of another antianxiety agent. He continues individual counseling. He denies craving alcohol and does not see need to start CD-IOP or AA involvement. He continue to express need for assistance in obtaining special accommodations at work which would allow him to work less (part-time). He will contact his Human Resources again about that and should they ask for [place] to fill out any forms to support that we will. Next appointment with me on as needed basis. The plan was discussed with patient who had an opportunity to ask questions and these were all answered. I spend 30 minutes in phone consultation with the patient.    Magdalene Patricia, MD 06/14/2019, 8:47 AM

## 2019-06-22 ENCOUNTER — Encounter (HOSPITAL_COMMUNITY): Payer: Self-pay | Admitting: Licensed Clinical Social Worker

## 2019-06-22 ENCOUNTER — Ambulatory Visit (INDEPENDENT_AMBULATORY_CARE_PROVIDER_SITE_OTHER): Payer: BC Managed Care – PPO | Admitting: Licensed Clinical Social Worker

## 2019-06-22 ENCOUNTER — Other Ambulatory Visit: Payer: Self-pay

## 2019-06-22 DIAGNOSIS — F411 Generalized anxiety disorder: Secondary | ICD-10-CM | POA: Diagnosis not present

## 2019-06-22 DIAGNOSIS — F1021 Alcohol dependence, in remission: Secondary | ICD-10-CM

## 2019-06-22 NOTE — Progress Notes (Signed)
Virtual Visit via Video Note  I connected with Ray Case on 06/22/19 at 10:00 AM EDT by a video enabled telemedicine application and verified that I am speaking with the correct person using two identifiers.    I discussed the limitations of evaluation and management by telemedicine and the availability of in person appointments. The patient expressed understanding and agreed to proceed.  History of Present Illness: Patient is self referred after ending up at the ER because of alcohol use. He discussed with his PCP for a referral to therapy.   Observations/Objective: Patient presented anxious for his therapy webex appointment. Patient discussed his psychiatric symptoms and current life events. Patient described his anxiety, stressors and coping skills. Patient continues to need accommodations paperwork completed by [facility]. Spent the majority of the session going through [employer] between [place] and [facility] trying to get the accommodations paperwork completed, discussing what accommodations would be beneficial for patient.      PLAN: accommodations forms/ dr p appointment  Assessment and Plan: Counselor will continue to meet with patient to address treatment plan goals. Patient will continue to follow recommendations of provider and implement skills learned in session.   Follow Up Instructions: Counseslor will send patient a webex request prior to session. Counselor discussed the assessment and treatment plan with the patient. The patient was provided an opportunity to ask questions and all were answered. The patient agreed with the plan and demonstrated an understanding of the instructions.   The patient was advised to call back or seek an in-person evaluation if the symptoms worsen or if the condition fails to improve as anticipated.  I provided 60 minutes of non-face-to-face time during this encounter.   Larell Baney S, LCAS

## 2019-07-27 ENCOUNTER — Encounter (HOSPITAL_COMMUNITY): Payer: Self-pay | Admitting: Licensed Clinical Social Worker

## 2019-07-27 ENCOUNTER — Other Ambulatory Visit: Payer: Self-pay

## 2019-07-27 ENCOUNTER — Ambulatory Visit (INDEPENDENT_AMBULATORY_CARE_PROVIDER_SITE_OTHER): Payer: BC Managed Care – PPO | Admitting: Licensed Clinical Social Worker

## 2019-07-27 ENCOUNTER — Ambulatory Visit (HOSPITAL_COMMUNITY): Payer: BC Managed Care – PPO | Admitting: Licensed Clinical Social Worker

## 2019-07-27 DIAGNOSIS — F1021 Alcohol dependence, in remission: Secondary | ICD-10-CM | POA: Diagnosis not present

## 2019-07-27 DIAGNOSIS — F411 Generalized anxiety disorder: Secondary | ICD-10-CM | POA: Diagnosis not present

## 2019-07-27 NOTE — Progress Notes (Signed)
Virtual Visit via Video Note  I connected with Ray Case on 07/27/19 at 11:00 AM EDT by a video enabled telemedicine application and verified that I am speaking with the correct person using two identifiers.    I discussed the limitations of evaluation and management by telemedicine and the availability of in person appointments. The patient expressed understanding and agreed to proceed.  History of Present Illness: Patient is self referred after ending up at the ER because of alcohol use. He discussed with his PCP for a referral to therapy.   Observations/Objective: Patient presented anxious for his therapy webex appointment. Patient discussed his psychiatric symptoms and current life events. Patient described his anxiety, stressors and coping skills. Reviewed tx plan with patient who verbalized his acceptance of the plan. Patient reports the semester is over, which means his stressor has minimized. 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 Validated his sense of accomplishments and self affirmations. Patient reports "I have been focusing on my self care", where he defines what he has been doing for his self care. Patient reports on an occasion where he was offered wine with dinner and his reactions to the situation. Patient reports he was able to not drink and was fine with not drinking, felt no peer pressure. Discussed with patient continued sessions in the summer. He wants to continue monthly sessions. Made new appointment..       Assessment and Plan: Counselor will continue to meet with patient to address treatment plan goals. Patient will continue to follow recommendations of provider and implement skills learned in session.   Follow Up Instructions: Counseslor will send patient a webex request prior to session. Counselor discussed the assessment and treatment plan with the patient. The patient was provided an opportunity to ask questions and all were answered.  The patient agreed with the plan and demonstrated an understanding of the instructions.   The patient was advised to call back or seek an in-person evaluation if the symptoms worsen or if the condition fails to improve as anticipated.  I provided 60 minutes of non-face-to-face time during this encounter.   Rodolfo Notaro S, LCAS

## 2019-09-07 ENCOUNTER — Ambulatory Visit (HOSPITAL_COMMUNITY): Payer: BC Managed Care – PPO | Admitting: Licensed Clinical Social Worker

## 2019-09-13 ENCOUNTER — Ambulatory Visit (INDEPENDENT_AMBULATORY_CARE_PROVIDER_SITE_OTHER): Payer: BC Managed Care – PPO | Admitting: Licensed Clinical Social Worker

## 2019-09-13 ENCOUNTER — Encounter (HOSPITAL_COMMUNITY): Payer: Self-pay | Admitting: Licensed Clinical Social Worker

## 2019-09-13 ENCOUNTER — Other Ambulatory Visit: Payer: Self-pay

## 2019-09-13 DIAGNOSIS — F1021 Alcohol dependence, in remission: Secondary | ICD-10-CM | POA: Diagnosis not present

## 2019-09-13 DIAGNOSIS — F411 Generalized anxiety disorder: Secondary | ICD-10-CM | POA: Diagnosis not present

## 2019-09-13 NOTE — Progress Notes (Signed)
Virtual Visit via Video Note  I connected with Ray Case on 09/13/19 at  1:00 PM EDT by a video enabled telemedicine application and verified that I am speaking with the correct person using two identifiers.    I discussed the limitations of evaluation and management by telemedicine and the availability of in person appointments. The patient expressed understanding and agreed to proceed.  LOCATION:  Patient: home Provider: home  History of Present Illness: Patient is self referred after ending up at the ER because of alcohol use. He discussed with his PCP for a referral to therapy.   Observations/Objective: Patient presented WNL for his therapy webex appointment. Patient discussed his psychiatric symptoms and current life events. Patient described his anxiety, stressors and coping skills over the last 6 weeks, since last session.  Patient described his access to alcohol since last session. "It wasn't hard to not drink." patient discussed his next month of travel. Upon return in August, "I will be preparing for school again, which may be stressful." Patient requested coping skills for his moods while traveling: mindfulness skills, breathing exercises, grounding exercises. Patient requested an appointment for August when school starts.  Assessment and Plan: Counselor will continue to meet with patient to address treatment plan goals. Patient will continue to follow recommendations of provider and implement skills learned in session.   Follow Up Instructions: Counseslor will send patient a webex request prior to session. Counselor discussed the assessment and treatment plan with the patient. The patient was provided an opportunity to ask questions and all were answered. The patient agreed with the plan and demonstrated an understanding of the instructions.   The patient was advised to call back or seek an in-person evaluation if the symptoms worsen or if the condition fails to improve as  anticipated.  I provided 60 minutes of non-face-to-face time during this encounter.   Ray Case, LCAS

## 2019-09-21 ENCOUNTER — Other Ambulatory Visit: Payer: Self-pay

## 2019-09-22 ENCOUNTER — Encounter: Payer: Self-pay | Admitting: Family Medicine

## 2019-09-22 ENCOUNTER — Ambulatory Visit (INDEPENDENT_AMBULATORY_CARE_PROVIDER_SITE_OTHER): Payer: BC Managed Care – PPO | Admitting: Family Medicine

## 2019-09-22 VITALS — BP 126/78 | HR 82 | Temp 97.1°F | Ht 71.0 in | Wt 217.6 lb

## 2019-09-22 DIAGNOSIS — E7800 Pure hypercholesterolemia, unspecified: Secondary | ICD-10-CM

## 2019-09-22 DIAGNOSIS — Z0000 Encounter for general adult medical examination without abnormal findings: Secondary | ICD-10-CM | POA: Diagnosis not present

## 2019-09-22 LAB — URINALYSIS, ROUTINE W REFLEX MICROSCOPIC
Bilirubin Urine: NEGATIVE
Hgb urine dipstick: NEGATIVE
Ketones, ur: NEGATIVE
Leukocytes,Ua: NEGATIVE
Nitrite: NEGATIVE
RBC / HPF: NONE SEEN (ref 0–?)
Specific Gravity, Urine: 1.02 (ref 1.000–1.030)
Total Protein, Urine: NEGATIVE
Urine Glucose: NEGATIVE
Urobilinogen, UA: 0.2 (ref 0.0–1.0)
pH: 6 (ref 5.0–8.0)

## 2019-09-22 LAB — CBC
HCT: 42.2 % (ref 39.0–52.0)
Hemoglobin: 14.2 g/dL (ref 13.0–17.0)
MCHC: 33.7 g/dL (ref 30.0–36.0)
MCV: 86.8 fl (ref 78.0–100.0)
Platelets: 182 10*3/uL (ref 150.0–400.0)
RBC: 4.87 Mil/uL (ref 4.22–5.81)
RDW: 14 % (ref 11.5–15.5)
WBC: 4.7 10*3/uL (ref 4.0–10.5)

## 2019-09-22 LAB — COMPREHENSIVE METABOLIC PANEL
ALT: 28 U/L (ref 0–53)
AST: 19 U/L (ref 0–37)
Albumin: 4.4 g/dL (ref 3.5–5.2)
Alkaline Phosphatase: 58 U/L (ref 39–117)
BUN: 13 mg/dL (ref 6–23)
CO2: 30 mEq/L (ref 19–32)
Calcium: 9.6 mg/dL (ref 8.4–10.5)
Chloride: 103 mEq/L (ref 96–112)
Creatinine, Ser: 0.97 mg/dL (ref 0.40–1.50)
GFR: 97.2 mL/min (ref >60.00)
Glucose, Bld: 93 mg/dL (ref 70–99)
Potassium: 4.6 mEq/L (ref 3.5–5.1)
Sodium: 140 mEq/L (ref 135–145)
Total Bilirubin: 0.5 mg/dL (ref 0.2–1.2)
Total Protein: 7 g/dL (ref 6.0–8.3)

## 2019-09-22 LAB — LIPID PANEL
Cholesterol: 174 mg/dL (ref 0–200)
HDL: 47.8 mg/dL (ref >39.00)
LDL Cholesterol: 111 mg/dL — ABNORMAL HIGH (ref 0–99)
NonHDL: 126.67
Total CHOL/HDL Ratio: 4
Triglycerides: 76 mg/dL (ref 0.0–149.0)
VLDL: 15.2 mg/dL (ref 0.0–40.0)

## 2019-09-22 LAB — LDL CHOLESTEROL, DIRECT: Direct LDL: 102 mg/dL

## 2019-09-22 LAB — PSA: PSA: 2.2 ng/mL (ref 0.10–4.00)

## 2019-09-22 NOTE — Patient Instructions (Signed)
Health Maintenance, Male Adopting a healthy lifestyle and getting preventive care are important in promoting health and wellness. Ask your health care provider about:  The right schedule for you to have regular tests and exams.  Things you can do on your own to prevent diseases and keep yourself healthy. What should I know about diet, weight, and exercise? Eat a healthy diet   Eat a diet that includes plenty of vegetables, fruits, low-fat dairy products, and lean protein.  Do not eat a lot of foods that are high in solid fats, added sugars, or sodium. Maintain a healthy weight Body mass index (BMI) is a measurement that can be used to identify possible weight problems. It estimates body fat based on height and weight. Your health care provider can help determine your BMI and help you achieve or maintain a healthy weight. Get regular exercise Get regular exercise. This is one of the most important things you can do for your health. Most adults should:  Exercise for at least 150 minutes each week. The exercise should increase your heart rate and make you sweat (moderate-intensity exercise).  Do strengthening exercises at least twice a week. This is in addition to the moderate-intensity exercise.  Spend less time sitting. Even light physical activity can be beneficial. Watch cholesterol and blood lipids Have your blood tested for lipids and cholesterol at 55 years of age, then have this test every 5 years. You may need to have your cholesterol levels checked more often if:  Your lipid or cholesterol levels are high.  You are older than 55 years of age.  You are at high risk for heart disease. What should I know about cancer screening? Many types of cancers can be detected early and may often be prevented. Depending on your health history and family history, you may need to have cancer screening at various ages. This may include screening for:  Colorectal cancer.  Prostate  cancer.  Skin cancer.  Lung cancer. What should I know about heart disease, diabetes, and high blood pressure? Blood pressure and heart disease  High blood pressure causes heart disease and increases the risk of stroke. This is more likely to develop in people who have high blood pressure readings, are of African descent, or are overweight.  Talk with your health care provider about your target blood pressure readings.  Have your blood pressure checked: ? Every 3-5 years if you are 49-84 years of age. ? Every year if you are 77 years old or older.  If you are between the ages of 55 and 72 and are a current or former smoker, ask your health care provider if you should have a one-time screening for abdominal aortic aneurysm (AAA). Diabetes Have regular diabetes screenings. This checks your fasting blood sugar level. Have the screening done:  Once every three years after age 17 if you are at a normal weight and have a low risk for diabetes.  More often and at a younger age if you are overweight or have a high risk for diabetes. What should I know about preventing infection? Hepatitis B If you have a higher risk for hepatitis B, you should be screened for this virus. Talk with your health care provider to find out if you are at risk for hepatitis B infection. Hepatitis C Blood testing is recommended for:  Everyone born from [birth year] through 1966.  Anyone with known risk factors for hepatitis C. Sexually transmitted infections (STIs)  You should be screened each year  for STIs, including gonorrhea and chlamydia, if: ? You are sexually active and are younger than 55 years of age. ? You are older than 55 years of age and your health care provider tells you that you are at risk for this type of infection. ? Your sexual activity has changed since you were last screened, and you are at increased risk for chlamydia or gonorrhea. Ask your health care provider if you are at risk.  Ask your  health care provider about whether you are at high risk for HIV. Your health care provider may recommend a prescription medicine to help prevent HIV infection. If you choose to take medicine to prevent HIV, you should first get tested for HIV. You should then be tested every 3 months for as long as you are taking the medicine. Follow these instructions at home: Lifestyle  Do not use any products that contain nicotine or tobacco, such as cigarettes, e-cigarettes, and chewing tobacco. If you need help quitting, ask your health care provider.  Do not use street drugs.  Do not share needles.  Ask your health care provider for help if you need support or information about quitting drugs. Alcohol use  Do not drink alcohol if your health care provider tells you not to drink.  If you drink alcohol: ? Limit how much you have to 0-2 drinks a day. ? Be aware of how much alcohol is in your drink. In the [place] one drink equals one 12 oz bottle of beer (355 mL), one 5 oz glass of wine (148 mL), or one 1 oz glass of hard liquor (44 mL). General instructions  Schedule regular health, dental, and eye exams.  Stay current with your vaccines.  Tell your health care provider if: ? You often feel depressed. ? You have ever been abused or do not feel safe at home. Summary  Adopting a healthy lifestyle and getting preventive care are important in promoting health and wellness.  Follow your health care provider's instructions about healthy diet, exercising, and getting tested or screened for diseases.  Follow your health care provider's instructions on monitoring your cholesterol and blood pressure. This information is not intended to replace advice given to you by your health care provider. Make sure you discuss any questions you have with your health care provider. Document Revised: 03/11/2018 Document Reviewed: 03/11/2018 Elsevier Patient Education  2020 Max Years  Old, Male Preventive care refers to lifestyle choices and visits with your health care provider that can promote health and wellness. This includes:  A yearly physical exam. This is also called an annual well check.  Regular dental and eye exams.  Immunizations.  Screening for certain conditions.  Healthy lifestyle choices, such as eating a healthy diet, getting regular exercise, not using drugs or products that contain nicotine and tobacco, and limiting alcohol use. What can I expect for my preventive care visit? Physical exam Your health care provider will check:  Height and weight. These may be used to calculate body mass index (BMI), which is a measurement that tells if you are at a healthy weight.  Heart rate and blood pressure.  Your skin for abnormal spots. Counseling Your health care provider may ask you questions about:  Alcohol, tobacco, and drug use.  Emotional well-being.  Home and relationship well-being.  Sexual activity.  Eating habits.  Work and work Statistician. What immunizations do I need?  Influenza (flu) vaccine  This is recommended every year. Tetanus, diphtheria,  and pertussis (Tdap) vaccine  You may need a Td booster every 10 years. Varicella (chickenpox) vaccine  You may need this vaccine if you have not already been vaccinated. Zoster (shingles) vaccine  You may need this after age 63. Measles, mumps, and rubella (MMR) vaccine  You may need at least one dose of MMR if you were born in 1957 or later. You may also need a second dose. Pneumococcal conjugate (PCV13) vaccine  You may need this if you have certain conditions and were not previously vaccinated. Pneumococcal polysaccharide (PPSV23) vaccine  You may need one or two doses if you smoke cigarettes or if you have certain conditions. Meningococcal conjugate (MenACWY) vaccine  You may need this if you have certain conditions. Hepatitis A vaccine  You may need this if you have  certain conditions or if you travel or work in places where you may be exposed to hepatitis A. Hepatitis B vaccine  You may need this if you have certain conditions or if you travel or work in places where you may be exposed to hepatitis B. Haemophilus influenzae type b (Hib) vaccine  You may need this if you have certain risk factors. Human papillomavirus (HPV) vaccine  If recommended by your health care provider, you may need three doses over 6 months. You may receive vaccines as individual doses or as more than one vaccine together in one shot (combination vaccines). Talk with your health care provider about the risks and benefits of combination vaccines. What tests do I need? Blood tests  Lipid and cholesterol levels. These may be checked every 5 years, or more frequently if you are over 68 years old.  Hepatitis C test.  Hepatitis B test. Screening  Lung cancer screening. You may have this screening every year starting at age 78 if you have a 30-pack-year history of smoking and currently smoke or have quit within the past 15 years.  Prostate cancer screening. Recommendations will vary depending on your family history and other risks.  Colorectal cancer screening. All adults should have this screening starting at age 38 and continuing until age 22. Your health care provider may recommend screening at age 73 if you are at increased risk. You will have tests every 1-10 years, depending on your results and the type of screening test.  Diabetes screening. This is done by checking your blood sugar (glucose) after you have not eaten for a while (fasting). You may have this done every 1-3 years.  Sexually transmitted disease (STD) testing. Follow these instructions at home: Eating and drinking  Eat a diet that includes fresh fruits and vegetables, whole grains, lean protein, and low-fat dairy products.  Take vitamin and mineral supplements as recommended by your health care  provider.  Do not drink alcohol if your health care provider tells you not to drink.  If you drink alcohol: ? Limit how much you have to 0-2 drinks a day. ? Be aware of how much alcohol is in your drink. In the [place] one drink equals one 12 oz bottle of beer (355 mL), one 5 oz glass of wine (148 mL), or one 1 oz glass of hard liquor (44 mL). Lifestyle  Take daily care of your teeth and gums.  Stay active. Exercise for at least 30 minutes on 5 or more days each week.  Do not use any products that contain nicotine or tobacco, such as cigarettes, e-cigarettes, and chewing tobacco. If you need help quitting, ask your health care provider.  If  you are sexually active, practice safe sex. Use a condom or other form of protection to prevent STIs (sexually transmitted infections).  Talk with your health care provider about taking a low-dose aspirin every day starting at age 70. What's next?  Go to your health care provider once a year for a well check visit.  Ask your health care provider how often you should have your eyes and teeth checked.  Stay up to date on all vaccines. This information is not intended to replace advice given to you by your health care provider. Make sure you discuss any questions you have with your health care provider. Document Revised: 03/12/2018 Document Reviewed: 03/12/2018 Elsevier Patient Education  2020 [employer].

## 2019-09-22 NOTE — Progress Notes (Signed)
Established Patient Office Visit  Subjective:  Patient ID: Ray Case, male    DOB: 23-Dec-1965  Age: 54 y.o. MRN: [id number]  CC:  Chief Complaint  Patient presents with  . Annual Exam    CPE, pt states that his skin becomes very itchy after he showers but this is only at home. Not sure the cause of this.     HPI Ray Case presents for physical exam and follow-up of his cholesterol.  Continues to take Crestor without issue.  Has been doing great.  No alcohol since this past September.  Working out regularly.  Seeing a therapist regularly.  Has been able to lose some weight and back pain is improving greatly.  Planning on traveling this summer.  Urine flow is good except for some trickle with the first morning void.  Some nocturia when he drinks fluids late at night.  Has been much healthier with his dietary intake and is increased his water consumption and feels great.  Skin is sometimes dry after showering.  He is using Dove for men and applying lotion directly after showering.  Past Medical History:  Diagnosis Date  . Achilles tendon rupture 2014   Left  . Complication of anesthesia    Pt had previouds experience of feeling pain after local anesthesia  . Headache(784.0)   . Hyperlipidemia   . Tuberculosis    Pt exposed and medicated  1990's    Past Surgical History:  Procedure Laterality Date  . ACHILLES TENDON SURGERY Left 02/24/2013   Procedure: LEFT ACHILLES TENDON REPAIR;  Surgeon: Cheral Almas, MD;  Location: [facility] OR;  Service: Orthopedics;  Laterality: Left;  . FRACTURE SURGERY Right     arm, metal pin    Family History  Problem Relation Age of Onset  . Cancer - Lung Mother        Mother died of lung cancer at age 24 (smoker)  . Hypertension Father        Father died in his 27s secondary to aortic aneurysm. He has history of stroke and was a smoker  . AAA (abdominal aortic aneurysm) Father   . Diverticulosis Sister   . Schizophrenia Cousin        and  nephew  . Obesity Sister        Sister died at age 36 secondary to complications of morbid obesity    Social History   Socioeconomic History  . Marital status: Single    Spouse name: Not on file  . Number of children: 0  . Years of education: Not on file  . Highest education level: Not on file  Occupational History  . Occupation: professor    Employer: [employer]  Tobacco Use  . Smoking status: Former Smoker    Packs/day: 0.25    Years: 8.00    Pack years: 2.00    Types: Cigarettes    Quit date: 12/27/2013    Years since quitting: 5.7  . Smokeless tobacco: Never Used  . Tobacco comment: pt goes in and out trying to quit  Substance and Sexual Activity  . Alcohol use: Not Currently    Comment: None since 9/20.   . Drug use: Yes    Types: Marijuana  . Sexual activity: Not on file  Other Topics Concern  . 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   One caffeinated beverage daily   Social Determinants of Health   Financial Resource Strain:   .  Difficulty of Paying Living Expenses:   Food Insecurity:   . Worried About Charity fundraiser in the Last Year:   . Arboriculturist in the Last Year:   Transportation Needs:   . Film/video editor (Medical):   Marland Kitchen Lack of Transportation (Non-Medical):   Physical Activity:   . Days of Exercise per Week:   . Minutes of Exercise per Session:   Stress:   . Feeling of Stress :   Social Connections:   . Frequency of Communication with Friends and Family:   . Frequency of Social Gatherings with Friends and Family:   . Attends Religious Services:   . Active Member of Clubs or Organizations:   . Attends Archivist Meetings:   Marland Kitchen Marital Status:   Intimate Partner Violence:   . Fear of Current or Ex-Partner:   . Emotionally Abused:   Marland Kitchen Physically Abused:   . Sexually Abused:     Outpatient Medications Prior to Visit  Medication Sig Dispense Refill  . rosuvastatin (CRESTOR) 20 MG  tablet Take 1 tablet (20 mg total) by mouth daily. 90 tablet 2   No facility-administered medications prior to visit.    No Known Allergies  ROS Review of Systems  Constitutional: Negative.   HENT: Negative.   Eyes: Negative for photophobia and visual disturbance.  Respiratory: Negative.   Cardiovascular: Negative.   Gastrointestinal: Negative.   Endocrine: Negative for polyphagia and polyuria.  Genitourinary: Negative.   Musculoskeletal: Negative for gait problem and joint swelling.  Skin: Negative for pallor and rash.  Allergic/Immunologic: Negative for immunocompromised state.  Neurological: Negative for light-headedness and numbness.  Hematological: Does not bruise/bleed easily.  Psychiatric/Behavioral: Negative.       Objective:    Physical Exam Vitals and nursing note reviewed.  Constitutional:      General: He is not in acute distress.    Appearance: Normal appearance. He is normal weight. He is not ill-appearing, toxic-appearing or diaphoretic.  HENT:     Head: Normocephalic and atraumatic.     Right Ear: Tympanic membrane, ear canal and external ear normal.     Left Ear: Tympanic membrane, ear canal and external ear normal.     Mouth/Throat:     Mouth: Mucous membranes are moist.     Pharynx: Oropharynx is clear. No oropharyngeal exudate or posterior oropharyngeal erythema.  Eyes:     General: No scleral icterus.       Right eye: No discharge.        Left eye: No discharge.     Extraocular Movements: Extraocular movements intact.     Conjunctiva/sclera: Conjunctivae normal.     Pupils: Pupils are equal, round, and reactive to light.  Cardiovascular:     Rate and Rhythm: Normal rate and regular rhythm.  Pulmonary:     Effort: Pulmonary effort is normal.     Breath sounds: Normal breath sounds.  Abdominal:     General: Bowel sounds are normal.  Musculoskeletal:     Cervical back: No rigidity or tenderness.     Right lower leg: No edema.     Left lower  leg: No edema.  Lymphadenopathy:     Cervical: No cervical adenopathy.  Skin:    General: Skin is warm and dry.  Neurological:     Mental Status: He is alert and oriented to person, place, and time.  Psychiatric:        Mood and Affect: Mood normal.  Behavior: Behavior normal.     BP 126/78   Pulse 82   Temp (!) 97.1 F (36.2 C) (Tympanic)   Ht 5\' 11"  (1.803 m)   Wt 217 lb 9.6 oz (98.7 kg)   SpO2 95%   BMI 30.35 kg/m  Wt Readings from Last 3 Encounters:  09/22/19 217 lb 9.6 oz (98.7 kg)  01/14/19 204 lb (92.5 kg)  12/28/18 200 lb (90.7 kg)     Health Maintenance Due  Topic Date Due  . Hepatitis C Screening  Never done  . TETANUS/TDAP  Never done    There are no preventive care reminders to display for this patient.  Lab Results  Component Value Date   TSH 0.74 09/02/2017   Lab Results  Component Value Date   WBC 7.1 01/14/2019   HGB 13.5 01/14/2019   HCT 41.5 01/14/2019   MCV 91.0 01/14/2019   PLT 253.0 01/14/2019   Lab Results  Component Value Date   NA 139 01/14/2019   K 4.3 01/14/2019   CO2 29 01/14/2019   GLUCOSE 94 01/14/2019   BUN 14 01/14/2019   CREATININE 1.11 01/14/2019   BILITOT 0.3 01/14/2019   ALKPHOS 62 01/14/2019   AST 14 01/14/2019   ALT 18 01/14/2019   PROT 7.1 01/14/2019   ALBUMIN 4.3 01/14/2019   CALCIUM 9.3 01/14/2019   ANIONGAP 8 12/30/2018   GFR 83.40 01/14/2019   Lab Results  Component Value Date   CHOL 154 12/29/2018   Lab Results  Component Value Date   HDL 62 12/29/2018   Lab Results  Component Value Date   LDLCALC 81 12/29/2018   Lab Results  Component Value Date   TRIG 53 12/29/2018   Lab Results  Component Value Date   CHOLHDL 2.5 12/29/2018   Lab Results  Component Value Date   HGBA1C 5.8 (H) 12/29/2018      Assessment & Plan:   Problem List Items Addressed This Visit      Other   Elevated LDL cholesterol level   Relevant Orders   Comprehensive metabolic panel   Lipid panel   LDL  cholesterol, direct    Other Visit Diagnoses    Healthcare maintenance    -  Primary   Relevant Orders   CBC   Comprehensive metabolic panel   Urinalysis, Routine w reflex microscopic   PSA   Hepatitis C antibody      No orders of the defined types were placed in this encounter.   Follow-up: Return in about 6 months (around 03/23/2020).    03/25/2020, MD

## 2019-09-23 LAB — HEPATITIS C ANTIBODY
Hepatitis C Ab: NONREACTIVE
SIGNAL TO CUT-OFF: 0.01 (ref <1.00)

## 2019-11-23 ENCOUNTER — Ambulatory Visit (HOSPITAL_COMMUNITY): Payer: BC Managed Care – PPO | Admitting: Licensed Clinical Social Worker

## 2019-12-02 ENCOUNTER — Ambulatory Visit (HOSPITAL_COMMUNITY): Payer: BC Managed Care – PPO | Admitting: Licensed Clinical Social Worker

## 2019-12-17 ENCOUNTER — Other Ambulatory Visit: Payer: Self-pay | Admitting: Family Medicine

## 2019-12-17 DIAGNOSIS — E7800 Pure hypercholesterolemia, unspecified: Secondary | ICD-10-CM

## 2019-12-31 ENCOUNTER — Other Ambulatory Visit: Payer: Self-pay

## 2019-12-31 ENCOUNTER — Encounter: Payer: Self-pay | Admitting: Family Medicine

## 2019-12-31 DIAGNOSIS — M5416 Radiculopathy, lumbar region: Secondary | ICD-10-CM

## 2019-12-31 DIAGNOSIS — G8929 Other chronic pain: Secondary | ICD-10-CM

## 2020-01-17 ENCOUNTER — Ambulatory Visit: Payer: BC Managed Care – PPO | Admitting: Family Medicine

## 2020-01-17 ENCOUNTER — Ambulatory Visit: Payer: BC Managed Care – PPO | Attending: Family Medicine | Admitting: Physical Therapy

## 2020-01-17 ENCOUNTER — Other Ambulatory Visit: Payer: Self-pay

## 2020-01-17 ENCOUNTER — Encounter: Payer: Self-pay | Admitting: Family Medicine

## 2020-01-17 ENCOUNTER — Encounter: Payer: Self-pay | Admitting: Physical Therapy

## 2020-01-17 VITALS — BP 134/76 | HR 81 | Temp 97.1°F | Ht 71.0 in | Wt 228.6 lb

## 2020-01-17 DIAGNOSIS — G8929 Other chronic pain: Secondary | ICD-10-CM

## 2020-01-17 DIAGNOSIS — L309 Dermatitis, unspecified: Secondary | ICD-10-CM | POA: Diagnosis not present

## 2020-01-17 DIAGNOSIS — L853 Xerosis cutis: Secondary | ICD-10-CM

## 2020-01-17 DIAGNOSIS — M5416 Radiculopathy, lumbar region: Secondary | ICD-10-CM

## 2020-01-17 DIAGNOSIS — Z23 Encounter for immunization: Secondary | ICD-10-CM | POA: Diagnosis not present

## 2020-01-17 DIAGNOSIS — M545 Low back pain, unspecified: Secondary | ICD-10-CM | POA: Insufficient documentation

## 2020-01-17 MED ORDER — TRIAMCINOLONE ACETONIDE 0.1 % EX OINT: TOPICAL_OINTMENT | CUTANEOUS | 0 refills | Status: DC

## 2020-01-17 MED ORDER — PREDNISONE 10 MG (21) PO TBPK: ORAL_TABLET | ORAL | 0 refills | Status: DC

## 2020-01-17 NOTE — Progress Notes (Signed)
Established Patient Office Visit  Subjective:  Patient ID: Ray Case, male    DOB: 1965-08-14  Age: 54 y.o. MRN: [id number]  CC:  Chief Complaint  Patient presents with  . Back Pain    C/O lower back pains, preventing pt from walking long distance, weight gain skin very itchy all over.     HPI Ray Case presents for follow-up of his lower back radiculopathy.  Surgery has been recommended and of course he is hesitant.  Injections to date have not helped.  Physical therapy has been recommended and he will go forward with it.  It is hard for him to ambulate without assistance at this point.  Continues to see Dr. Ayesha Mohair and is currently seeing a neurosurgeon.  Skin remains dry and pruritic.  There is no rash.  He is applying a moisturizing lotion directly after showering.  Pruritus of his skin seems to have an emotional component.  Past Medical History:  Diagnosis Date  . Achilles tendon rupture 2014   Left  . Complication of anesthesia    Pt had previouds experience of feeling pain after local anesthesia  . Headache(784.0)   . Hyperlipidemia   . Tuberculosis    Pt exposed and medicated  1990's    Past Surgical History:  Procedure Laterality Date  . ACHILLES TENDON SURGERY Left 02/24/2013   Procedure: LEFT ACHILLES TENDON REPAIR;  Surgeon: Cheral Almas, MD;  Location: [facility] OR;  Service: Orthopedics;  Laterality: Left;  . FRACTURE SURGERY Right     arm, metal pin    Family History  Problem Relation Age of Onset  . Cancer - Lung Mother        Mother died of lung cancer at age 55 (smoker)  . Hypertension Father        Father died in his 9s secondary to aortic aneurysm. He has history of stroke and was a smoker  . AAA (abdominal aortic aneurysm) Father   . Diverticulosis Sister   . Schizophrenia Cousin        and nephew  . Obesity Sister        Sister died at age 15 secondary to complications of morbid obesity    Social History   Socioeconomic History  .  Marital status: Single    Spouse name: Not on file  . Number of children: 0  . Years of education: Not on file  . Highest education level: Not on file  Occupational History  . Occupation: professor    Employer: [employer]  Tobacco Use  . Smoking status: Former Smoker    Packs/day: 0.25    Years: 8.00    Pack years: 2.00    Types: Cigarettes    Quit date: 12/27/2013    Years since quitting: 6.0  . Smokeless tobacco: Never Used  . Tobacco comment: pt goes in and out trying to quit  Substance and Sexual Activity  . Alcohol use: Not Currently    Comment: None since 9/20.   . Drug use: Yes    Types: Marijuana  . Sexual activity: Not on file  Other Topics Concern  . 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   One caffeinated beverage daily   Social Determinants of Health   Financial Resource Strain:   . Difficulty of Paying Living Expenses: Not on file  Food Insecurity:   . Worried About Programme researcher, broadcasting/film/video in the Last Year: Not on file  .  Ran Out of Food in the Last Year: Not on file  Transportation Needs:   . Lack of Transportation (Medical): Not on file  . Lack of Transportation (Non-Medical): Not on file  Physical Activity:   . Days of Exercise per Week: Not on file  . Minutes of Exercise per Session: Not on file  Stress:   . Feeling of Stress : Not on file  Social Connections:   . Frequency of Communication with Friends and Family: Not on file  . Frequency of Social Gatherings with Friends and Family: Not on file  . Attends Religious Services: Not on file  . Active Member of Clubs or Organizations: Not on file  . Attends Banker Meetings: Not on file  . Marital Status: Not on file  Intimate Partner Violence:   . Fear of Current or Ex-Partner: Not on file  . Emotionally Abused: Not on file  . Physically Abused: Not on file  . Sexually Abused: Not on file    Outpatient Medications Prior to Visit  Medication Sig  Dispense Refill  . rosuvastatin (CRESTOR) 20 MG tablet TAKE 1 TABLET BY MOUTH EVERY DAY 90 tablet 0   No facility-administered medications prior to visit.    No Known Allergies  ROS Review of Systems  Constitutional: Negative.   Respiratory: Negative.   Cardiovascular: Negative.   Gastrointestinal: Negative.   Musculoskeletal: Positive for back pain and myalgias.  Skin: Negative for pallor and rash.  Neurological: Negative for weakness and numbness.      Objective:    Physical Exam Vitals and nursing note reviewed.  Constitutional:      Appearance: Normal appearance.  HENT:     Right Ear: External ear normal.     Left Ear: External ear normal.  Eyes:     Extraocular Movements: Extraocular movements intact.     Conjunctiva/sclera: Conjunctivae normal.     Pupils: Pupils are equal, round, and reactive to light.  Pulmonary:     Effort: Pulmonary effort is normal.  Skin:    General: Skin is dry.  Neurological:     Mental Status: He is alert and oriented to person, place, and time.  Psychiatric:        Mood and Affect: Mood normal.        Behavior: Behavior normal.     BP 134/76   Pulse 81   Temp (!) 97.1 F (36.2 C) (Tympanic)   Ht 5\' 11"  (1.803 m)   Wt 228 lb 9.6 oz (103.7 kg)   SpO2 95%   BMI 31.88 kg/m  Wt Readings from Last 3 Encounters:  01/17/20 228 lb 9.6 oz (103.7 kg)  09/22/19 217 lb 9.6 oz (98.7 kg)  01/14/19 204 lb (92.5 kg)     Health Maintenance Due  Topic Date Due  . TETANUS/TDAP  Never done    There are no preventive care reminders to display for this patient.  Lab Results  Component Value Date   TSH 0.74 09/02/2017   Lab Results  Component Value Date   WBC 4.7 09/22/2019   HGB 14.2 09/22/2019   HCT 42.2 09/22/2019   MCV 86.8 09/22/2019   PLT 182.0 09/22/2019   Lab Results  Component Value Date   NA 140 09/22/2019   K 4.6 09/22/2019   CO2 30 09/22/2019   GLUCOSE 93 09/22/2019   BUN 13 09/22/2019   CREATININE 0.97  09/22/2019   BILITOT 0.5 09/22/2019   ALKPHOS 58 09/22/2019   AST 19 09/22/2019  ALT 28 09/22/2019   PROT 7.0 09/22/2019   ALBUMIN 4.4 09/22/2019   CALCIUM 9.6 09/22/2019   ANIONGAP 8 12/30/2018   GFR 97.20 09/22/2019   Lab Results  Component Value Date   CHOL 174 09/22/2019   Lab Results  Component Value Date   HDL 47.80 09/22/2019   Lab Results  Component Value Date   LDLCALC 111 (H) 09/22/2019   Lab Results  Component Value Date   TRIG 76.0 09/22/2019   Lab Results  Component Value Date   CHOLHDL 4 09/22/2019   Lab Results  Component Value Date   HGBA1C 5.8 (H) 12/29/2018      Assessment & Plan:   Problem List Items Addressed This Visit    None    Visit Diagnoses    Need for influenza vaccination    -  Primary   Relevant Orders   Flu Vaccine QUAD 6+ mos PF IM (Fluarix Quad PF) (Completed)   Lumbar radiculopathy       Relevant Medications   predniSONE (STERAPRED UNI-PAK 21 TAB) 10 MG (21) TBPK tablet   Eczema, unspecified type       Relevant Medications   predniSONE (STERAPRED UNI-PAK 21 TAB) 10 MG (21) TBPK tablet   triamcinolone ointment (KENALOG) 0.1 %   Xerosis cutis       Relevant Medications   predniSONE (STERAPRED UNI-PAK 21 TAB) 10 MG (21) TBPK tablet   triamcinolone ointment (KENALOG) 0.1 %      Meds ordered this encounter  Medications  . predniSONE (STERAPRED UNI-PAK 21 TAB) 10 MG (21) TBPK tablet    Sig: Take 6 today, 5 tomorrow, 4 the next day and then 3, 2, 1 and stop    Dispense:  21 tablet    Refill:  0  . triamcinolone ointment (KENALOG) 0.1 %    Sig: Apply to itchy spots twice daily as needed. Not for face or private areas.    Dispense:  453.6 g    Refill:  0    Follow-up: Return if symptoms worsen or fail to improve, for and Follow up with Dr. Katrinka Blazing. . Entirely supported his decision to seek a second opinion.  Advised him to see Dr. Katrinka Blazing Will try a 6-day Dosepak to see if he can help his back as well as his skin.  Reminded  him of basic dry skin care.  Minimize time in the shower and use gentle soaps like Dove bar A.  Apply lubricating moisturizers directly after towel drying.  Will use 0.1% triamcinolone ointment to the particularly worrisome areas.  Not for the skin of the face or private areas.  Mliss Sax, MD

## 2020-01-17 NOTE — Patient Instructions (Signed)
Access Code: [email or web address] URL: [email or web address] Date: 01/17/2020 Prepared by: Stacie Glaze  Exercises Supine Hamstring Stretch with Strap - 2 x daily - 7 x weekly - 1 sets - 5 reps - 30 hold Supine Lower Trunk Rotation - 2 x daily - 7 x weekly - 1 sets - 5 reps - 30 hold Supine Single Knee to Chest Stretch - 2 x daily - 7 x weekly - 1 sets - 5 reps - 30 hold Supine ITB Stretch with Strap - 2 x daily - 7 x weekly - 1 sets - 5 reps - 30 hold Self Traction in Standing with Counter Top - 2 x daily - 7 x weekly - 1 sets - 3 reps - 30 hold Self Traction Sitting - 2 x daily - 7 x weekly - 1 sets - 3 reps - 30 hold

## 2020-01-17 NOTE — Patient Instructions (Signed)
Prednisone tablets What is this medicine? PREDNISONE (PRED ni sone) is a corticosteroid. It is commonly used to treat inflammation of the skin, joints, lungs, and other organs. Common conditions treated include asthma, allergies, and arthritis. It is also used for other conditions, such as blood disorders and diseases of the adrenal glands. This medicine may be used for other purposes; ask your health care provider or pharmacist if you have questions. COMMON BRAND NAME(S): Deltasone, Predone, Sterapred, Sterapred DS What should I tell my health care provider before I take this medicine? They need to know if you have any of these conditions:  Cushing's syndrome  diabetes  glaucoma  heart disease  high blood pressure  infection (especially a virus infection such as chickenpox, cold sores, or herpes)  kidney disease  liver disease  mental illness  myasthenia gravis  osteoporosis  seizures  stomach or intestine problems  thyroid disease  an unusual or allergic reaction to lactose, prednisone, other medicines, foods, dyes, or preservatives  pregnant or trying to get pregnant  breast-feeding How should I use this medicine? Take this medicine by mouth with a glass of water. Follow the directions on the prescription label. Take this medicine with food. If you are taking this medicine once a day, take it in the morning. Do not take more medicine than you are told to take. Do not suddenly stop taking your medicine because you may develop a severe reaction. Your doctor will tell you how much medicine to take. If your doctor wants you to stop the medicine, the dose may be slowly lowered over time to avoid any side effects. Talk to your pediatrician regarding the use of this medicine in children. Special care may be needed. Overdosage: If you think you have taken too much of this medicine contact a poison control center or emergency room at once. NOTE: This medicine is only for you.  Do not share this medicine with others. What if I miss a dose? If you miss a dose, take it as soon as you can. If it is almost time for your next dose, talk to your doctor or health care professional. You may need to miss a dose or take an extra dose. Do not take double or extra doses without advice. What may interact with this medicine? Do not take this medicine with any of the following medications:  metyrapone  mifepristone This medicine may also interact with the following medications:  aminoglutethimide  amphotericin B  aspirin and aspirin-like medicines  barbiturates  certain medicines for diabetes, like glipizide or glyburide  cholestyramine  cholinesterase inhibitors  cyclosporine  digoxin  diuretics  ephedrine  male hormones, like estrogens and birth control pills  isoniazid  ketoconazole  NSAIDS, medicines for pain and inflammation, like ibuprofen or naproxen  phenytoin  rifampin  toxoids  vaccines  warfarin This list may not describe all possible interactions. Give your health care provider a list of all the medicines, herbs, non-prescription drugs, or dietary supplements you use. Also tell them if you smoke, drink alcohol, or use illegal drugs. Some items may interact with your medicine. What should I watch for while using this medicine? Visit your doctor or health care professional for regular checks on your progress. If you are taking this medicine over a prolonged period, carry an identification card with your name and address, the type and dose of your medicine, and your doctor's name and address. This medicine may increase your risk of getting an infection. Tell your doctor or   health care professional if you are around anyone with measles or chickenpox, or if you develop sores or blisters that do not heal properly. If you are going to have surgery, tell your doctor or health care professional that you have taken this medicine within the last  twelve months. Ask your doctor or health care professional about your diet. You may need to lower the amount of salt you eat. This medicine may increase blood sugar. Ask your healthcare provider if changes in diet or medicines are needed if you have diabetes. What side effects may I notice from receiving this medicine? Side effects that you should report to your doctor or health care professional as soon as possible:  allergic reactions like skin rash, itching or hives, swelling of the face, lips, or tongue  changes in emotions or moods  changes in vision  depressed mood  eye pain  fever or chills, cough, sore throat, pain or difficulty passing urine  signs and symptoms of high blood sugar such as being more thirsty or hungry or having to urinate more than normal. You may also feel very tired or have blurry vision.  swelling of ankles, feet Side effects that usually do not require medical attention (report to your doctor or health care professional if they continue or are bothersome):  confusion, excitement, restlessness  headache  nausea, vomiting  skin problems, acne, thin and shiny skin  trouble sleeping  weight gain This list may not describe all possible side effects. Call your doctor for medical advice about side effects. You may report side effects to FDA at 1-800-FDA-1088. Where should I keep my medicine? Keep out of the reach of children. Store at room temperature between 15 and 30 degrees C (59 and 86 degrees F). Protect from light. Keep container tightly closed. Throw away any unused medicine after the expiration date. NOTE: This sheet is a summary. It may not cover all possible information. If you have questions about this medicine, talk to your doctor, pharmacist, or health care provider.  2020 Elsevier/Gold Standard (2017-12-16 10:54:22)  

## 2020-01-17 NOTE — Therapy (Signed)
[facility] [address] [place], [place], [address] Phone: [phone]   Fax:  [phone]  Physical Therapy Evaluation  Patient Details  Name: Ray Case MRN: [id number] Date of Birth: 1965/04/25 Referring Provider (PT): Terrilee Files   Encounter Date: 01/17/2020   PT End of Session - 01/17/20 1159    Visit Number 1    Date for PT Re-Evaluation 03/18/20    PT Start Time 0929    PT Stop Time 1014    PT Time Calculation (min) 45 min    Activity Tolerance Patient tolerated treatment well    Behavior During Therapy [facility] for tasks assessed/performed           Past Medical History:  Diagnosis Date   Achilles tendon rupture 2014   Left   Complication of anesthesia    Pt had previouds experience of feeling pain after local anesthesia   Headache(784.0)    Hyperlipidemia    Tuberculosis    Pt exposed and medicated  1990's    Past Surgical History:  Procedure Laterality Date   ACHILLES TENDON SURGERY Left 02/24/2013   Procedure: LEFT ACHILLES TENDON REPAIR;  Surgeon: Cheral Almas, MD;  Location: [facility] OR;  Service: Orthopedics;  Laterality: Left;   FRACTURE SURGERY Right     arm, metal pin    There were no vitals filed for this visit.    Subjective Assessment - 01/17/20 0938    Subjective Patient reports that he has had some low back pain for 10+ years, he was a Forensic psychologist.  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  He was seen by me for a few visits via internet during covid.  He had an injection that did not help.  He feels that maybe the pain is worse due to more sitting with his job.    Pertinent History achilles surgery    Limitations Sitting;Standing;Walking    How long can you stand comfortably? 2-3 minutes    How long can you walk comfortably? 200 feet then pain really hurts    Patient Stated Goals would like to have less pain and would  like to walk more    Currently in Pain? Yes    Pain Score 0-No pain    Pain Location Back    Pain Orientation Lower;Right    Pain Descriptors / Indicators Aching;Tightness    Pain Type Chronic pain    Pain Radiating Towards some pain into the right buttock at times    Pain Onset More than a month ago    Pain Frequency Intermittent    Aggravating Factors  standing, walking 2-3 minutes pain up to 9-10/10    Pain Relieving Factors has a brace to help with walking and standing, reports lying down or sitting will help pain can be 0/10    Effect of Pain on Daily Activities standing and walking              [facility] PT Assessment - 01/17/20 0001      Assessment   Medical Diagnosis LBP    Referring Provider (PT) Terrilee Files    Onset Date/Surgical Date 12/18/19    Prior Therapy a few telehealth visits      Precautions   Precautions None      Balance Screen   Has the patient fallen in the past 6 months No    Has the patient had a decrease in activity level because  of a fear of falling?  No    Is the patient reluctant to leave their home because of a fear of falling?  No      Home Environment   Additional Comments does some housework      Prior Function   Level of Independence Independent    Vocation Full time employment    Vocation Requirements professor, sitting, some dance    Leisure yoga      Posture/Postural Control   Posture Comments some slouched and rounded shoulder posture      AROM   Overall AROM Comments Lumbar ROM was decreased 50% with c/o tightness      Flexibility   Hamstrings mild tightness    Quadriceps mild tightness    ITB very tight                      Objective measurements completed on examination: See above findings.                 PT Short Term Goals - 01/17/20 1202      PT SHORT TERM GOAL #1   Title independent with intiial HEP    Time 2    Period Weeks    Status New             PT Long Term Goals - 01/17/20  1203      PT LONG TERM GOAL #1   Title understand posture and body mechanics    Time 8    Period Weeks    Status New      PT LONG TERM GOAL #2   Title report able to walk 1 mile with 50% less pain    Time 8    Period Weeks    Status New      PT LONG TERM GOAL #3   Title report less pain with sitting 50%    Time 8    Period Weeks    Status New      PT LONG TERM GOAL #4   Title independent with advanced HEP or gym setting    Time 8    Period Weeks    Status New                  Plan - 01/17/20 1200    Clinical Impression Statement Patient has had some LBP for over 10 years, he was a Forensic psychologist and MRI shows impingement, DDD, stenosis with anterolisthesis.  He has difficulty now standing and walking for greater than 2-3 minutes, he can walk longer if he wears a brace.  He reports that he feels over the past 16 years he switched to more sitting at his job as a professor at [employer]. Has some weakness of the core and has tightness in the LE's.    Stability/Clinical Decision Making Stable/Uncomplicated    Clinical Decision Making Moderate    Rehab Potential Good    PT Frequency 2x / week    PT Duration 8 weeks    PT Treatment/Interventions ADLs/Self Care Home Management;Electrical Stimulation;Moist Heat;Traction;Functional mobility training;Therapeutic activities;Therapeutic exercise;Neuromuscular re-education;Manual techniques;Dry needling;Patient/family education    PT Next Visit Plan will need core strength, flexibility and may try traction    Consulted and Agree with Plan of Care Patient           Patient will benefit from skilled therapeutic intervention in order to improve the following deficits and impairments:  Difficulty walking, Impaired flexibility, Postural dysfunction, Pain, Improper  body mechanics, Increased muscle spasms, Decreased range of motion  Visit Diagnosis: Chronic bilateral low back pain without sciatica - Plan: PT plan of care  cert/re-cert     Problem List Patient Active Problem List   Diagnosis Date Noted   Lactic acidosis 12/29/2018   Alcohol use disorder, moderate, in early remission (HCC) 12/29/2018   Alcohol withdrawal (HCC) 12/29/2018   Nausea, vomiting and diarrhea 12/28/2018   SOB (shortness of breath) 12/28/2018   Chest pain 12/28/2018   Hypokalemia 12/28/2018   Hypomagnesemia 12/28/2018   Hamstring tendinitis of left thigh 07/01/2018   Nonallopathic lesion of lumbosacral region 12/03/2017   Nonallopathic lesion of rib cage 12/03/2017   Elevated LDL cholesterol level 06/13/2017   Cervical radiculopathy 06/13/2017   Right elbow pain 06/13/2017   Tibialis posterior tendinitis, right 11/27/2016   Hip abductor tendinitis 11/27/2016   Encounter for health maintenance examination with abnormal findings 07/13/2014   Chronic neck pain 07/12/2014   Nonallopathic lesion of cervical region 07/12/2014   Nonallopathic lesion of thoracic region 07/12/2014   Nonallopathic lesion of sacral region 07/12/2014   Chronic back pain 06/15/2014   Insomnia 05/24/2013   Achilles tendon rupture 02/24/2013   LOW BACK PAIN, CHRONIC 11/03/2008   HERPES, GENITAL NOS 01/16/2007   GAD (generalized anxiety disorder) 01/16/2007   DEPRESSION 01/16/2007   ALLERGIC RHINITIS 01/16/2007    Jearld Lesch., PT 01/17/2020, 12:05 PM  [facility] [address] [place], [place], [address] Phone: [phone]   Fax:  [phone]  Name: Ray Case MRN: [id number] Date of Birth: 07-Apr-1965

## 2020-01-19 ENCOUNTER — Ambulatory Visit: Payer: BC Managed Care – PPO | Admitting: Physical Therapy

## 2020-01-19 ENCOUNTER — Encounter: Payer: Self-pay | Admitting: Physical Therapy

## 2020-01-19 ENCOUNTER — Other Ambulatory Visit: Payer: Self-pay

## 2020-01-19 DIAGNOSIS — G8929 Other chronic pain: Secondary | ICD-10-CM

## 2020-01-19 DIAGNOSIS — M545 Low back pain, unspecified: Secondary | ICD-10-CM | POA: Diagnosis not present

## 2020-01-19 NOTE — Therapy (Signed)
[place]. [place], [place], [address] Phone: [phone]   Fax:  [phone]  Physical Therapy Treatment  Patient Details  Name: Ray Case MRN: [id number] Date of Birth: 11/07/1965 Referring Provider (PT): Charlann Boxer   Encounter Date: 01/19/2020   PT End of Session - 01/19/20 1419    Visit Number 2    Date for PT Re-Evaluation 03/18/20    PT Start Time 1345    PT Stop Time 1430    PT Time Calculation (min) 45 min    Activity Tolerance Patient tolerated treatment well    Behavior During Therapy [facility] for tasks assessed/performed           Past Medical History:  Diagnosis Date  . Achilles tendon rupture 2014   Left  . Complication of anesthesia    Pt had previouds experience of feeling pain after local anesthesia  . Headache(784.0)   . Hyperlipidemia   . Tuberculosis    Pt exposed and medicated  1990's    Past Surgical History:  Procedure Laterality Date  . ACHILLES TENDON SURGERY Left 02/24/2013   Procedure: LEFT ACHILLES TENDON REPAIR;  Surgeon: Marianna Payment, MD;  Location: [facility];  Service: Orthopedics;  Laterality: Left;  . FRACTURE SURGERY Right     arm, metal pin    There were no vitals filed for this visit.   Subjective Assessment - 01/19/20 1345    Subjective Pt reports that he started prednisone Tuesday that he got from MD and he is feeling better.    Currently in Pain? No/denies    Pain Score 1     Pain Location Back                             [facility] Adult PT Treatment/Exercise - 01/19/20 0001      Exercises   Exercises Lumbar      Lumbar Exercises: Aerobic   Nustep L4 x 6 min       Lumbar Exercises: Machines for Strengthening   Other Lumbar Machine Exercise Rows & Lats 20lb 2x10     Other Lumbar Machine Exercise Shoulder Ext 10lb 2x10       Lumbar Exercises: Standing   Other Standing Lumbar Exercises AR presses 20lb x 10 each side      Modalities    Modalities Traction      Traction   Type of Traction Lumbar    Min (lbs) 70    Max (lbs) 80    Hold Time 60    Rest Time 20    Time 12                    PT Short Term Goals - 01/19/20 1419      PT SHORT TERM GOAL #1   Title independent with intiial HEP    Status Achieved             PT Long Term Goals - 01/19/20 1419      PT LONG TERM GOAL #1   Title understand posture and body mechanics    Status Partially Met      PT LONG TERM GOAL #2   Title report able to walk 1 mile with 50% less pain    Status On-going      PT LONG TERM GOAL #3   Title report less pain with sitting 50%    Status On-going  Plan - 01/19/20 1420    Clinical Impression Statement Pt reports less lain overall since starting prednisone Tuesday. He did well with an introduction to postural strengthening. Some core weakness noted with shoulder extensions. Core weakness also noted with anti rotational presses. No issues reported with traction.    Stability/Clinical Decision Making Stable/Uncomplicated    Rehab Potential Good    PT Frequency 2x / week    PT Duration 8 weeks    PT Treatment/Interventions ADLs/Self Care Home Management;Electrical Stimulation;Moist Heat;Traction;Functional mobility training;Therapeutic activities;Therapeutic exercise;Neuromuscular re-education;Manual techniques;Dry needling;Patient/family education    PT Next Visit Plan will need core strength, flexibility and assess traction           Patient will benefit from skilled therapeutic intervention in order to improve the following deficits and impairments:  Difficulty walking, Impaired flexibility, Postural dysfunction, Pain, Improper body mechanics, Increased muscle spasms, Decreased range of motion  Visit Diagnosis: Chronic bilateral low back pain without sciatica     Problem List Patient Active Problem List   Diagnosis Date Noted  . Lactic acidosis 12/29/2018  . Alcohol use  disorder, moderate, in early remission ([place]) 12/29/2018  . Alcohol withdrawal ([place]) 12/29/2018  . Nausea, vomiting and diarrhea 12/28/2018  . SOB (shortness of breath) 12/28/2018  . Chest pain 12/28/2018  . Hypokalemia 12/28/2018  . Hypomagnesemia 12/28/2018  . Hamstring tendinitis of left thigh 07/01/2018  . Nonallopathic lesion of lumbosacral region 12/03/2017  . Nonallopathic lesion of rib cage 12/03/2017  . Elevated LDL cholesterol level 06/13/2017  . Cervical radiculopathy 06/13/2017  . Right elbow pain 06/13/2017  . Tibialis posterior tendinitis, right 11/27/2016  . Hip abductor tendinitis 11/27/2016  . Encounter for health maintenance examination with abnormal findings 07/13/2014  . Chronic neck pain 07/12/2014  . Nonallopathic lesion of cervical region 07/12/2014  . Nonallopathic lesion of thoracic region 07/12/2014  . Nonallopathic lesion of sacral region 07/12/2014  . Chronic back pain 06/15/2014  . Insomnia 05/24/2013  . Achilles tendon rupture 02/24/2013  . LOW BACK PAIN, CHRONIC 11/03/2008  . HERPES, GENITAL NOS 01/16/2007  . GAD (generalized anxiety disorder) 01/16/2007  . DEPRESSION 01/16/2007  . ALLERGIC RHINITIS 01/16/2007    Scot Jun 01/19/2020, 2:24 PM  [place]. [place], [place], [address] Phone: [phone]   Fax:  [phone]  Name: Ray Case MRN: [id number] Date of Birth: [birth year]

## 2020-01-22 ENCOUNTER — Encounter: Payer: Self-pay | Admitting: Family Medicine

## 2020-01-22 ENCOUNTER — Other Ambulatory Visit: Payer: Self-pay | Admitting: Family Medicine

## 2020-01-22 DIAGNOSIS — L853 Xerosis cutis: Secondary | ICD-10-CM

## 2020-01-22 DIAGNOSIS — M5416 Radiculopathy, lumbar region: Secondary | ICD-10-CM

## 2020-01-22 DIAGNOSIS — L309 Dermatitis, unspecified: Secondary | ICD-10-CM

## 2020-01-24 ENCOUNTER — Other Ambulatory Visit: Payer: Self-pay

## 2020-01-24 ENCOUNTER — Ambulatory Visit: Payer: BC Managed Care – PPO | Admitting: Physical Therapy

## 2020-01-24 ENCOUNTER — Encounter: Payer: Self-pay | Admitting: Physical Therapy

## 2020-01-24 DIAGNOSIS — G8929 Other chronic pain: Secondary | ICD-10-CM

## 2020-01-24 DIAGNOSIS — M545 Low back pain, unspecified: Secondary | ICD-10-CM | POA: Diagnosis not present

## 2020-01-24 NOTE — Telephone Encounter (Signed)
Follow up with Dr. Katrinka Blazing would be the best route.

## 2020-01-24 NOTE — Therapy (Signed)
[place]. [place], [place], [address] Phone: [phone]   Fax:  [phone]  Physical Therapy Treatment  Patient Details  Name: Ray Case MRN: [id number] Date of Birth: [birth year] Referring Provider (PT): Charlann Boxer   Encounter Date: 01/24/2020   PT End of Session - 01/24/20 1003    Visit Number 6    Date for PT Re-Evaluation 03/18/20    PT Start Time 0928    PT Stop Time 1015    PT Time Calculation (min) 47 min    Activity Tolerance Patient tolerated treatment well    Behavior During Therapy [facility] for tasks assessed/performed           Past Medical History:  Diagnosis Date  . Achilles tendon rupture 2014   Left  . Complication of anesthesia    Pt had previouds experience of feeling pain after local anesthesia  . Headache(784.0)   . Hyperlipidemia   . Tuberculosis    Pt exposed and medicated  1990's    Past Surgical History:  Procedure Laterality Date  . ACHILLES TENDON SURGERY Left 02/24/2013   Procedure: LEFT ACHILLES TENDON REPAIR;  Surgeon: Marianna Payment, MD;  Location: [facility];  Service: Orthopedics;  Laterality: Left;  . FRACTURE SURGERY Right     arm, metal pin    There were no vitals filed for this visit.   Subjective Assessment - 01/24/20 0930    Subjective Back feels ok but belt better at the beginning of the week, just did 50 minutes on exercise bike,  Prednisone ended yesterday    Currently in Pain? Yes    Pain Score 1     Pain Location Back                             [facility] Adult PT Treatment/Exercise - 01/24/20 0001      Lumbar Exercises: Aerobic   UBE (Upper Arm Bike) L1 x3 min eaxh       Lumbar Exercises: Machines for Strengthening   Cybex Lumbar Extension black band 2x15     Other Lumbar Machine Exercise Rows & Lats 25lb 2x10       Lumbar Exercises: Standing   Other Standing Lumbar Exercises AR presses 20lb x 10 each side      Traction   Type  of Traction Lumbar    Min (lbs) 80    Max (lbs) 90    Hold Time 60    Rest Time 20    Time 12                    PT Short Term Goals - 01/19/20 1419      PT SHORT TERM GOAL #1   Title independent with intiial HEP    Status Achieved             PT Long Term Goals - 01/19/20 1419      PT LONG TERM GOAL #1   Title understand posture and body mechanics    Status Partially Met      PT LONG TERM GOAL #2   Title report able to walk 1 mile with 50% less pain    Status On-going      PT LONG TERM GOAL #3   Title report less pain with sitting 50%    Status On-going  Plan - 01/24/20 1005    Clinical Impression Statement Pt again reports less pain in his back overall. Increase resistance tolerated with seated rows and lats, Resisted lumbar extensions added as well. Some L knee discomfort reported with sit to stands. Pt reports that his knees naturally turn out from all the years of dancing, so keeping them straight causes some discomfort. Increase pull tolerated with traction.    Stability/Clinical Decision Making Stable/Uncomplicated    Rehab Potential Good    PT Frequency 2x / week    PT Duration 8 weeks    PT Treatment/Interventions ADLs/Self Care Home Management;Electrical Stimulation;Moist Heat;Traction;Functional mobility training;Therapeutic activities;Therapeutic exercise;Neuromuscular re-education;Manual techniques;Dry needling;Patient/family education    PT Next Visit Plan will need core strength, flexibility and assess traction           Patient will benefit from skilled therapeutic intervention in order to improve the following deficits and impairments:  Difficulty walking, Impaired flexibility, Postural dysfunction, Pain, Improper body mechanics, Increased muscle spasms, Decreased range of motion  Visit Diagnosis: Chronic bilateral low back pain without sciatica     Problem List Patient Active Problem List   Diagnosis Date  Noted  . Lactic acidosis 12/29/2018  . Alcohol use disorder, moderate, in early remission ([place]) 12/29/2018  . Alcohol withdrawal ([place]) 12/29/2018  . Nausea, vomiting and diarrhea 12/28/2018  . SOB (shortness of breath) 12/28/2018  . Chest pain 12/28/2018  . Hypokalemia 12/28/2018  . Hypomagnesemia 12/28/2018  . Hamstring tendinitis of left thigh 07/01/2018  . Nonallopathic lesion of lumbosacral region 12/03/2017  . Nonallopathic lesion of rib cage 12/03/2017  . Elevated LDL cholesterol level 06/13/2017  . Cervical radiculopathy 06/13/2017  . Right elbow pain 06/13/2017  . Tibialis posterior tendinitis, right 11/27/2016  . Hip abductor tendinitis 11/27/2016  . Encounter for health maintenance examination with abnormal findings 07/13/2014  . Chronic neck pain 07/12/2014  . Nonallopathic lesion of cervical region 07/12/2014  . Nonallopathic lesion of thoracic region 07/12/2014  . Nonallopathic lesion of sacral region 07/12/2014  . Chronic back pain 06/15/2014  . Insomnia 05/24/2013  . Achilles tendon rupture 02/24/2013  . LOW BACK PAIN, CHRONIC 11/03/2008  . HERPES, GENITAL NOS 01/16/2007  . GAD (generalized anxiety disorder) 01/16/2007  . DEPRESSION 01/16/2007  . ALLERGIC RHINITIS 01/16/2007    Scot Jun, PTA 01/24/2020, 10:07 AM  [place]. [place], [place], [address] Phone: [phone]   Fax:  [phone]  Name: Ray Case MRN: [id number] Date of Birth: 25-Aug-1965

## 2020-01-24 NOTE — Telephone Encounter (Signed)
Please see message . Thank you .

## 2020-01-26 ENCOUNTER — Encounter: Payer: Self-pay | Admitting: Physical Therapy

## 2020-01-26 ENCOUNTER — Other Ambulatory Visit: Payer: Self-pay

## 2020-01-26 ENCOUNTER — Ambulatory Visit: Payer: BC Managed Care – PPO | Admitting: Physical Therapy

## 2020-01-26 DIAGNOSIS — G8929 Other chronic pain: Secondary | ICD-10-CM

## 2020-01-26 DIAGNOSIS — M545 Low back pain, unspecified: Secondary | ICD-10-CM | POA: Diagnosis not present

## 2020-01-26 NOTE — Therapy (Signed)
[address]. [place], [place], [address] Phone: [phone]   Fax:  [phone]  Physical Therapy Treatment  Patient Details  Name: Ray Case MRN: [id number] Date of Birth: April 24, 1965 Referring Provider (PT): Charlann Boxer   Encounter Date: 01/26/2020   PT End of Session - 01/26/20 1014    Visit Number 7    Date for PT Re-Evaluation 03/18/20    PT Start Time 0930    PT Stop Time 1019    PT Time Calculation (min) 49 min    Activity Tolerance Patient tolerated treatment well    Behavior During Therapy [facility] for tasks assessed/performed           Past Medical History:  Diagnosis Date  . Achilles tendon rupture 2014   Left  . Complication of anesthesia    Pt had previouds experience of feeling pain after local anesthesia  . Headache(784.0)   . Hyperlipidemia   . Tuberculosis    Pt exposed and medicated  1990's    Past Surgical History:  Procedure Laterality Date  . ACHILLES TENDON SURGERY Left 02/24/2013   Procedure: LEFT ACHILLES TENDON REPAIR;  Surgeon: Marianna Payment, MD;  Location: [facility];  Service: Orthopedics;  Laterality: Left;  . FRACTURE SURGERY Right     arm, metal pin    There were no vitals filed for this visit.   Subjective Assessment - 01/26/20 0929    Subjective Reports to PT with mild back pain and had to take an aleve this morning. He said that he feels his back symptoms arising again now that he is no longer on prednisone. No c/o pain in his knee.    Currently in Pain? Yes    Pain Score 2                              [facility] Adult PT Treatment/Exercise - 01/26/20 0001      Lumbar Exercises: Stretches   Other Lumbar Stretch Exercise 3 way ball roll-outs, mulltifidus walk-outs #20 x5, T-spine ext. on foam roller    Other Lumbar Stretch Exercise standing psoas stretch 3x15" ea side      Lumbar Exercises: Aerobic   UBE (Upper Arm Bike) L3 x3 mins ea way       Lumbar Exercises: Machines for Strengthening   Other Lumbar Machine Exercise Rows #25 2x10      Lumbar Exercises: Standing   Other Standing Lumbar Exercises alt. standing marches with ISO shoulder Ext. 2x10      Traction   Type of Traction Lumbar    Min (lbs) 80    Max (lbs) 90    Hold Time 60    Rest Time 20    Time 12                    PT Short Term Goals - 01/19/20 1419      PT SHORT TERM GOAL #1   Title independent with intiial HEP    Status Achieved             PT Long Term Goals - 01/19/20 1419      PT LONG TERM GOAL #1   Title understand posture and body mechanics    Status Partially Met      PT LONG TERM GOAL #2   Title report able to walk 1 mile with 50% less pain    Status On-going  PT LONG TERM GOAL #3   Title report less pain with sitting 50%    Status On-going                 Plan - 01/26/20 1020    Clinical Impression Statement Pt reports increased pain during lumbar extension exercises and decreased pain with flexion. Responded well to deep lumbar stabilization exercises at PT today. Patient was very aware of his posture and maintained correct technique throughout exercises. Responds well to traction.    PT Treatment/Interventions ADLs/Self Care Home Management;Electrical Stimulation;Moist Heat;Traction;Functional mobility training;Therapeutic activities;Therapeutic exercise;Neuromuscular re-education;Manual techniques;Dry needling;Patient/family education    PT Next Visit Plan Emphasize hip flexor stretches, core stability and continue with traction as needed.           Patient will benefit from skilled therapeutic intervention in order to improve the following deficits and impairments:  Difficulty walking, Impaired flexibility, Postural dysfunction, Pain, Improper body mechanics, Increased muscle spasms, Decreased range of motion  Visit Diagnosis: Chronic bilateral low back pain without sciatica     Problem  List Patient Active Problem List   Diagnosis Date Noted  . Lactic acidosis 12/29/2018  . Alcohol use disorder, moderate, in early remission ([place]) 12/29/2018  . Alcohol withdrawal ([place]) 12/29/2018  . Nausea, vomiting and diarrhea 12/28/2018  . SOB (shortness of breath) 12/28/2018  . Chest pain 12/28/2018  . Hypokalemia 12/28/2018  . Hypomagnesemia 12/28/2018  . Hamstring tendinitis of left thigh 07/01/2018  . Nonallopathic lesion of lumbosacral region 12/03/2017  . Nonallopathic lesion of rib cage 12/03/2017  . Elevated LDL cholesterol level 06/13/2017  . Cervical radiculopathy 06/13/2017  . Right elbow pain 06/13/2017  . Tibialis posterior tendinitis, right 11/27/2016  . Hip abductor tendinitis 11/27/2016  . Encounter for health maintenance examination with abnormal findings 07/13/2014  . Chronic neck pain 07/12/2014  . Nonallopathic lesion of cervical region 07/12/2014  . Nonallopathic lesion of thoracic region 07/12/2014  . Nonallopathic lesion of sacral region 07/12/2014  . Chronic back pain 06/15/2014  . Insomnia 05/24/2013  . Achilles tendon rupture 02/24/2013  . LOW BACK PAIN, CHRONIC 11/03/2008  . HERPES, GENITAL NOS 01/16/2007  . GAD (generalized anxiety disorder) 01/16/2007  . DEPRESSION 01/16/2007  . ALLERGIC RHINITIS 01/16/2007    Lavenia Atlas, SPTA 01/26/2020, 10:30 AM  [address]. [place], [place], [address] Phone: [phone]   Fax:  [phone]  Name: Ray Case MRN: [id number] Date of Birth: 1965-10-07

## 2020-01-27 ENCOUNTER — Other Ambulatory Visit: Payer: Self-pay

## 2020-01-27 MED ORDER — CELECOXIB 100 MG PO CAPS: 100.0000 mg | ORAL_CAPSULE | Freq: Two times a day (BID) | ORAL | 3 refills | Status: AC

## 2020-01-31 ENCOUNTER — Encounter: Payer: Self-pay | Admitting: Physical Therapy

## 2020-01-31 ENCOUNTER — Ambulatory Visit: Payer: BC Managed Care – PPO | Attending: Family Medicine | Admitting: Physical Therapy

## 2020-01-31 ENCOUNTER — Other Ambulatory Visit: Payer: Self-pay

## 2020-01-31 DIAGNOSIS — G8929 Other chronic pain: Secondary | ICD-10-CM | POA: Diagnosis present

## 2020-01-31 DIAGNOSIS — M545 Low back pain, unspecified: Secondary | ICD-10-CM | POA: Insufficient documentation

## 2020-01-31 NOTE — Therapy (Signed)
[place]. [place], [place], [address] Phone: [phone]   Fax:  [phone]  Physical Therapy Treatment  Patient Details  Name: Ray Case MRN: [id number] Date of Birth: [birth year] Referring Provider (PT): Charlann Boxer   Encounter Date: 01/31/2020   PT End of Session - 01/31/20 1011    Visit Number 8    Date for PT Re-Evaluation 03/18/20    PT Start Time 0933    PT Stop Time 1014    PT Time Calculation (min) 41 min    Activity Tolerance Patient tolerated treatment well    Behavior During Therapy [facility] for tasks assessed/performed           Past Medical History:  Diagnosis Date  . Achilles tendon rupture 2014   Left  . Complication of anesthesia    Pt had previouds experience of feeling pain after local anesthesia  . Headache(784.0)   . Hyperlipidemia   . Tuberculosis    Pt exposed and medicated  1990's    Past Surgical History:  Procedure Laterality Date  . ACHILLES TENDON SURGERY Left 02/24/2013   Procedure: LEFT ACHILLES TENDON REPAIR;  Surgeon: Marianna Payment, MD;  Location: [facility];  Service: Orthopedics;  Laterality: Left;  . FRACTURE SURGERY Right     arm, metal pin    There were no vitals filed for this visit.   Subjective Assessment - 01/31/20 0939    Subjective Feels good today. Reported working on his stress reduction and started taking celebrex and has been receiving great benefits from it.    Pain Score 0-No pain                             [facility] Adult PT Treatment/Exercise - 01/31/20 0001      Lumbar Exercises: Stretches   Hip Flexor Stretch 1 rep;60 seconds;Right;Left   Thomas str. PNF- hold relax     Lumbar Exercises: Aerobic   Tread Mill 2.4 85mns   patient reports feeling lumbar discomfort around 3:30    UBE (Upper Arm Bike) L3 332m ea way      Lumbar Exercises: Machines for Strengthening   Cybex Knee Extension #35 2x10    Cybex Knee Flexion #35 2x10       Lumbar Exercises: Standing   Other Standing Lumbar Exercises Theraband hip clock Green x5 ea side, multifidus walk-outs #20 3 laps ea side     Other Standing Lumbar Exercises alt. standing marches with ISO shoulder Ext. 2x10                     PT Short Term Goals - 01/19/20 1419      PT SHORT TERM GOAL #1   Title independent with intiial HEP    Status Achieved             PT Long Term Goals - 01/19/20 1419      PT LONG TERM GOAL #1   Title understand posture and body mechanics    Status Partially Met      PT LONG TERM GOAL #2   Title report able to walk 1 mile with 50% less pain    Status On-going      PT LONG TERM GOAL #3   Title report less pain with sitting 50%    Status On-going  Plan - 01/31/20 1012    Clinical Impression Statement Pain had increased pain symptoms in his lumbar spine during treadmill walking after 3 minutes discontinued exercise because of 2/10 pain that was not present before walking. Patient was very tight in bil hip flexors and quads. He responded well to LE strengthening exercises as well as continued lumbar stabilization exercises. Required cuing for proper techniqie of exercises throughout treatment.    PT Treatment/Interventions ADLs/Self Care Home Management;Electrical Stimulation;Moist Heat;Traction;Functional mobility training;Therapeutic activities;Therapeutic exercise;Neuromuscular re-education;Manual techniques;Dry needling;Patient/family education    PT Next Visit Plan Core stability, posterior chain strengthening and traction as indicated.           Patient will benefit from skilled therapeutic intervention in order to improve the following deficits and impairments:  Difficulty walking, Impaired flexibility, Postural dysfunction, Pain, Improper body mechanics, Increased muscle spasms, Decreased range of motion  Visit Diagnosis: Chronic bilateral low back pain without sciatica     Problem  List Patient Active Problem List   Diagnosis Date Noted  . Lactic acidosis 12/29/2018  . Alcohol use disorder, moderate, in early remission ([place]) 12/29/2018  . Alcohol withdrawal ([place]) 12/29/2018  . Nausea, vomiting and diarrhea 12/28/2018  . SOB (shortness of breath) 12/28/2018  . Chest pain 12/28/2018  . Hypokalemia 12/28/2018  . Hypomagnesemia 12/28/2018  . Hamstring tendinitis of left thigh 07/01/2018  . Nonallopathic lesion of lumbosacral region 12/03/2017  . Nonallopathic lesion of rib cage 12/03/2017  . Elevated LDL cholesterol level 06/13/2017  . Cervical radiculopathy 06/13/2017  . Right elbow pain 06/13/2017  . Tibialis posterior tendinitis, right 11/27/2016  . Hip abductor tendinitis 11/27/2016  . Encounter for health maintenance examination with abnormal findings 07/13/2014  . Chronic neck pain 07/12/2014  . Nonallopathic lesion of cervical region 07/12/2014  . Nonallopathic lesion of thoracic region 07/12/2014  . Nonallopathic lesion of sacral region 07/12/2014  . Chronic back pain 06/15/2014  . Insomnia 05/24/2013  . Achilles tendon rupture 02/24/2013  . LOW BACK PAIN, CHRONIC 11/03/2008  . HERPES, GENITAL NOS 01/16/2007  . GAD (generalized anxiety disorder) 01/16/2007  . DEPRESSION 01/16/2007  . ALLERGIC RHINITIS 01/16/2007    Lavenia Atlas, SPTA 01/31/2020, 10:20 AM  [place]. [place], [place], [address] Phone: [phone]   Fax:  [phone]  Name: Drevin Ortner MRN: [id number] Date of Birth: 10/09/1965

## 2020-02-02 ENCOUNTER — Encounter: Payer: Self-pay | Admitting: Physical Therapy

## 2020-02-02 ENCOUNTER — Other Ambulatory Visit: Payer: Self-pay

## 2020-02-02 ENCOUNTER — Ambulatory Visit: Payer: BC Managed Care – PPO | Admitting: Physical Therapy

## 2020-02-02 DIAGNOSIS — M545 Low back pain, unspecified: Secondary | ICD-10-CM | POA: Diagnosis not present

## 2020-02-02 NOTE — Therapy (Signed)
[facility] [address]. [place], [place], [address] Phone: [phone]   Fax:  [phone]  Physical Therapy Treatment  Patient Details  Name: Ray Case MRN: [id number] Date of Birth: 21-Jul-1965 Referring Provider (PT): Terrilee Files   Encounter Date: 02/02/2020   PT End of Session - 02/02/20 1017    Visit Number 9    Date for PT Re-Evaluation 03/18/20    PT Start Time 0936    PT Stop Time 1017    PT Time Calculation (min) 41 min    Activity Tolerance Patient tolerated treatment well    Behavior During Therapy [facility] for tasks assessed/performed           Past Medical History:  Diagnosis Date  . Achilles tendon rupture 2014   Left  . Complication of anesthesia    Pt had previouds experience of feeling pain after local anesthesia  . Headache(784.0)   . Hyperlipidemia   . Tuberculosis    Pt exposed and medicated  1990's    Past Surgical History:  Procedure Laterality Date  . ACHILLES TENDON SURGERY Left 02/24/2013   Procedure: LEFT ACHILLES TENDON REPAIR;  Surgeon: Cheral Almas, MD;  Location: [facility] OR;  Service: Orthopedics;  Laterality: Left;  . FRACTURE SURGERY Right     arm, metal pin    There were no vitals filed for this visit.   Subjective Assessment - 02/02/20 0942    Subjective Patient doing okay today. Has some pain in his lowback and he thinks it's due to him not being consistent with going to the gym lately.    Currently in Pain? Yes    Pain Score 3                              [facility] Adult PT Treatment/Exercise - 02/02/20 0001      Lumbar Exercises: Aerobic   Recumbent Bike L2      Lumbar Exercises: Machines for Strengthening   Leg Press #40 2x10    Other Lumbar Machine Exercise Rows & Lats #25 2x10      Lumbar Exercises: Standing   Other Standing Lumbar Exercises Alt marches cable #10 2x10       Lumbar Exercises: Supine   Dead Bug 10 reps;2 seconds   #1 bar w/  Bil arm ext.    Bridge 10 reps;3 seconds      Lumbar Exercises: Quadruped   Other Quadruped Lumbar Exercises Bird-dog x10    #1 bar for tactile posture reinforcement                   PT Short Term Goals - 01/19/20 1419      PT SHORT TERM GOAL #1   Title independent with intiial HEP    Status Achieved             PT Long Term Goals - 02/02/20 1013      PT LONG TERM GOAL #1   Title understand posture and body mechanics    Status Achieved      PT LONG TERM GOAL #2   Title report able to walk 1 mile with 50% less pain    Baseline Thinks he could walk about 1/4 a mile with no pain    Status Partially Met      PT LONG TERM GOAL #3   Title reports no issues with sitting since taking round of prednisone.  Status Achieved      PT LONG TERM GOAL #4   Title independent with advanced HEP or gym setting    Baseline Very inconsistent but feels capable of performing all exercies on HEP and at the gym.    Status Partially Met                 Plan - 02/02/20 1017    Clinical Impression Statement Came in with increased symptoms due to inactivity the last 2 days and sitting at a desk for a majority of the day. He was able to perform all ther ex with no increase in LBP. Patient reports struggling with motivation and accoutability and he feels like PT helps him with that. Discussed the importance of staying physically active and going to the gym outside of therapy. Also talked about integrating elliptical into aerobic exercises plan to promote hip extension. Patient is very tight in Bil hip flexors and quads.    PT Treatment/Interventions ADLs/Self Care Home Management;Electrical Stimulation;Moist Heat;Traction;Functional mobility training;Therapeutic activities;Therapeutic exercise;Neuromuscular re-education;Manual techniques;Dry needling;Patient/family education    PT Next Visit Plan Core stability, posterior chain strengthening and traction as indicated.            Patient will benefit from skilled therapeutic intervention in order to improve the following deficits and impairments:  Difficulty walking, Impaired flexibility, Postural dysfunction, Pain, Improper body mechanics, Increased muscle spasms, Decreased range of motion  Visit Diagnosis: Chronic bilateral low back pain without sciatica     Problem List Patient Active Problem List   Diagnosis Date Noted  . Lactic acidosis 12/29/2018  . Alcohol use disorder, moderate, in early remission ([place]) 12/29/2018  . Alcohol withdrawal ([place]) 12/29/2018  . Nausea, vomiting and diarrhea 12/28/2018  . SOB (shortness of breath) 12/28/2018  . Chest pain 12/28/2018  . Hypokalemia 12/28/2018  . Hypomagnesemia 12/28/2018  . Hamstring tendinitis of left thigh 07/01/2018  . Nonallopathic lesion of lumbosacral region 12/03/2017  . Nonallopathic lesion of rib cage 12/03/2017  . Elevated LDL cholesterol level 06/13/2017  . Cervical radiculopathy 06/13/2017  . Right elbow pain 06/13/2017  . Tibialis posterior tendinitis, right 11/27/2016  . Hip abductor tendinitis 11/27/2016  . Encounter for health maintenance examination with abnormal findings 07/13/2014  . Chronic neck pain 07/12/2014  . Nonallopathic lesion of cervical region 07/12/2014  . Nonallopathic lesion of thoracic region 07/12/2014  . Nonallopathic lesion of sacral region 07/12/2014  . Chronic back pain 06/15/2014  . Insomnia 05/24/2013  . Achilles tendon rupture 02/24/2013  . LOW BACK PAIN, CHRONIC 11/03/2008  . HERPES, GENITAL NOS 01/16/2007  . GAD (generalized anxiety disorder) 01/16/2007  . DEPRESSION 01/16/2007  . ALLERGIC RHINITIS 01/16/2007    Lavenia Atlas 02/02/2020, 10:27 AM  [place]. [place], [place], [address] Phone: [phone]   Fax:  [phone]  Name: Ray Case MRN: [id number] Date of Birth: 12-26-1965

## 2020-02-07 ENCOUNTER — Ambulatory Visit: Payer: BC Managed Care – PPO | Admitting: Physical Therapy

## 2020-02-07 ENCOUNTER — Encounter: Payer: Self-pay | Admitting: Physical Therapy

## 2020-02-07 ENCOUNTER — Other Ambulatory Visit: Payer: Self-pay

## 2020-02-07 DIAGNOSIS — M545 Low back pain, unspecified: Secondary | ICD-10-CM | POA: Diagnosis not present

## 2020-02-07 DIAGNOSIS — G8929 Other chronic pain: Secondary | ICD-10-CM

## 2020-02-07 NOTE — Therapy (Signed)
[place]. [place], [place], [address] Phone: [phone]   Fax:  [phone]  Physical Therapy Treatment  Patient Details  Name: Ray Case MRN: [id number] Date of Birth: 1965/12/05 Referring Provider (PT): Charlann Boxer   Encounter Date: 02/07/2020   PT End of Session - 02/07/20 1009    Visit Number 7   Date for PT Re-Evaluation 03/18/20    PT Start Time 0940    PT Stop Time 1018    PT Time Calculation (min) 38 min    Activity Tolerance Patient tolerated treatment well    Behavior During Therapy [facility] for tasks assessed/performed           Past Medical History:  Diagnosis Date  . Achilles tendon rupture 2014   Left  . Complication of anesthesia    Pt had previouds experience of feeling pain after local anesthesia  . Headache(784.0)   . Hyperlipidemia   . Tuberculosis    Pt exposed and medicated  1990's    Past Surgical History:  Procedure Laterality Date  . ACHILLES TENDON SURGERY Left 02/24/2013   Procedure: LEFT ACHILLES TENDON REPAIR;  Surgeon: Marianna Payment, MD;  Location: [place];  Service: Orthopedics;  Laterality: Left;  . FRACTURE SURGERY Right     arm, metal pin    There were no vitals filed for this visit.   Subjective Assessment - 02/07/20 0941    Subjective Doing well today. Has not been consistent in going to the gym lately. Tries to do stretches in the morning.    Currently in Pain? Yes    Pain Score 1                              [facility] Adult PT Treatment/Exercise - 02/07/20 0001      Lumbar Exercises: Stretches   Other Lumbar Stretch Exercise [address] 1x60 sec ea side PNF- hold relax      Lumbar Exercises: Aerobic   UBE (Upper Arm Bike) L2 2 mins ea way    Recumbent Bike L3 77mns      Lumbar Exercises: Machines for Strengthening   Leg Press #60 2x10    Other Lumbar Machine Exercise Rows & Lats #35 2x10      Lumbar Exercises: Standing   Other  Standing Lumbar Exercises Hip Flex. + Thoracic Rot at wall x10 ea side    Other Standing Lumbar Exercises RDL's 2x10 #20 KB, Hip clock x4 ea side G theraband                    PT Short Term Goals - 01/19/20 1419      PT SHORT TERM GOAL #1   Title independent with intiial HEP    Status Achieved             PT Long Term Goals - 02/02/20 1013      PT LONG TERM GOAL #1   Title understand posture and body mechanics    Status Achieved      PT LONG TERM GOAL #2   Title report able to walk 1 mile with 50% less pain    Baseline Thinks he could walk about 1/4 a mile with no pain    Status Partially Met      PT LONG TERM GOAL #3   Title reports no issues with sitting since taking round of prednisone.    Status  Achieved      PT LONG TERM GOAL #4   Title independent with advanced HEP or gym setting    Baseline Very inconsistent but feels capable of performing all exercies on HEP and at the gym.    Status Partially Met                 Plan - 02/07/20 1023    Clinical Impression Statement Arrived to clinic 10 mins late. Came in to PT today feeling well. He was able to tolerate an increase in all weights and reps in exercises today. Introduced DL RDL's and patient experienced fatigue specifically in the throacolumbar junction but no pain reported. Discussed starting a normal routine of going to the gym to maintain progress made at PT.    PT Treatment/Interventions ADLs/Self Care Home Management;Electrical Stimulation;Moist Heat;Traction;Functional mobility training;Therapeutic activities;Therapeutic exercise;Neuromuscular re-education;Manual techniques;Dry needling;Patient/family education    PT Next Visit Plan Muscular endurance for lumbar spine, posterior chain strengthening and flexibility of hip flexors           Patient will benefit from skilled therapeutic intervention in order to improve the following deficits and impairments:  Difficulty walking, Impaired  flexibility, Postural dysfunction, Pain, Improper body mechanics, Increased muscle spasms, Decreased range of motion  Visit Diagnosis: Chronic bilateral low back pain without sciatica     Problem List Patient Active Problem List   Diagnosis Date Noted  . Lactic acidosis 12/29/2018  . Alcohol use disorder, moderate, in early remission ([place]) 12/29/2018  . Alcohol withdrawal ([place]) 12/29/2018  . Nausea, vomiting and diarrhea 12/28/2018  . SOB (shortness of breath) 12/28/2018  . Chest pain 12/28/2018  . Hypokalemia 12/28/2018  . Hypomagnesemia 12/28/2018  . Hamstring tendinitis of left thigh 07/01/2018  . Nonallopathic lesion of lumbosacral region 12/03/2017  . Nonallopathic lesion of rib cage 12/03/2017  . Elevated LDL cholesterol level 06/13/2017  . Cervical radiculopathy 06/13/2017  . Right elbow pain 06/13/2017  . Tibialis posterior tendinitis, right 11/27/2016  . Hip abductor tendinitis 11/27/2016  . Encounter for health maintenance examination with abnormal findings 07/13/2014  . Chronic neck pain 07/12/2014  . Nonallopathic lesion of cervical region 07/12/2014  . Nonallopathic lesion of thoracic region 07/12/2014  . Nonallopathic lesion of sacral region 07/12/2014  . Chronic back pain 06/15/2014  . Insomnia 05/24/2013  . Achilles tendon rupture 02/24/2013  . LOW BACK PAIN, CHRONIC 11/03/2008  . HERPES, GENITAL NOS 01/16/2007  . GAD (generalized anxiety disorder) 01/16/2007  . DEPRESSION 01/16/2007  . ALLERGIC RHINITIS 01/16/2007    Lavenia Atlas, SPTA 02/07/2020, 10:24 AM  [place]. [place], [place], [address] Phone: [phone]   Fax:  [phone]  Name: Ray Case MRN: [id number] Date of Birth: 1966-02-14

## 2020-02-09 ENCOUNTER — Encounter: Payer: Self-pay | Admitting: Physical Therapy

## 2020-02-09 ENCOUNTER — Ambulatory Visit: Payer: BC Managed Care – PPO | Admitting: Physical Therapy

## 2020-02-09 ENCOUNTER — Other Ambulatory Visit: Payer: Self-pay

## 2020-02-09 DIAGNOSIS — G8929 Other chronic pain: Secondary | ICD-10-CM

## 2020-02-09 DIAGNOSIS — M545 Low back pain, unspecified: Secondary | ICD-10-CM | POA: Diagnosis not present

## 2020-02-09 NOTE — Therapy (Signed)
[address] [place]. [place], [place], [address] Phone: [phone]   Fax:  [phone]  Physical Therapy Treatment  Patient Details  Name: Ray Case MRN: [id number] Date of Birth: October 02, 1965 Referring Provider (PT): Charlann Boxer   Encounter Date: 02/09/2020   PT End of Session - 02/09/20 1007    Visit Number 8    Date for PT Re-Evaluation 03/18/20    PT Start Time 0934    PT Stop Time 1012    PT Time Calculation (min) 38 min    Activity Tolerance Patient tolerated treatment well    Behavior During Therapy [facility] for tasks assessed/performed           Past Medical History:  Diagnosis Date   Achilles tendon rupture 7014   Left   Complication of anesthesia    Pt had previouds experience of feeling pain after local anesthesia   Headache(784.0)    Hyperlipidemia    Tuberculosis    Pt exposed and medicated  1990's    Past Surgical History:  Procedure Laterality Date   ACHILLES TENDON SURGERY Left 02/24/2013   Procedure: LEFT ACHILLES TENDON REPAIR;  Surgeon: Marianna Payment, MD;  Location: [place];  Service: Orthopedics;  Laterality: Left;   FRACTURE SURGERY Right     arm, metal pin    There were no vitals filed for this visit.   Subjective Assessment - 02/09/20 0935    Subjective "Just sore today" Went to the gym yesterday and did some general strength training. Feels overall pretty good.    Currently in Pain? Yes    Pain Score 2                              [facility] Adult PT Treatment/Exercise - 02/09/20 0001      Ambulation/Gait   Ambulation/Gait Yes    Ambulation/Gait Assistance 7: Independent    Gait Comments decreased bil hip extension, toe out of R foot, PPT   Had to stop around 8 mins due to increasing back discomfort     Lumbar Exercises: Aerobic   UBE (Upper Arm Bike) L3 9mns ea way      Lumbar Exercises: Standing   Other Standing Lumbar Exercises Cable marches 2x10,  #15, Walk outs x laps ea way, ER with G theraband     Other Standing Lumbar Exercises Hip ext./abd. 2x10 # 3, standing t-spine rot at wall      Lumbar Exercises: Seated   Other Seated Lumbar Exercises 3 way Ball roll-outs                     PT Short Term Goals - 01/19/20 1419      PT SHORT TERM GOAL #1   Title independent with intiial HEP    Status Achieved             PT Long Term Goals - 02/09/20 1012      PT LONG TERM GOAL #1   Title understand posture and body mechanics    Status Achieved      PT LONG TERM GOAL #2   Title report able to walk 1 mile with 50% less pain    Baseline Can walk 1/4 mile with no pain but fatigue    Status Partially Met      PT LONG TERM GOAL #3   Title reports no issues with sitting since taking round of  prednisone.    Status Achieved      PT LONG TERM GOAL #4   Title independent with advanced HEP or gym setting    Baseline Very inconsistent but feels capable of performing all exercies on HEP and at the gym.    Status Achieved                 Plan - 02/09/20 1009    Clinical Impression Statement Patient is responding well to therapy treatment is able to tolerate increased reps/sets on all ther ex activities. Is still lacking with muscular endurance of the lumbar stabilizers, he also has very tight hip flexors and decreased strength in his glute max. While walking outdoors, patient fatigued quickly when walking up slight incline. He exhibits a lack of Bil hip extension as well as toe out on his R foot.Recommende to patient that he tries to be consistent at the gym and continue with all HEP.    PT Treatment/Interventions ADLs/Self Care Home Management;Electrical Stimulation;Moist Heat;Traction;Functional mobility training;Therapeutic activities;Therapeutic exercise;Neuromuscular re-education;Manual techniques;Dry needling;Patient/family education    PT Next Visit Plan Discharge           Patient will benefit from skilled  therapeutic intervention in order to improve the following deficits and impairments:  Difficulty walking, Impaired flexibility, Postural dysfunction, Pain, Improper body mechanics, Increased muscle spasms, Decreased range of motion  Visit Diagnosis: Chronic bilateral low back pain without sciatica     Problem List Patient Active Problem List   Diagnosis Date Noted   Lactic acidosis 12/29/2018   Alcohol use disorder, moderate, in early remission ([place]) 12/29/2018   Alcohol withdrawal ([place]) 12/29/2018   Nausea, vomiting and diarrhea 12/28/2018   SOB (shortness of breath) 12/28/2018   Chest pain 12/28/2018   Hypokalemia 12/28/2018   Hypomagnesemia 12/28/2018   Hamstring tendinitis of left thigh 07/01/2018   Nonallopathic lesion of lumbosacral region 12/03/2017   Nonallopathic lesion of rib cage 12/03/2017   Elevated LDL cholesterol level 06/13/2017   Cervical radiculopathy 06/13/2017   Right elbow pain 06/13/2017   Tibialis posterior tendinitis, right 11/27/2016   Hip abductor tendinitis 11/27/2016   Encounter for health maintenance examination with abnormal findings 07/13/2014   Chronic neck pain 07/12/2014   Nonallopathic lesion of cervical region 07/12/2014   Nonallopathic lesion of thoracic region 07/12/2014   Nonallopathic lesion of sacral region 07/12/2014   Chronic back pain 06/15/2014   Insomnia 05/24/2013   Achilles tendon rupture 02/24/2013   LOW BACK PAIN, CHRONIC 11/03/2008   HERPES, GENITAL NOS 01/16/2007   GAD (generalized anxiety disorder) 01/16/2007   DEPRESSION 01/16/2007   ALLERGIC RHINITIS 01/16/2007   PHYSICAL THERAPY DISCHARGE SUMMARY  Visits from Start of Care: 8   Plan: Patient agrees to discharge.  Patient goals were not met. Patient is being discharged due to being pleased with the current functional level.  ?????     Lavenia Atlas, SPTA 02/09/2020, 10:15 AM  [place]. [place], [place], [address] Phone: [phone]   Fax:  [phone]  Name: Ray Case MRN: [id number] Date of Birth: Aug 16, 1965

## 2020-02-11 ENCOUNTER — Encounter: Payer: Self-pay | Admitting: Family Medicine

## 2020-02-28 IMAGING — MR MRI LUMBAR SPINE WITHOUT CONTRAST
4 of 5 series · 25 of 48 positions shown · non-contrast
Comparison: Radiographs of 12/30/2016

CLINICAL DATA: Low back pain worsening over the last few months.
Weakness in the right hip.

EXAM:
MRI LUMBAR SPINE WITHOUT CONTRAST
TECHNIQUE: Multiplanar, multisequence MR imaging of the lumbar spine was
performed. No intravenous contrast was administered.

[Series 2: T2 · sagittal · 4.0mm · 0.55mm/px · 6 of 16 slices shown (1 of 2)]
[im 1/16]
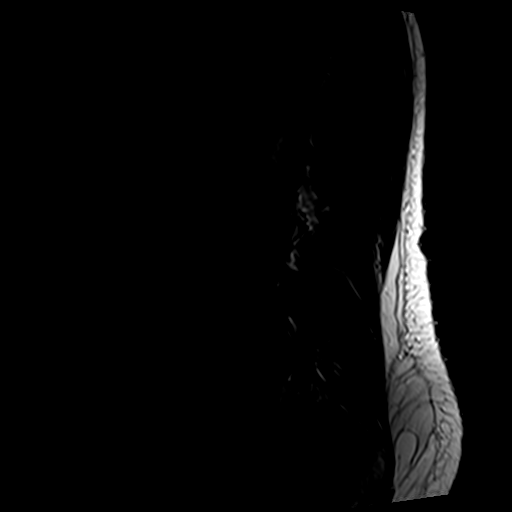
[im 4/16]
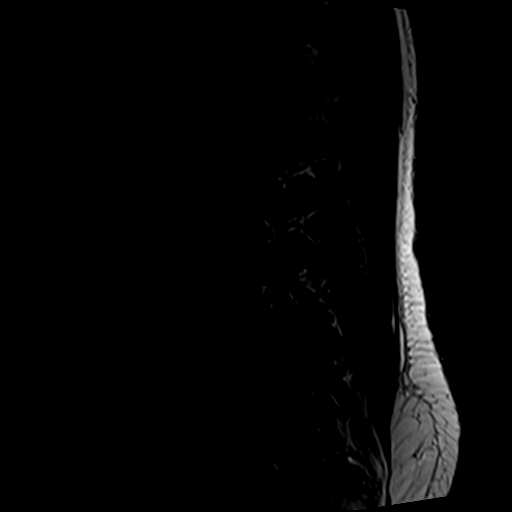
[im 7/16]
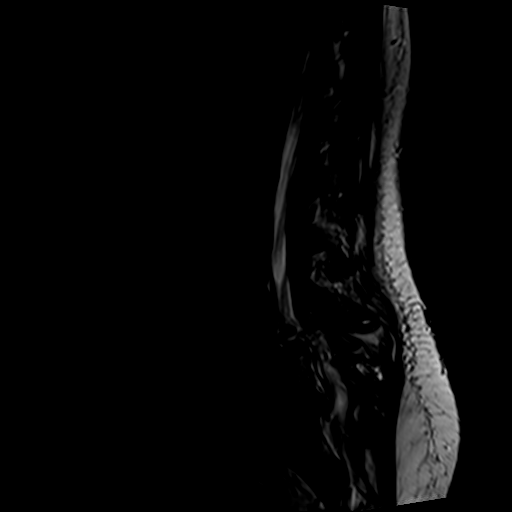
[im 10/16]
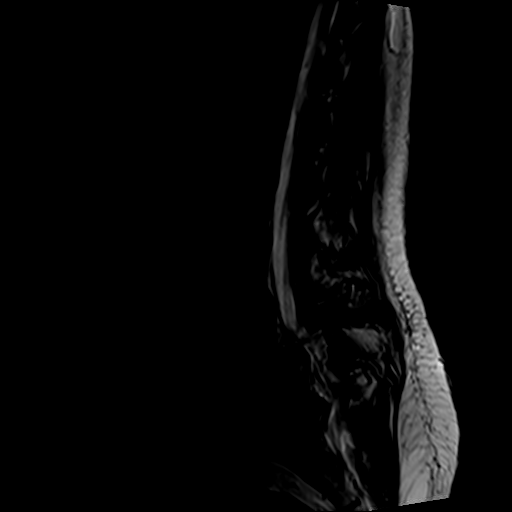
[im 13/16]
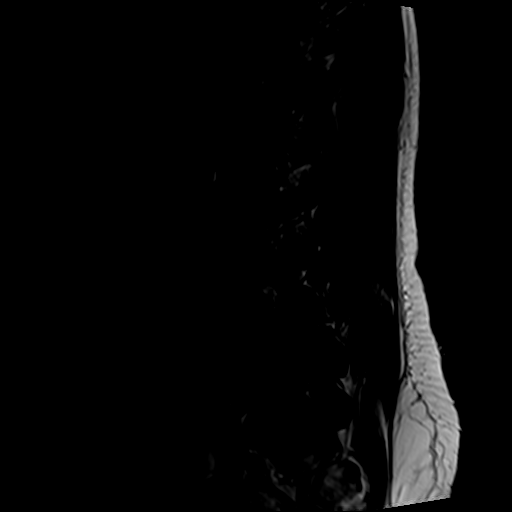
[im 16/16]
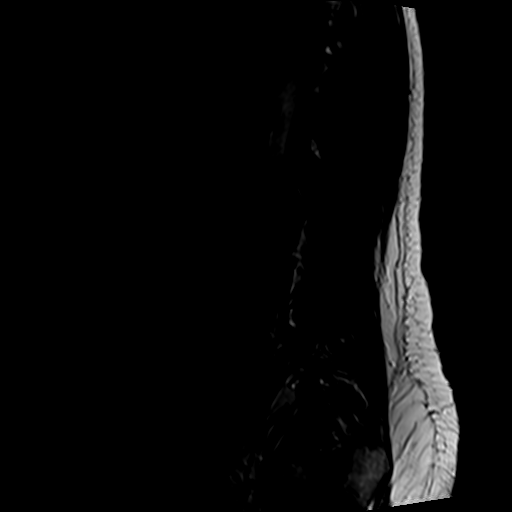

[Series 4: T1 · sagittal · 4.0mm · 0.55mm/px · 6 of 16 slices shown (1 of 2)]
[im 1/16]
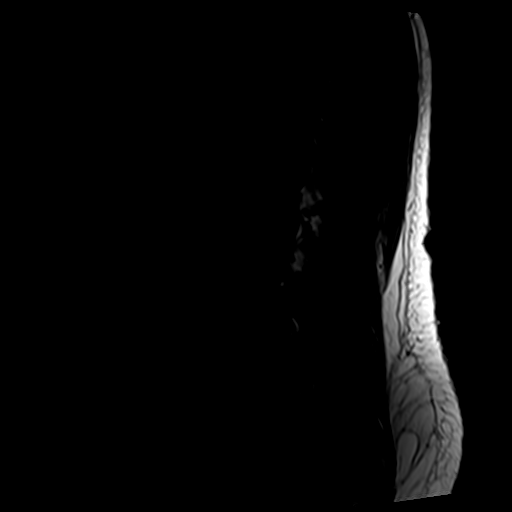
[im 4/16]
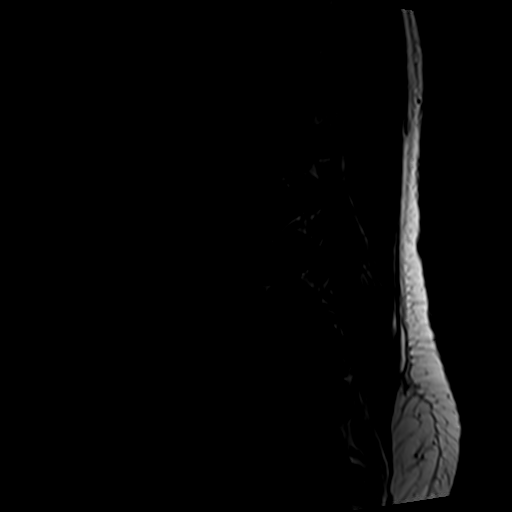
[im 7/16]
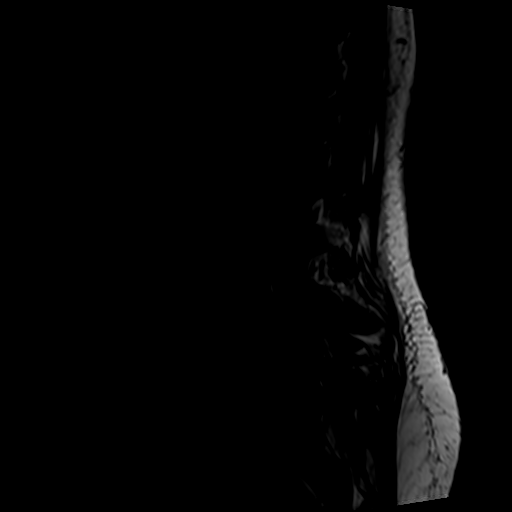
[im 10/16]
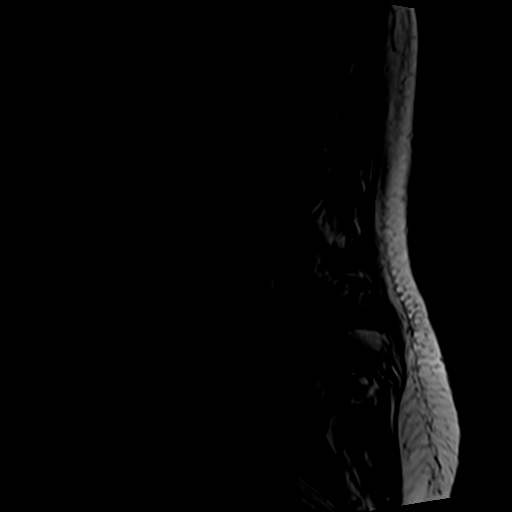
[im 13/16]
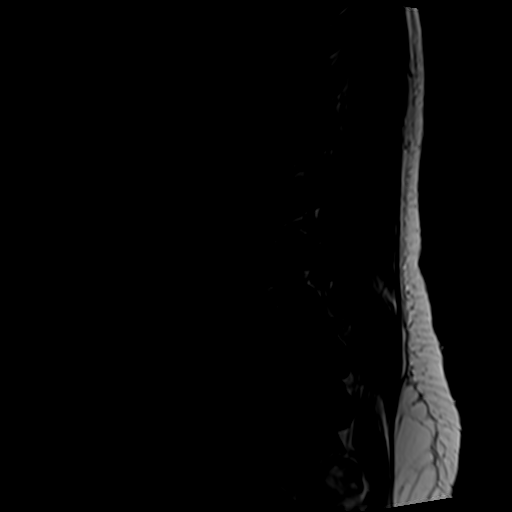
[im 16/16]
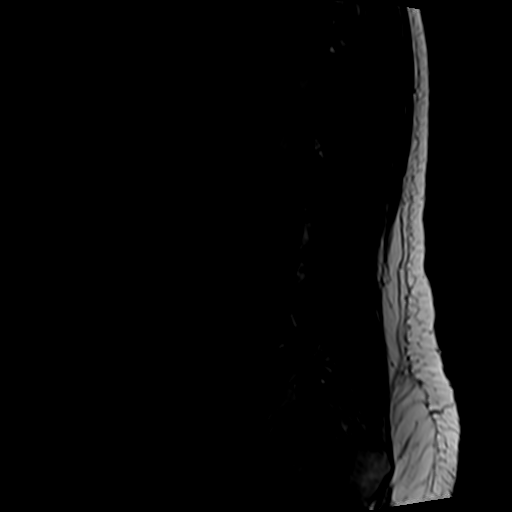

[Series 5: T2 · axial · 4.0mm · 0.70mm/px · z∈[-132,+84]mm · 9 of 38 slices shown (2 of 2)]
[im 1/38]
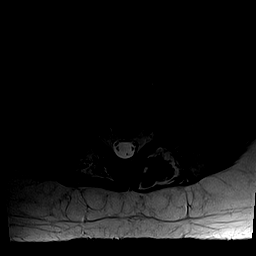
[im 6/38]
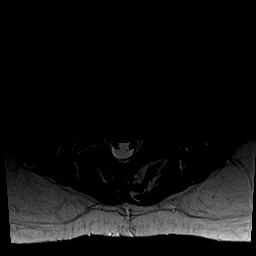
[im 11/38]
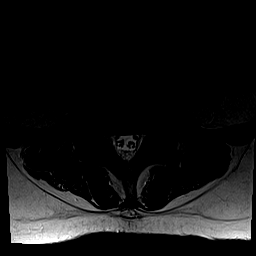
[im 16/38]
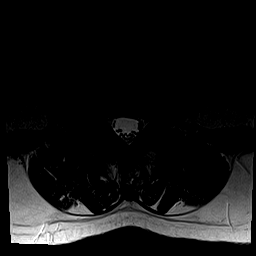
[im 19/38]
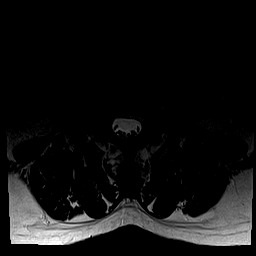
[im 22/38]
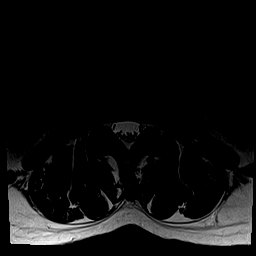
[im 27/38]
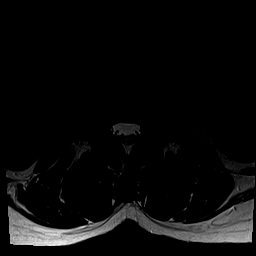
[im 32/38]
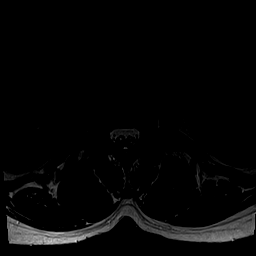
[im 38/38]
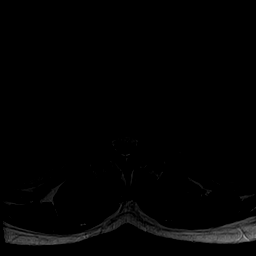

[Series 6: T1 · axial · 4.0mm · 0.35mm/px · z∈[-132,+52]mm · 4 of 38 slices shown (2 of 2)]
[im 1/38]
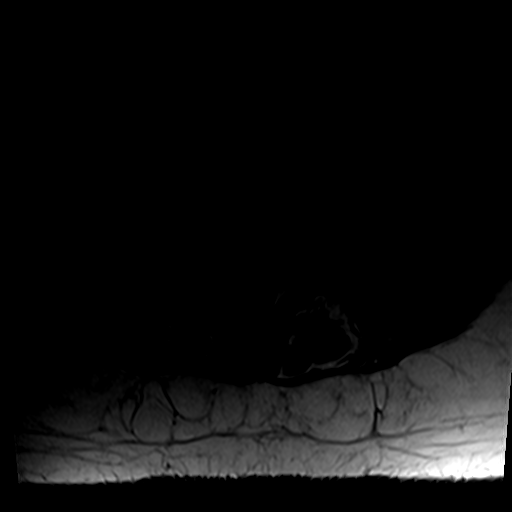
[im 6/38]
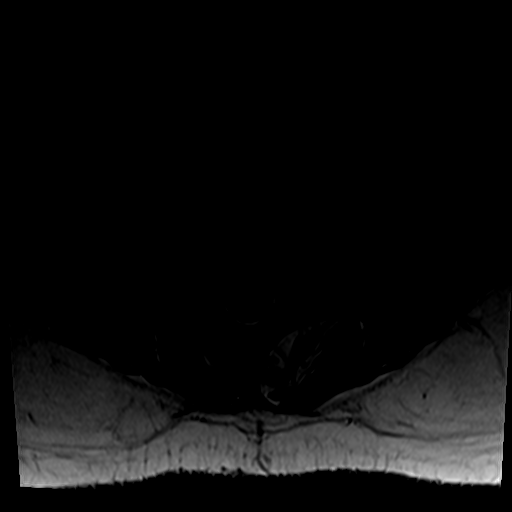
[im 19/38]
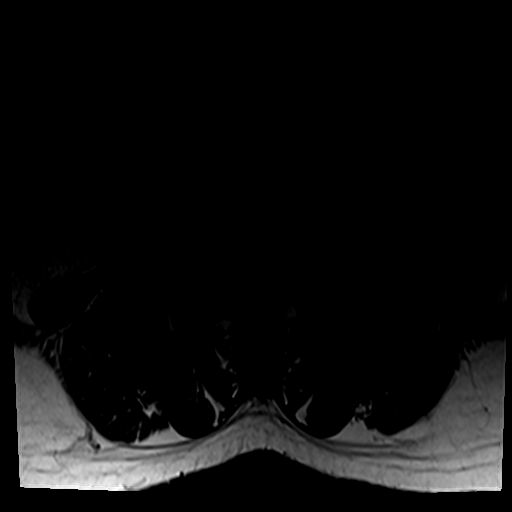
[im 32/38]
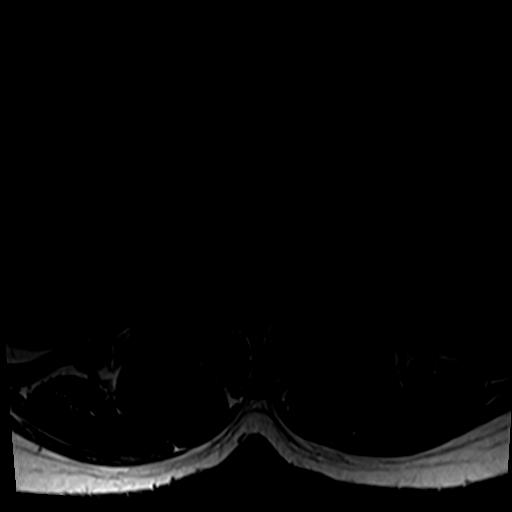

[25 of 48 positions shown; findings below may reference images not displayed]

FINDINGS: Segmentation: The lowest lumbar type non-rib-bearing vertebra is
labeled as L5.

Alignment: 10 mm of grade 2 anterolisthesis of L4 on L5 associated
with bilateral chronic pars defects L4.

Vertebrae: Considerable type 1 and type 3 degenerative endplate
findings at L4-5. Type 2 degenerative endplate findings at L5-S1.
Loss of disc height at both of these levels with disc desiccation.
Small Schmorl's nodes along the inferior endplate of L5. The
endplate edema extends into the pedicles at the L5 level.

As noted above there are chronic bilateral pars defects at L4. There
is mild interspinous edema at L3-4.

Conus medullaris and cauda equina: Conus extends to the L1 level.
Conus and cauda equina appear normal.

Paraspinal and other soft tissues: Unremarkable

Disc levels:

T12-L1: Unremarkable.

L1-2: Unremarkable.

L2-3: Unremarkable.

L3-4: No impingement.  Mild disc bulge.

L4-5: Severe bilateral foraminal stenosis due to disc uncovering,
disc bulge, and left foraminal disc protrusion.

L5-S1: Moderate left and mild right foraminal stenosis due to disc
bulge, intervertebral spurring, and facet spurring.
IMPRESSION: 1. Severe bilateral foraminal impingement at L4-5 due to pars
defects and grade 2 anterolisthesis leading to disc uncovering, disc
bulge, and left foraminal disc protrusion.
2. Moderate left and mild right foraminal impingement at L5-S1 due
to spondylosis and degenerative disc disease.
3. Degenerative endplate findings at L4-5 and L5-S1.
4. Interspinous edema at L3-4, which can be seen in the setting of
Baastrup's disease.

## 2020-03-30 ENCOUNTER — Other Ambulatory Visit: Payer: Self-pay

## 2020-03-30 ENCOUNTER — Encounter: Payer: Self-pay | Admitting: Physical Therapy

## 2020-03-30 ENCOUNTER — Ambulatory Visit
Admission: RE | Admit: 2020-03-30 | Discharge: 2020-03-30 | Disposition: A | Discharge status: Home / Self Care | Payer: BC Managed Care – PPO | Source: Ambulatory Visit | Attending: Family Medicine | Admitting: Family Medicine

## 2020-03-30 ENCOUNTER — Encounter: Payer: Self-pay | Admitting: Family Medicine

## 2020-03-30 DIAGNOSIS — M5416 Radiculopathy, lumbar region: Secondary | ICD-10-CM

## 2020-03-30 NOTE — Discharge Instructions (Signed)

## 2020-05-09 ENCOUNTER — Other Ambulatory Visit: Payer: Self-pay

## 2020-05-09 ENCOUNTER — Encounter: Payer: Self-pay | Admitting: Family Medicine

## 2020-05-09 ENCOUNTER — Ambulatory Visit: Payer: BC Managed Care – PPO | Admitting: Family Medicine

## 2020-05-09 VITALS — BP 100/70 | HR 73 | Ht 71.0 in | Wt 233.0 lb

## 2020-05-09 DIAGNOSIS — M5416 Radiculopathy, lumbar region: Secondary | ICD-10-CM

## 2020-05-09 DIAGNOSIS — R635 Abnormal weight gain: Secondary | ICD-10-CM

## 2020-05-09 DIAGNOSIS — E6609 Other obesity due to excess calories: Secondary | ICD-10-CM

## 2020-05-09 DIAGNOSIS — G8929 Other chronic pain: Secondary | ICD-10-CM | POA: Diagnosis not present

## 2020-05-09 DIAGNOSIS — E669 Obesity, unspecified: Secondary | ICD-10-CM | POA: Insufficient documentation

## 2020-05-09 DIAGNOSIS — Z6832 Body mass index (BMI) 32.0-32.9, adult: Secondary | ICD-10-CM

## 2020-05-09 DIAGNOSIS — M5441 Lumbago with sciatica, right side: Secondary | ICD-10-CM | POA: Diagnosis not present

## 2020-05-09 DIAGNOSIS — M545 Low back pain, unspecified: Secondary | ICD-10-CM

## 2020-05-09 NOTE — Assessment & Plan Note (Addendum)
Patient has chronic back pain.  MRI did show the patient does have instability of the back with a pars defect that I do think contributes to no nerve impingement.  Patient likely does need some type of fusion to stop with movement.  Patient has been trying to avoid those surgery for long amount of time.  He continues on the Celebrex twice daily.  Discussed with patient that I do feel that he is in good hands with surgery around this particular patient would like second opinion [place] and we will send any information he needs once he has the name of the physicians he would like to see.  At this point I do not think we have anything else to add from our office and we are here for any questions and can help mitigate concerns with him.  Total time discussing with patient reviewing patient's previous MRIs and notes 37 minutes

## 2020-05-09 NOTE — Patient Instructions (Addendum)
Good to see you  Send [place] the name of the two physicians and we will send our information  Continue what you are doing Referral to healthy weight and wellness Dr. Earlene Plater I am here for any questions or if you want to see anyone else locally I can make suggestions.

## 2020-05-09 NOTE — Assessment & Plan Note (Signed)
Patient does have obesity.  Has gained a significant amount of weight with him being more immobile.  Patient is doing low impact exercise but will refer patient to healthy weight and wellness to see if this will be something that could be beneficial and give him more control.

## 2020-05-09 NOTE — Progress Notes (Signed)
[name] [address] [place] [address] Phone: [phone] Subjective:   Ray Case, am serving as a scribe for Dr. Antoine Case.  This visit occurred during the SARS-CoV-2 public health emergency.  Safety protocols were in place, including screening questions prior to the visit, additional usage of staff PPE, and extensive cleaning of exam room while observing appropriate contact time as indicated for disinfecting solutions.    I'm seeing this patient by the request  of:  Mliss Sax, MD  CC: Low back pain follow-up EHO:[id number]   03/19/2019 Old gentleman with chronic bilateral low back pain without any significant sciatic, or weakness of the lower extremities, has done formal physical therapy, home exercise program, multiple different medications with varying degrees of success.  Patient still wants to consider the possibility of the epidural but is hesitant to do this.  Patient would like to not to be on Effexor or any other medications at this point.  We discussed anti-inflammatories as a possible suggestion if he needed to.  Patient will get back to [place] if he decides to go through with the epidural.  Encourage patient to continue to do the home exercises.  Follow-up again in 4 to 8 weeks  Update 05/09/2020 Ray Case is a 55 y.o. male coming in with complaint of low back pain. Finished PT in late 2021. Taking 100mg  Celebrex. Patient states  That he is here to follow up in regard to his back pain and to bring up to speed on his visit with Dr. [place] and possible surgery. patient is scared about the back surgery and is wanting to seek a second opinion.  Patient was told that he would need to have an anterior and posterior approach which did make him concerned. There is anything less invasive that can be done.  Patient states that he does go to the gym and feels relatively good with the gym but if he walks for greater than 3  minutes at any point he starts having the tightness in the back and some of the leg pain.  Has to stop immediately.  Continues to gain weight at this time.       Past Medical History:  Diagnosis Date  . Achilles tendon rupture 2014   Left  . Complication of anesthesia    Pt had previouds experience of feeling pain after local anesthesia  . Headache(784.0)   . Hyperlipidemia   . Tuberculosis    Pt exposed and medicated  1990's   Past Surgical History:  Procedure Laterality Date  . ACHILLES TENDON SURGERY Left 02/24/2013   Procedure: LEFT ACHILLES TENDON REPAIR;  Surgeon: 02/26/2013, MD;  Location: [facility] OR;  Service: Orthopedics;  Laterality: Left;  . FRACTURE SURGERY Right     arm, metal pin   Social History   Socioeconomic History  . Marital status: Single    Spouse name: Not on file  . Number of children: 0  . Years of education: Not on file  . Highest education level: Not on file  Occupational History  . Occupation: professor    Employer: [employer]  Tobacco Use  . Smoking status: Former Smoker    Packs/day: 0.25    Years: 8.00    Pack years: 2.00    Types: Cigarettes    Quit date: 12/27/2013    Years since quitting: 6.3  . Smokeless tobacco: Never Used  . Tobacco comment: pt goes in and out trying to  quit  Substance and Sexual Activity  . Alcohol use: Not Currently    Comment: None since 9/20.   . Drug use: Yes    Types: Marijuana  . Sexual activity: Not on file  Other Topics Concern  . 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   One caffeinated beverage daily   Social Determinants of Health   Financial Resource Strain: Not on file  Food Insecurity: Not on file  Transportation Needs: Not on file  Physical Activity: Not on file  Stress: Not on file  Social Connections: Not on file   No Known Allergies Family History  Problem Relation Age of Onset  . Cancer - Lung Mother        Mother died of lung cancer at  age 32 (smoker)  . Hypertension Father        Father died in his 56s secondary to aortic aneurysm. He has history of stroke and was a smoker  . AAA (abdominal aortic aneurysm) Father   . Diverticulosis Sister   . Schizophrenia Cousin        and nephew  . Obesity Sister        Sister died at age 86 secondary to complications of morbid obesity     Current Outpatient Medications (Cardiovascular):  .  rosuvastatin (CRESTOR) 20 MG tablet, TAKE 1 TABLET BY MOUTH EVERY DAY   Current Outpatient Medications (Analgesics):  .  celecoxib (CELEBREX) 100 MG capsule, Take 1 capsule (100 mg total) by mouth 2 (two) times daily.   Current Outpatient Medications (Other):  .  triamcinolone ointment (KENALOG) 0.1 %, Apply to itchy spots twice daily as needed. Not for face or private areas. (Patient not taking: Reported on 05/09/2020)   Reviewed prior external information including notes and imaging from  primary care provider As well as notes that were available from care everywhere and other healthcare systems.  Past medical history, social, surgical and family history all reviewed in electronic medical record.  No pertanent information unless stated regarding to the chief complaint.   Review of Systems:  No headache, visual changes, nausea, vomiting, diarrhea, constipation, dizziness, abdominal pain, skin rash, fevers, chills, night sweats, weight loss, swollen lymph nodes, body aches, joint swelling, chest pain, shortness of breath, mood changes. POSITIVE muscle aches  Objective  Blood pressure 100/70, pulse 73, height 5\' 11"  (1.803 m), weight 233 lb (105.7 kg), SpO2 97 %.   General: No apparent distress alert and oriented x3 mood and affect normal, dressed appropriately.  Patient is overweight HEENT: Pupils equal, extraocular movements intact  Respiratory: Patient's speak in full sentences and does not appear short of breath  Cardiovascular: No lower extremity edema, non tender, no erythema   Gait normal with good balance and coordination.  MSK: Low back exam does have loss of lordosis.  Patient does have tightness noted the straight leg test bilaterally.  Patient does not have any radicular symptoms.  Lacks the last 5 degrees of flexion but no significant discomfort with extension of the back which is some mild improvement.    Impression and Recommendations:     The above documentation has been reviewed and is accurate and complete , DO

## 2020-05-10 NOTE — Telephone Encounter (Signed)
Referral placed.

## 2020-05-30 ENCOUNTER — Ambulatory Visit (INDEPENDENT_AMBULATORY_CARE_PROVIDER_SITE_OTHER): Payer: BC Managed Care – PPO | Admitting: Family Medicine

## 2020-05-31 ENCOUNTER — Other Ambulatory Visit: Payer: Self-pay

## 2020-05-31 ENCOUNTER — Ambulatory Visit (INDEPENDENT_AMBULATORY_CARE_PROVIDER_SITE_OTHER): Payer: BC Managed Care – PPO | Admitting: Family Medicine

## 2020-05-31 ENCOUNTER — Telehealth: Payer: Self-pay | Admitting: Family Medicine

## 2020-05-31 ENCOUNTER — Encounter (INDEPENDENT_AMBULATORY_CARE_PROVIDER_SITE_OTHER): Payer: Self-pay | Admitting: Family Medicine

## 2020-05-31 VITALS — BP 105/69 | HR 79 | Temp 98.7°F | Ht 71.0 in | Wt 217.0 lb

## 2020-05-31 DIAGNOSIS — R0602 Shortness of breath: Secondary | ICD-10-CM

## 2020-05-31 DIAGNOSIS — R5383 Other fatigue: Secondary | ICD-10-CM

## 2020-05-31 DIAGNOSIS — Z9189 Other specified personal risk factors, not elsewhere classified: Secondary | ICD-10-CM

## 2020-05-31 DIAGNOSIS — M545 Low back pain, unspecified: Secondary | ICD-10-CM | POA: Diagnosis not present

## 2020-05-31 DIAGNOSIS — G8929 Other chronic pain: Secondary | ICD-10-CM

## 2020-05-31 DIAGNOSIS — Z6830 Body mass index (BMI) 30.0-30.9, adult: Secondary | ICD-10-CM

## 2020-05-31 DIAGNOSIS — E7849 Other hyperlipidemia: Secondary | ICD-10-CM | POA: Diagnosis not present

## 2020-05-31 DIAGNOSIS — Z0289 Encounter for other administrative examinations: Secondary | ICD-10-CM

## 2020-05-31 DIAGNOSIS — F1911 Other psychoactive substance abuse, in remission: Secondary | ICD-10-CM

## 2020-05-31 DIAGNOSIS — Z1331 Encounter for screening for depression: Secondary | ICD-10-CM | POA: Diagnosis not present

## 2020-05-31 DIAGNOSIS — E669 Obesity, unspecified: Secondary | ICD-10-CM

## 2020-05-31 DIAGNOSIS — F411 Generalized anxiety disorder: Secondary | ICD-10-CM

## 2020-05-31 NOTE — Telephone Encounter (Signed)
Received a call from Dr. Vashti Hey in [place].  Patient had requested that I did discuss with him.  Patient did request a referral.  We have sent information but then wanted to talk to me.  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  Discussed with him about what we have tried in which type of HVLA and muscle energy techniques were used initially.  Discussed that patient was not responding as well and did respond to the epidural.  Discussed that patient is highly motivated and will be trying to lose weight.  Patient will be going to [place] for 1 week and we will see what type of response he can have and then try to get him involved with someone who would do similar technique potentially here in [place].  Patient can call if there is any questions as well as physician can call.  We have printed out his last physical therapy notes that patient can pick up and take with them if needed  Did not send any records today but patient can get them from medical records as well   Dr. Vashti Hey did ask for the MRI.  Discussed that patient would have to ask medical records for the MRI.

## 2020-06-01 LAB — CBC WITH DIFFERENTIAL/PLATELET
Basophils Absolute: 0.1 10*3/uL (ref 0.0–0.2)
Basos: 2 %
EOS (ABSOLUTE): 0.1 10*3/uL (ref 0.0–0.4)
Eos: 2 %
Hematocrit: 47.7 % (ref 37.5–51.0)
Hemoglobin: 15.5 g/dL (ref 13.0–17.7)
Immature Grans (Abs): 0 10*3/uL (ref 0.0–0.1)
Immature Granulocytes: 0 %
Lymphocytes Absolute: 3 10*3/uL (ref 0.7–3.1)
Lymphs: 52 %
MCH: 28.5 pg (ref 26.6–33.0)
MCHC: 32.5 g/dL (ref 31.5–35.7)
MCV: 88 fL (ref 79–97)
Monocytes Absolute: 0.4 10*3/uL (ref 0.1–0.9)
Monocytes: 7 %
Neutrophils Absolute: 2.2 10*3/uL (ref 1.4–7.0)
Neutrophils: 37 %
Platelets: 185 10*3/uL (ref 150–450)
RBC: 5.43 x10E6/uL (ref 4.14–5.80)
RDW: 13.3 % (ref 11.6–15.4)
WBC: 5.9 10*3/uL (ref 3.4–10.8)

## 2020-06-01 LAB — HEMOGLOBIN A1C
Est. average glucose Bld gHb Est-mCnc: 126 mg/dL
Hgb A1c MFr Bld: 6 % — ABNORMAL HIGH (ref 4.8–5.6)

## 2020-06-01 LAB — COMPREHENSIVE METABOLIC PANEL
ALT: 24 IU/L (ref 0–44)
AST: 20 IU/L (ref 0–40)
Albumin/Globulin Ratio: 1.9 (ref 1.2–2.2)
Albumin: 4.5 g/dL (ref 3.8–4.9)
Alkaline Phosphatase: 64 IU/L (ref 44–121)
BUN/Creatinine Ratio: 13 (ref 9–20)
BUN: 13 mg/dL (ref 6–24)
Bilirubin Total: 0.4 mg/dL (ref 0.0–1.2)
CO2: 24 mmol/L (ref 20–29)
Calcium: 9.7 mg/dL (ref 8.7–10.2)
Chloride: 103 mmol/L (ref 96–106)
Creatinine, Ser: 1.01 mg/dL (ref 0.76–1.27)
Globulin, Total: 2.4 g/dL (ref 1.5–4.5)
Glucose: 94 mg/dL (ref 65–99)
Potassium: 4.9 mmol/L (ref 3.5–5.2)
Sodium: 143 mmol/L (ref 134–144)
Total Protein: 6.9 g/dL (ref 6.0–8.5)
eGFR: 88 mL/min/{1.73_m2} (ref >59)

## 2020-06-01 LAB — INSULIN, RANDOM: INSULIN: 6 u[IU]/mL (ref 2.6–24.9)

## 2020-06-01 LAB — VITAMIN D 25 HYDROXY (VIT D DEFICIENCY, FRACTURES): Vit D, 25-Hydroxy: 14.9 ng/mL — ABNORMAL LOW (ref 30.0–100.0)

## 2020-06-01 LAB — LIPID PANEL
Chol/HDL Ratio: 3.5 ratio (ref 0.0–5.0)
Cholesterol, Total: 194 mg/dL (ref 100–199)
HDL: 56 mg/dL (ref >39)
LDL Chol Calc (NIH): 125 mg/dL — ABNORMAL HIGH (ref 0–99)
Triglycerides: 72 mg/dL (ref 0–149)
VLDL Cholesterol Cal: 13 mg/dL (ref 5–40)

## 2020-06-01 LAB — T4, FREE: Free T4: 1.25 ng/dL (ref 0.82–1.77)

## 2020-06-01 LAB — FOLATE: Folate: 8 ng/mL (ref >3.0)

## 2020-06-01 LAB — TSH: TSH: 0.971 u[IU]/mL (ref 0.450–4.500)

## 2020-06-01 LAB — T3: T3, Total: 115 ng/dL (ref 71–180)

## 2020-06-01 LAB — VITAMIN B12: Vitamin B-12: 299 pg/mL (ref 232–1245)

## 2020-06-01 NOTE — Progress Notes (Signed)
Dear Dr. Katrinka Blazing,   Thank you for referring Ray Case to our clinic. The following note includes my evaluation and treatment recommendations.  Chief Complaint:   OBESITY Ray Case (MR# [id number]) is a 55 y.o. male who presents for evaluation and treatment of obesity and related comorbidities. Current BMI is Body mass index is 30.27 kg/m. Ray Case has been struggling with his weight for many years and has been unsuccessful in either losing weight, maintaining weight loss, or reaching his healthy weight goal.  Ray Case is currently in the action stage of change and ready to dedicate time achieving and maintaining a healthier weight. Ray Case is interested in becoming our patient and working on intensive lifestyle modifications including (but not limited to) diet and exercise for weight loss.  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  He has a partner named Ray Case, Ray Case.  He craves sweets, cakes, and cookies.  He eats out 5 days per week.  His worst habits are late night sweets and excess cheese.  Ray Case's habits were reviewed today and are as follows: His family eats meals together, he thinks his family will eat healthier with him, his desired weight loss is 43 pounds, he started gaining weight in 2017, his heaviest weight ever was 233 pounds, he craves sweets, cake, cookies, he snacks frequently in the evenings, he skips meals frequently and he struggles with emotional eating.  Depression Screen Ray Case's Food and Mood (modified PHQ-9) score was 4.  Depression screen PHQ 2/9 05/31/2020  Decreased Interest 1  Down, Depressed, Hopeless 1  PHQ - 2 Score 2  Altered sleeping 0  Tired, decreased energy 1  Change in appetite 1  Feeling bad or failure about yourself  0  Trouble concentrating 0  Moving slowly or fidgety/restless 0  Suicidal thoughts 0  PHQ-9 Score 4  Difficult doing work/chores Very difficult   Assessment/Plan:   1.  Other fatigue Ray Case admits to daytime somnolence and denies waking up still tired. Patent has a history of symptoms of daytime fatigue. Ray Case generally gets 5 or 6 hours of sleep per night, and states that he has generally restful sleep. Snoring is not present. Apneic episodes are not present. Epworth Sleepiness Score is 8.  Ray Case does feel that his weight is causing his energy to be lower than it should be. Fatigue may be related to obesity, depression or many other causes. Labs will be ordered, and in the meanwhile, Ray Case will focus on self care including making healthy food choices, increasing physical activity and focusing on stress reduction.  - EKG 12-Lead - Vitamin B12 - CBC with Differential/Platelet - Comprehensive metabolic panel - Folate - Hemoglobin A1c - Insulin, random - Lipid panel - T3 - T4, free - TSH - VITAMIN D 25 Hydroxy (Vit-D Deficiency, Fractures)  2. SOB (shortness of breath) on exertion Ray Case notes increasing shortness of breath with exercising and seems to be worsening over time with weight gain. He notes getting out of breath sooner with activity than he used to. This has gotten worse recently. [name] denies shortness of breath at rest or orthopnea.  Ray Case does feel that he gets out of breath more easily that he used to when he exercises. Ray Case's shortness of breath appears to be obesity related and exercise induced. He has agreed to work on weight loss and gradually increase exercise to treat his exercise induced shortness of breath. Will continue to monitor  closely.  - Vitamin B12 - CBC with Differential/Platelet - Comprehensive metabolic panel - Folate - Hemoglobin A1c - Insulin, random - Lipid panel - T3 - T4, free - TSH - VITAMIN D 25 Hydroxy (Vit-D Deficiency, Fractures)  3. Chronic low back pain, unspecified back pain laterality, unspecified whether sciatica present Ray Case is a patient of Dr. Terrilee FilesZach Smith.  He takes Celebrex 100 mg twice daily for  back pain.  Plan:  Continue to follow-up with Dr. Katrinka BlazingSmith.  Will follow because mobility and pain control are important for weight management.  - Vitamin B12 - CBC with Differential/Platelet - Comprehensive metabolic panel - Folate - Hemoglobin A1c - Insulin, random - Lipid panel - T3 - T4, free - TSH - VITAMIN D 25 Hydroxy (Vit-D Deficiency, Fractures)  4. Other hyperlipidemia Course: Not at goal. Lipid-lowering medications: Crestor 20 mg daily.   Plan: Dietary changes: Increase soluble fiber, decrease simple carbohydrates, decrease saturated fat. Exercise changes: Moderate to vigorous-intensity aerobic activity 150 minutes per week or as tolerated. We will continue to monitor along with PCP/specialists as it pertains to his weight loss journey.  Will check labs today.  Lab Results  Component Value Date   CHOL 194 05/31/2020   HDL 56 05/31/2020   LDLCALC 125 (H) 05/31/2020   LDLDIRECT 102.0 09/22/2019   TRIG 72 05/31/2020   CHOLHDL 3.5 05/31/2020   Lab Results  Component Value Date   ALT 24 05/31/2020   Case 20 05/31/2020   ALKPHOS 64 05/31/2020   BILITOT 0.4 05/31/2020   The 10-year ASCVD risk score Denman George(Goff DC Jr., et al., 2013) is: 4.5%   Values used to calculate the score:     Age: [age] years     Sex: Male     Is Non-Hispanic African American: Yes     Diabetic: No     Tobacco smoker: No     Systolic Blood Pressure: 105 mmHg     Is BP treated: No     HDL Cholesterol: 56 mg/dL     Total Cholesterol: 194 mg/dL  - Vitamin [id number] - CBC with Differential/Platelet - Comprehensive metabolic panel - Folate - Hemoglobin A1c - Insulin, random - Lipid panel - T3 - T4, free - TSH - VITAMIN D 25 Hydroxy (Vit-D Deficiency, Fractures)  5. History of substance abuse (HCC) Drugs and ETOH.  None for 1.5 years now.  He uses marijuana.  Plan:  Will monitor.  - Vitamin B12 - CBC with Differential/Platelet - Comprehensive metabolic panel - Folate - Hemoglobin A1c - Insulin,  random - Lipid panel - T3 - T4, free - TSH - VITAMIN D 25 Hydroxy (Vit-D Deficiency, Fractures)  6. GAD (generalized anxiety disorder), with emotional eating Uncontrolled.  Medication: None.  Behavior modification techniques were discussed today to help Ray Case deal with his anxiety.  Orders and follow up as documented in patient record.  He will let me know if he would like to see Dr. Dewaine CongerBarker in the future or not.  7. Depression screening Hughey was screened for depression as part of his new patient workup today.  PHQ-9 is 4.  8. At risk for malnutrition Ray Case was given extensive malnutrition prevention education and counseling today of more than 23 minutes.  Counseled him that malnutrition refers to inappropriate nutrients or not the right balance of nutrients for optimal health.  Discussed with Ray AstDuane Case that it is absolutely possible to be malnourished but yet obese.  Risk factors, including but not limited to, inappropriate dietary choices, difficulty  with obtaining food due to physical or financial limitations, and various physical and mental health conditions were reviewed with Ray Case.   9. Class 1 obesity with serious comorbidity and body mass index (BMI) of 30.0 to 30.9 in adult, unspecified obesity type  Ray Case is currently in the action stage of change and his goal is to continue with weight loss efforts. I recommend Ray Case begin the structured treatment plan as follows:  He has agreed to keeping a food journal and adhering to recommended goals of 1700-1800 calories and 100+ grams of protein.  Exercise goals: As is.   Behavioral modification strategies: increasing lean protein intake, decreasing simple carbohydrates, meal planning and cooking strategies and planning for success.  He was informed of the importance of frequent follow-up visits to maximize his success with intensive lifestyle modifications for his multiple health conditions. He was informed we would discuss his  lab results at his next visit unless there is a critical issue that needs to be addressed sooner. Ray Case agreed to keep his next visit at the agreed upon time to discuss these results.  Objective:   Blood pressure 105/69, pulse 79, temperature 98.7 F (37.1 C), height 5\' 11"  (1.803 m), weight 217 lb (98.4 kg), SpO2 98 %. Body mass index is 30.27 kg/m.  EKG: Normal sinus rhythm, rate 79 bpm.  Indirect Calorimeter completed today shows a VO2 of 305 and a REE of 2122.  His calculated basal metabolic rate is 2123 thus his basal metabolic rate is better than expected.  General: Cooperative, alert, well developed, in no acute distress. HEENT: Conjunctivae and lids unremarkable. Cardiovascular: Regular rhythm.  Lungs: Normal work of breathing. Neurologic: No focal deficits.   Lab Results  Component Value Date   CREATININE 1.01 05/31/2020   BUN 13 05/31/2020   NA 143 05/31/2020   K 4.9 05/31/2020   CL 103 05/31/2020   CO2 24 05/31/2020   Lab Results  Component Value Date   ALT 24 05/31/2020   Case 20 05/31/2020   ALKPHOS 64 05/31/2020   BILITOT 0.4 05/31/2020   Lab Results  Component Value Date   HGBA1C 6.0 (H) 05/31/2020   HGBA1C 5.8 (H) 12/29/2018   HGBA1C 5.9 08/04/2009   Lab Results  Component Value Date   INSULIN 6.0 05/31/2020   Lab Results  Component Value Date   TSH 0.971 05/31/2020   Lab Results  Component Value Date   CHOL 194 05/31/2020   HDL 56 05/31/2020   LDLCALC 125 (H) 05/31/2020   LDLDIRECT 102.0 09/22/2019   TRIG 72 05/31/2020   CHOLHDL 3.5 05/31/2020   Lab Results  Component Value Date   WBC 5.9 05/31/2020   HGB 15.5 05/31/2020   HCT 47.7 05/31/2020   MCV 88 05/31/2020   PLT 185 05/31/2020   Attestation Statements:   This is the patient's first visit at [facility]. The patient's NEW PATIENT PACKET was reviewed at length. Included in the packet: current and past health history, medications, allergies, ROS, gynecologic history  (women only), surgical history, family history, social history, weight history, weight loss surgery history (for those that have had weight loss surgery), nutritional evaluation, mood and food questionnaire, PHQ9, Epworth questionnaire, sleep habits questionnaire, patient life and health improvement goals questionnaire. These will all be scanned into the patient's chart under media.   During the visit, I independently reviewed the patient's EKG, bioimpedance scale results, and indirect calorimeter results. I used this information to tailor a meal plan for the  patient that will help him to lose weight and will improve his obesity-related conditions going forward. I performed a medically necessary appropriate examination and/or evaluation. I discussed the assessment and treatment plan with the patient. The patient was provided an opportunity to ask questions and all were answered. The patient agreed with the plan and demonstrated an understanding of the instructions. Labs were ordered at this visit and will be reviewed at the next visit unless more critical results need to be addressed immediately. Clinical information was updated and documented in the EMR.   I, Insurance claims handler, CMA, am acting as Energy manager for [employer], DO.  I have reviewed the above documentation for accuracy and completeness, and I agree with the above. Carlye Grippe, D.O.  The 21st Century Cures Act was signed into law in 2016 which includes the topic of electronic health records.  This provides immediate access to information in MyChart.  This includes consultation notes, operative notes, office notes, lab results and pathology reports.  If you have any questions about what you read please let us know at your next visit so we can discuss your concerns and take corrective action if need be.  We are right here with you.

## 2020-06-02 ENCOUNTER — Other Ambulatory Visit: Payer: Self-pay | Admitting: Family Medicine

## 2020-06-02 NOTE — Telephone Encounter (Signed)
Left message for patient to call back to confirm if he still needs medication.

## 2020-06-13 ENCOUNTER — Ambulatory Visit (INDEPENDENT_AMBULATORY_CARE_PROVIDER_SITE_OTHER): Payer: BC Managed Care – PPO | Admitting: Family Medicine

## 2020-06-14 ENCOUNTER — Encounter (INDEPENDENT_AMBULATORY_CARE_PROVIDER_SITE_OTHER): Payer: Self-pay | Admitting: Family Medicine

## 2020-06-14 ENCOUNTER — Other Ambulatory Visit: Payer: Self-pay

## 2020-06-14 ENCOUNTER — Ambulatory Visit (INDEPENDENT_AMBULATORY_CARE_PROVIDER_SITE_OTHER): Payer: BC Managed Care – PPO | Admitting: Family Medicine

## 2020-06-14 VITALS — BP 89/61 | HR 73 | Temp 98.0°F | Ht 71.0 in | Wt 219.0 lb

## 2020-06-14 DIAGNOSIS — E669 Obesity, unspecified: Secondary | ICD-10-CM

## 2020-06-14 DIAGNOSIS — E860 Dehydration: Secondary | ICD-10-CM | POA: Diagnosis not present

## 2020-06-14 DIAGNOSIS — E538 Deficiency of other specified B group vitamins: Secondary | ICD-10-CM | POA: Diagnosis not present

## 2020-06-14 DIAGNOSIS — E7849 Other hyperlipidemia: Secondary | ICD-10-CM | POA: Diagnosis not present

## 2020-06-14 DIAGNOSIS — R7303 Prediabetes: Secondary | ICD-10-CM | POA: Diagnosis not present

## 2020-06-14 DIAGNOSIS — Z6830 Body mass index (BMI) 30.0-30.9, adult: Secondary | ICD-10-CM

## 2020-06-14 DIAGNOSIS — E559 Vitamin D deficiency, unspecified: Secondary | ICD-10-CM

## 2020-06-14 MED ORDER — VITAMIN D (ERGOCALCIFEROL) 1.25 MG (50000 UNIT) PO CAPS: 50000.0000 [IU] | ORAL_CAPSULE | ORAL | 0 refills | Status: DC

## 2020-06-15 DIAGNOSIS — E860 Dehydration: Secondary | ICD-10-CM | POA: Insufficient documentation

## 2020-06-15 DIAGNOSIS — R7303 Prediabetes: Secondary | ICD-10-CM | POA: Insufficient documentation

## 2020-06-15 DIAGNOSIS — E538 Deficiency of other specified B group vitamins: Secondary | ICD-10-CM | POA: Insufficient documentation

## 2020-06-15 DIAGNOSIS — E559 Vitamin D deficiency, unspecified: Secondary | ICD-10-CM | POA: Insufficient documentation

## 2020-06-21 NOTE — Progress Notes (Signed)
Chief Complaint:   OBESITY Ray Case is here to discuss his progress with his obesity treatment plan along with follow-up of his obesity related diagnoses.   Today's visit was #: 2 Starting weight: 217 lbs Starting date: 05/31/2020 Today's weight: 219 lbs Today's date: 06/14/2020 Total lbs lost to date: +2 Body mass index is 30.54 kg/m.   Interim History:  Ray Case is here today for his first follow-up office visit since starting the program with [place].  We will be reviewing his NEW Meal Plan and will be discussing all recent labs done here and/ or done at outside facilities.  Extended time was spent counseling Ray Case on all new disease processes that were discovered or that are worsening.    He is following the meal plan with only minor concerns/ questions today.  Patient's meal and food recall appears to be accurate and consistent with what is on the plan when he is following it.  When on plan, his hunger and cravings are well controlled.    Ray Case has not entered foods in his journal.  He says he has no time to weigh or measure lately.  He was traveling a lot over the past 2 weeks.  He has been out of state several times and had several obstacles to overcome the past few weeks.  Plan:  Increase water intake.  Keep a strict food journal.  Current Meal Plan: keeping a food journal and adhering to recommended goals of 1700-1800 calories calories and 100+ grams of protein for 10% of the time.  Current Exercise Plan: Going to the gym for 120 minutes 3 times per week.  Assessment/Plan:    Meds ordered this encounter  Medications  . Vitamin D, Ergocalciferol, (DRISDOL) 1.25 MG (50000 UNIT) CAPS capsule    Sig: Take 1 capsule (50,000 Units total) by mouth every 7 (seven) days.    Dispense:  4 capsule    Refill:  0    1. Other hyperlipidemia Course: Not at goal. Lipid-lowering medications: Crestor 20 mg daily.  He has been taking his Crestor 10% of the time for the past 3-4  months.  Plan:  Worsening.  Discussed labs with patient today.  Dietary changes: Increase soluble fiber, decrease simple carbohydrates, decrease saturated fat. Exercise changes: Moderate to vigorous-intensity aerobic activity 150 minutes per week or as tolerated. We will continue to monitor along with PCP/specialists as it pertains to his weight loss journey.    Lab Results  Component Value Date   CHOL 194 05/31/2020   HDL 56 05/31/2020   LDLCALC 125 (H) 05/31/2020   LDLDIRECT 102.0 09/22/2019   TRIG 72 05/31/2020   CHOLHDL 3.5 05/31/2020   Lab Results  Component Value Date   ALT 24 05/31/2020   AST 20 05/31/2020   ALKPHOS 64 05/31/2020   BILITOT 0.4 05/31/2020   2. Prediabetes Not at goal. Goal is HgbA1c < 5.7.  Medication: None.  Elevated A1c and FI.  Plan:  New.  Discussed labs with patient today.  She wants to try diet/lifestyle changes as first line treatment.  He will continue to focus on protein-rich, low simple carbohydrate foods. We reviewed the importance of hydration, regular exercise for stress reduction, and restorative sleep.   Lab Results  Component Value Date   HGBA1C 6.0 (H) 05/31/2020   Lab Results  Component Value Date   INSULIN 6.0 05/31/2020   3. B12 deficiency Lab Results  Component Value Date   VITAMINB12 299 05/31/2020  Ray Case is not taking a B12 supplement.  Plan:  Discussed labs with patient today.  Increase B12 in meal plan.   4. Mild dehydration Only 16 ounces water per day or less, usually.  Elevated sodium.  Plan:  Discussed labs with patient today.  Discussed water requirements and importance of this for weight loss and his metabolism.  5. Vitamin D deficiency Not at goal. Current vitamin D is 14.9, tested on 05/31/2020. Optimal goal > 50 ng/dL.   Plan: Start to take prescription Vitamin D @50 ,000 IU every week as prescribed.  Follow-up for routine testing of Vitamin D, at least 2-3 times per year to avoid over-replacement.  - Start  Vitamin D, Ergocalciferol, (DRISDOL) 1.25 MG (50000 UNIT) CAPS capsule; Take 1 capsule (50,000 Units total) by mouth every 7 (seven) days.  Dispense: 4 capsule; Refill: 0  6. Class 1 obesity with serious comorbidity and body mass index (BMI) of 30.0 to 30.9 in adult, unspecified obesity type  Course: Ray Case is currently in the action stage of change. As such, his goal is to continue with weight loss efforts.   Nutrition goals: He has agreed to keeping a food journal and adhering to recommended goals of 1700-1800 calories and 120+ grams of protein.   Exercise goals: As is.  Behavioral modification strategies: increasing lean protein intake, increasing water intake, meal planning and cooking strategies, planning for success and keeping a strict food journal.  Future has agreed to follow-up with our clinic in 2 weeks. He was informed of the importance of frequent follow-up visits to maximize his success with intensive lifestyle modifications for his multiple health conditions.   Objective:   Blood pressure (!) 89/61, pulse 73, temperature 98 F (36.7 C), height 5\' 11"  (1.803 m), weight 219 lb (99.3 kg), SpO2 98 %. Body mass index is 30.54 kg/m.  General: Cooperative, alert, well developed, in no acute distress. HEENT: Conjunctivae and lids unremarkable. Cardiovascular: Regular rhythm.  Lungs: Normal work of breathing. Neurologic: No focal deficits.   Lab Results  Component Value Date   CREATININE 1.01 05/31/2020   BUN 13 05/31/2020   NA 143 05/31/2020   K 4.9 05/31/2020   CL 103 05/31/2020   CO2 24 05/31/2020   Lab Results  Component Value Date   ALT 24 05/31/2020   AST 20 05/31/2020   ALKPHOS 64 05/31/2020   BILITOT 0.4 05/31/2020   Lab Results  Component Value Date   HGBA1C 6.0 (H) 05/31/2020   HGBA1C 5.8 (H) 12/29/2018   HGBA1C 5.9 08/04/2009   Lab Results  Component Value Date   INSULIN 6.0 05/31/2020   Lab Results  Component Value Date   TSH 0.971 05/31/2020    Lab Results  Component Value Date   CHOL 194 05/31/2020   HDL 56 05/31/2020   LDLCALC 125 (H) 05/31/2020   LDLDIRECT 102.0 09/22/2019   TRIG 72 05/31/2020   CHOLHDL 3.5 05/31/2020   Lab Results  Component Value Date   WBC 5.9 05/31/2020   HGB 15.5 05/31/2020   HCT 47.7 05/31/2020   MCV 88 05/31/2020   PLT 185 05/31/2020   Attestation Statements:   Reviewed by clinician on day of visit: allergies, medications, problem list, medical history, surgical history, family history, social history, and previous encounter notes.  Time spent on visit including pre-visit chart review and post-visit care and charting was 50 minutes.   I, 07/31/2020, CMA, am acting as 07/31/2020 for Insurance claims handler, DO.  I have reviewed the above documentation  for accuracy and completeness, and I agree with the above. Marjory Sneddon, D.O.  The [place] was signed into law in 2016 which includes the topic of electronic health records.  This provides immediate access to information in MyChart.  This includes consultation notes, operative notes, office notes, lab results and pathology reports.  If you have any questions about what you read please let us know at your next visit so we can discuss your concerns and take corrective action if need be.  We are right here with you.

## 2020-06-25 ENCOUNTER — Encounter: Payer: Self-pay | Admitting: Family Medicine

## 2020-06-28 ENCOUNTER — Ambulatory Visit (INDEPENDENT_AMBULATORY_CARE_PROVIDER_SITE_OTHER): Payer: BC Managed Care – PPO | Admitting: Family Medicine

## 2020-06-28 ENCOUNTER — Encounter (INDEPENDENT_AMBULATORY_CARE_PROVIDER_SITE_OTHER): Payer: Self-pay | Admitting: Family Medicine

## 2020-06-28 ENCOUNTER — Other Ambulatory Visit: Payer: Self-pay

## 2020-06-28 VITALS — BP 111/63 | HR 69 | Temp 97.7°F | Ht 71.0 in | Wt 218.0 lb

## 2020-06-28 DIAGNOSIS — Z9189 Other specified personal risk factors, not elsewhere classified: Secondary | ICD-10-CM | POA: Diagnosis not present

## 2020-06-28 DIAGNOSIS — E669 Obesity, unspecified: Secondary | ICD-10-CM | POA: Diagnosis not present

## 2020-06-28 DIAGNOSIS — Z6830 Body mass index (BMI) 30.0-30.9, adult: Secondary | ICD-10-CM | POA: Diagnosis not present

## 2020-06-28 DIAGNOSIS — E559 Vitamin D deficiency, unspecified: Secondary | ICD-10-CM

## 2020-06-28 DIAGNOSIS — E7849 Other hyperlipidemia: Secondary | ICD-10-CM | POA: Diagnosis not present

## 2020-06-28 MED ORDER — VITAMIN D (ERGOCALCIFEROL) 1.25 MG (50000 UNIT) PO CAPS: 50000.0000 [IU] | ORAL_CAPSULE | ORAL | 0 refills | Status: DC

## 2020-07-09 NOTE — Progress Notes (Signed)
Chief Complaint:   OBESITY Ray Case is here to discuss his progress with his obesity treatment plan along with follow-up of his obesity related diagnoses.   Today's visit was #: 3 Starting weight: 217 lbs Starting date: 05/31/2020 Today's weight: 218 lbs Today's date: 06/28/2020 Total lbs lost to date: +1 Body mass index is 30.4 kg/m.   Interim History:  Ray Case says that there is too much protein to eat on the plan.  He is a picky eater with intolerances.  He reports being under too much stress at work and with life.  He says he was unable to journal.  Unable to commit to meal planning and prepping.  He feels than in 1-1.5 months from now things will be different.  Plan:  Declines changing to a category plan or low carb plan today.  Current Meal Plan: keeping a food journal and adhering to recommended goals of 1700-1800 calories and 120 grams of protein for 50% of the time.  Current Exercise Plan: Going to the gym for 90 minutes 3 times per week.  Assessment/Plan:   No orders of the defined types were placed in this encounter.   Medications Discontinued During This Encounter  Medication Reason  . Vitamin D, Ergocalciferol, (DRISDOL) 1.25 MG (50000 UNIT) CAPS capsule Reorder     Meds ordered this encounter  Medications  . Vitamin D, Ergocalciferol, (DRISDOL) 1.25 MG (50000 UNIT) CAPS capsule    Sig: Take 1 capsule (50,000 Units total) by mouth every 7 (seven) days.    Dispense:  4 capsule    Refill:  0     1. Vitamin D deficiency Not at goal. Current vitamin D is 14.9, tested on 05/31/2020. Optimal goal > 50 ng/dL.  He is taking vitamin D 50,000 IU weekly.  Plan: Continue to take prescription Vitamin D @50 ,000 IU every week as prescribed.  Follow-up for routine testing of Vitamin D, at least 2-3 times per year to avoid over-replacement.  We discussed why we only provide a 1 month supply of medication.  - Refill Vitamin D, Ergocalciferol, (DRISDOL) 1.25 MG (50000 UNIT) CAPS  capsule; Take 1 capsule (50,000 Units total) by mouth every 7 (seven) days.  Dispense: 4 capsule; Refill: 0  2. Other hyperlipidemia Course: Uncontrolled.  Lipid-lowering medications: Crestor 20 mg daily.  Back to taking Crestor regularly.  Tolerating well, without side effects.  Plan: Dietary changes: Increase soluble fiber, decrease simple carbohydrates, decrease saturated/trans fat. Exercise changes: Moderate to vigorous-intensity aerobic activity 150 minutes per week or as tolerated. We will continue to monitor along with PCP/specialists as it pertains to his weight loss journey.  Hydrate.  Continue medications per PCP and engage in healthy lifestyle changes.    Lab Results  Component Value Date   CHOL 194 05/31/2020   HDL 56 05/31/2020   LDLCALC 125 (H) 05/31/2020   LDLDIRECT 102.0 09/22/2019   TRIG 72 05/31/2020   CHOLHDL 3.5 05/31/2020   Lab Results  Component Value Date   ALT 24 05/31/2020   Case 20 05/31/2020   ALKPHOS 64 05/31/2020   BILITOT 0.4 05/31/2020   The 10-year ASCVD risk score 07/31/2020 DC Jr., et al., 2013) is: 5%   Values used to calculate the score:     Age: 55 years     Sex: Male     Is Non-Hispanic African American: Yes     Diabetic: No     Tobacco smoker: No     Systolic Blood Pressure: 111 mmHg  Is BP treated: No     HDL Cholesterol: 56 mg/dL     Total Cholesterol: 194 mg/dL  3. At risk for malnutrition Ray Case was given extensive malnutrition prevention education and counseling today of more than 9 minutes.  Counseled him that malnutrition refers to inappropriate nutrients or not the right balance of nutrients for optimal health.  Discussed with Ray Case that it is absolutely possible to be malnourished but yet obese.  Risk factors, including but not limited to, inappropriate dietary choices, difficulty with obtaining food due to physical or financial limitations, and various physical and mental health conditions were reviewed with Ray Case.   4.  Obesity, current BMI 30.5  Course: Osa is currently in the action stage of change. As such, his goal is to continue with weight loss efforts.   Nutrition goals: He has agreed to practicing portion control and making smarter food choices, such as increasing vegetables and decreasing simple carbohydrates.   Exercise goals: As is.  Behavioral modification strategies: decreasing simple carbohydrates, decreasing eating out, keeping healthy foods in the home and decreasing junk food.  Ray Case has agreed to follow-up with our clinic in 4 weeks. He was informed of the importance of frequent follow-up visits to maximize his success with intensive lifestyle modifications for his multiple health conditions.   Objective:   Blood pressure 111/63, pulse 69, temperature 97.7 F (36.5 C), height 5\' 11"  (1.803 m), weight 218 lb (98.9 kg), SpO2 98 %. Body mass index is 30.4 kg/m.  General: Cooperative, alert, well developed, in no acute distress. HEENT: Conjunctivae and lids unremarkable. Cardiovascular: Regular rhythm.  Lungs: Normal work of breathing. Neurologic: No focal deficits.   Lab Results  Component Value Date   CREATININE 1.01 05/31/2020   BUN 13 05/31/2020   NA 143 05/31/2020   K 4.9 05/31/2020   CL 103 05/31/2020   CO2 24 05/31/2020   Lab Results  Component Value Date   ALT 24 05/31/2020   Case 20 05/31/2020   ALKPHOS 64 05/31/2020   BILITOT 0.4 05/31/2020   Lab Results  Component Value Date   HGBA1C 6.0 (H) 05/31/2020   HGBA1C 5.8 (H) 12/29/2018   HGBA1C 5.9 08/04/2009   Lab Results  Component Value Date   INSULIN 6.0 05/31/2020   Lab Results  Component Value Date   TSH 0.971 05/31/2020   Lab Results  Component Value Date   CHOL 194 05/31/2020   HDL 56 05/31/2020   LDLCALC 125 (H) 05/31/2020   LDLDIRECT 102.0 09/22/2019   TRIG 72 05/31/2020   CHOLHDL 3.5 05/31/2020   Lab Results  Component Value Date   WBC 5.9 05/31/2020   HGB 15.5 05/31/2020   HCT 47.7  05/31/2020   MCV 88 05/31/2020   PLT 185 05/31/2020   Attestation Statements:   Reviewed by clinician on day of visit: allergies, medications, problem list, medical history, surgical history, family history, social history, and previous encounter notes.  I, 07/31/2020, CMA, am acting as Insurance claims handler for Energy manager, DO.  I have reviewed the above documentation for accuracy and completeness, and I agree with the above. [employer], D.O.  The 21st Century Cures Act was signed into law in 2016 which includes the topic of electronic health records.  This provides immediate access to information in MyChart.  This includes consultation notes, operative notes, office notes, lab results and pathology reports.  If you have any questions about what you read please let 2017 know at your  next visit so we can discuss your concerns and take corrective action if need be.  We are right here with you.

## 2020-07-11 ENCOUNTER — Encounter: Payer: Self-pay | Admitting: Family Medicine

## 2020-07-11 DIAGNOSIS — M545 Low back pain, unspecified: Secondary | ICD-10-CM

## 2020-07-11 DIAGNOSIS — G8929 Other chronic pain: Secondary | ICD-10-CM

## 2020-07-12 MED ORDER — PREDNISONE 10 MG (21) PO TBPK: ORAL_TABLET | ORAL | 0 refills | Status: DC

## 2020-07-27 ENCOUNTER — Other Ambulatory Visit: Payer: Self-pay | Admitting: Family Medicine

## 2020-07-27 DIAGNOSIS — E7800 Pure hypercholesterolemia, unspecified: Secondary | ICD-10-CM

## 2020-07-31 ENCOUNTER — Other Ambulatory Visit: Payer: Self-pay

## 2020-07-31 ENCOUNTER — Ambulatory Visit (INDEPENDENT_AMBULATORY_CARE_PROVIDER_SITE_OTHER): Payer: BC Managed Care – PPO | Admitting: Family Medicine

## 2020-07-31 ENCOUNTER — Encounter (INDEPENDENT_AMBULATORY_CARE_PROVIDER_SITE_OTHER): Payer: Self-pay | Admitting: Family Medicine

## 2020-07-31 VITALS — BP 112/63 | HR 59 | Temp 98.3°F | Ht 71.0 in | Wt 221.0 lb

## 2020-07-31 DIAGNOSIS — Z9189 Other specified personal risk factors, not elsewhere classified: Secondary | ICD-10-CM | POA: Diagnosis not present

## 2020-07-31 DIAGNOSIS — E669 Obesity, unspecified: Secondary | ICD-10-CM | POA: Diagnosis not present

## 2020-07-31 DIAGNOSIS — R7303 Prediabetes: Secondary | ICD-10-CM | POA: Diagnosis not present

## 2020-07-31 DIAGNOSIS — E559 Vitamin D deficiency, unspecified: Secondary | ICD-10-CM

## 2020-07-31 DIAGNOSIS — Z6830 Body mass index (BMI) 30.0-30.9, adult: Secondary | ICD-10-CM

## 2020-07-31 MED ORDER — VITAMIN D (ERGOCALCIFEROL) 1.25 MG (50000 UNIT) PO CAPS: 50000.0000 [IU] | ORAL_CAPSULE | ORAL | 0 refills | Status: AC

## 2020-08-08 NOTE — Progress Notes (Signed)
Chief Complaint:   OBESITY Ray Case is here to discuss his progress with his obesity treatment plan along with follow-up of his obesity related diagnoses.   Today's visit was #: 4 Starting weight: 217 lbs Starting date: 05/31/2020 Today's weight: 221 lbs Today's date: 07/31/2020 Total lbs lost to date: 4 lbs Body mass index is 30.82 kg/m.   Interim History:  Ray Case has been drinking more water and going to the gym.  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  Current Meal Plan: practicing portion control and making smarter food choices, such as increasing vegetables and decreasing simple carbohydrates for 0% of the time.  Current Exercise Plan: Going to the gym for 45-60 minutes 3 times per week.  Assessment/Plan:   1. Vitamin D deficiency Not at goal. Current vitamin D is 14.9, tested on 05/31/2020. Optimal goal > 50 ng/dL.  She is taking vitamin D 50,000 IU weekly.  Plan: Continue to take prescription Vitamin D @50 ,000 IU every week as prescribed.  Follow-up for routine testing of Vitamin D, at least 2-3 times per year to avoid over-replacement.  - Refill Vitamin D, Ergocalciferol, (DRISDOL) 1.25 MG (50000 UNIT) CAPS capsule; Take 1 capsule (50,000 Units total) by mouth every 7 (seven) days.  Dispense: 4 capsule; Refill: 0  2. Prediabetes Not at goal. Goal is HgbA1c < 5.7.  Medication: None.  A1c 6.0 on 05/31/2020.  Plan:  He will continue to focus on protein-rich, low simple carbohydrate foods. We reviewed the importance of hydration, regular exercise for stress reduction, and restorative sleep.  Counseling done.  Reviewed the importance of prudent nutritional plan and weight loss to prevent diabetes mellitus.  Lab Results  Component Value Date   HGBA1C 6.0 (H) 05/31/2020   Lab Results  Component Value Date   INSULIN 6.0 05/31/2020   3. At risk for diabetes mellitus - Ray Case was given diabetes prevention education and counseling  today of more than 9 minutes.  - Counseled patient on pathophysiology of disease and meaning/ implication of lab results.  - Reviewed how certain foods can either stimulate or inhibit insulin release, and subsequently affect hunger pathways  - Importance of following a healthy meal plan with limiting amounts of simple carbohydrates discussed with patient - Effects of regular aerobic exercise on blood sugar regulation reviewed and encouraged an eventual goal of 30 min 5d/week or more as a minimum.  - Briefly discussed treatment options, which always include dietary and lifestyle modification as first line.   - Handouts provided at patient's desire and/or told to go online to the American Diabetes Association website for further information.  4. Obesity, current BMI 30.9  Course: Ray Case is currently in the action stage of change. As such, his goal is to continue with weight loss efforts.   Nutrition goals: He has agreed to keeping a food journal and adhering to recommended goals of 1700-1800 calories and 100+ grams of protein.   Exercise goals: For substantial health benefits, adults should do at least 150 minutes (2 hours and 30 minutes) a week of moderate-intensity, or 75 minutes (1 hour and 15 minutes) a week of vigorous-intensity aerobic physical activity, or an equivalent combination of moderate- and vigorous-intensity aerobic activity. Aerobic activity should be performed in episodes of at least 10 minutes, and preferably, it should be spread throughout the week.  Behavioral modification strategies: better snacking choices, planning for success and keeping a strict food journal.  07/31/2020  has agreed to follow-up with our clinic in 3-4 weeks, per patient request. He was informed of the importance of frequent follow-up visits to maximize his success with intensive lifestyle modifications for his multiple health conditions.   Objective:   Blood pressure 112/63, pulse (!) 59, temperature 98.3 F  (36.8 C), height 5\' 11"  (1.803 m), weight 221 lb (100.2 kg), SpO2 96 %. Body mass index is 30.82 kg/m.  General: Cooperative, alert, well developed, in no acute distress. HEENT: Conjunctivae and lids unremarkable. Cardiovascular: Regular rhythm.  Lungs: Normal work of breathing. Neurologic: No focal deficits.   Lab Results  Component Value Date   CREATININE 1.01 05/31/2020   BUN 13 05/31/2020   NA 143 05/31/2020   K 4.9 05/31/2020   CL 103 05/31/2020   CO2 24 05/31/2020   Lab Results  Component Value Date   ALT 24 05/31/2020   AST 20 05/31/2020   ALKPHOS 64 05/31/2020   BILITOT 0.4 05/31/2020   Lab Results  Component Value Date   HGBA1C 6.0 (H) 05/31/2020   HGBA1C 5.8 (H) 12/29/2018   HGBA1C 5.9 08/04/2009   Lab Results  Component Value Date   INSULIN 6.0 05/31/2020   Lab Results  Component Value Date   TSH 0.971 05/31/2020   Lab Results  Component Value Date   CHOL 194 05/31/2020   HDL 56 05/31/2020   LDLCALC 125 (H) 05/31/2020   LDLDIRECT 102.0 09/22/2019   TRIG 72 05/31/2020   CHOLHDL 3.5 05/31/2020   Lab Results  Component Value Date   WBC 5.9 05/31/2020   HGB 15.5 05/31/2020   HCT 47.7 05/31/2020   MCV 88 05/31/2020   PLT 185 05/31/2020   Attestation Statements:   Reviewed by clinician on day of visit: allergies, medications, problem list, medical history, surgical history, family history, social history, and previous encounter notes.  I, 07/31/2020, CMA, am acting as Insurance claims handler for Energy manager, DO.  I have reviewed the above documentation for accuracy and completeness, and I agree with the above. [employer], D.O.  The 21st Century Cures Act was signed into law in 2016 which includes the topic of electronic health records.  This provides immediate access to information in MyChart.  This includes consultation notes, operative notes, office notes, lab results and pathology reports.  If you have any questions about what you read  please let 2017 know at your next visit so we can discuss your concerns and take corrective action if need be.  We are right here with you.

## 2020-08-23 ENCOUNTER — Ambulatory Visit (INDEPENDENT_AMBULATORY_CARE_PROVIDER_SITE_OTHER): Payer: BC Managed Care – PPO | Admitting: Family Medicine

## 2020-09-20 ENCOUNTER — Ambulatory Visit (INDEPENDENT_AMBULATORY_CARE_PROVIDER_SITE_OTHER): Payer: BC Managed Care – PPO | Admitting: Family Medicine

## 2020-10-25 ENCOUNTER — Other Ambulatory Visit: Payer: Self-pay

## 2020-10-26 ENCOUNTER — Other Ambulatory Visit: Payer: Self-pay | Admitting: Family Medicine

## 2020-10-26 DIAGNOSIS — E7800 Pure hypercholesterolemia, unspecified: Secondary | ICD-10-CM

## 2020-10-27 ENCOUNTER — Other Ambulatory Visit: Payer: Self-pay

## 2020-10-27 ENCOUNTER — Encounter: Payer: Self-pay | Admitting: Family Medicine

## 2020-10-27 ENCOUNTER — Ambulatory Visit: Payer: BC Managed Care – PPO | Admitting: Family Medicine

## 2020-10-27 VITALS — BP 122/78 | HR 91 | Temp 97.1°F | Ht 71.0 in | Wt 211.2 lb

## 2020-10-27 DIAGNOSIS — M5416 Radiculopathy, lumbar region: Secondary | ICD-10-CM

## 2020-10-27 MED ORDER — PREDNISONE 10 MG (48) PO TBPK: ORAL_TABLET | ORAL | 0 refills | Status: AC

## 2020-10-27 MED ORDER — TRAMADOL HCL 50 MG PO TABS
50.0000 mg | ORAL_TABLET | Freq: Three times a day (TID) | ORAL | 0 refills | Status: AC | PRN
Start: 2020-10-27 — End: 2020-11-01

## 2020-10-27 MED ORDER — METHOCARBAMOL 500 MG PO TABS: 500.0000 mg | ORAL_TABLET | Freq: Three times a day (TID) | ORAL | 1 refills | Status: AC | PRN

## 2020-10-27 NOTE — Progress Notes (Signed)
Established Patient Office Visit  Subjective:  Patient ID: Ray Case, male    DOB: [birth year]  Age: 55 y.o. MRN: [id number]  CC:  Chief Complaint  Patient presents with   Back Pain    C/O back, knee and thigh pains possibly due to moving patient would like a Rx for prednisone to help aleveate  some of the pain    HPI Ray Case presents for lower back pain, thigh pain.  This seemed to start after moving boxes in preparation for his moved to [facility].  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  At this point the pain is nonradiating, there are no paresthesias or weakness.   Past Medical History:  Diagnosis Date   Achilles tendon rupture 2014   Left   Alcohol abuse    not anymore, in my past   Anxiety    Back pain    Complication of anesthesia    Pt had previouds experience of feeling pain after local anesthesia   Drug use    not anymore in my past   Headache(784.0)    High cholesterol    Hyperlipidemia    SOBOE (shortness of breath on exertion)    Tuberculosis    Pt exposed and medicated  1990's    Past Surgical History:  Procedure Laterality Date   ACHILLES TENDON SURGERY Left 02/24/2013   Procedure: LEFT ACHILLES TENDON REPAIR;  Surgeon: Marianna Payment, MD;  Location: [facility];  Service: Orthopedics;  Laterality: Left;   FRACTURE SURGERY Right     arm, metal pin    Family History  Problem Relation Age of Onset   Cancer - Lung Mother        Mother died of lung cancer at age 75 (smoker)   Cancer Mother    Obesity Mother    Hypertension Father        Father died in his 9s secondary to aortic aneurysm. He has history of stroke and was a smoker   AAA (abdominal aortic aneurysm) Father    Heart disease Father    Sudden death Father    Stroke Father    Kidney disease Father    Drug abuse Father    Diverticulosis Sister    Schizophrenia Cousin        and nephew   Obesity Sister        Sister died at age 32  secondary to complications of morbid obesity    Social History   Socioeconomic History   Marital status: Soil scientist    Spouse name: Not on file   Number of children: 0   Years of education: Not on file   Highest education level: Not on file  Occupational History   Occupation: professor    Employer: [employer]  Tobacco Use   Smoking status: Former    Packs/day: 0.25    Years: 8.00    Pack years: 2.00    Types: Cigarettes    Quit date: 12/27/2013    Years since quitting: 6.8   Smokeless tobacco: Never   Tobacco comments:    pt goes in and out trying to quit  Substance and Sexual Activity   Alcohol use: Not Currently    Comment: None since 9/20.    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   One caffeinated beverage daily   Social Determinants of Health   Financial Resource Strain: Not on file  Food Insecurity: Not on file  Transportation Needs: Not on file  Physical Activity: Not on file  Stress: Not on file  Social Connections: Not on file  Intimate Partner Violence: Not on file    Outpatient Medications Prior to Visit  Medication Sig Dispense Refill   rosuvastatin (CRESTOR) 20 MG tablet TAKE 1 TABLET BY MOUTH EVERY DAY 90 tablet 0   celecoxib (CELEBREX) 100 MG capsule Take 1 capsule (100 mg total) by mouth 2 (two) times daily. (Patient not taking: Reported on 10/27/2020) 60 capsule 3   Vitamin D, Ergocalciferol, (DRISDOL) 1.25 MG (50000 UNIT) CAPS capsule Take 1 capsule (50,000 Units total) by mouth every 7 (seven) days. (Patient not taking: Reported on 10/27/2020) 4 capsule 0   No facility-administered medications prior to visit.    No Known Allergies  ROS Review of Systems  Constitutional:  Negative for diaphoresis, fatigue, fever and unexpected weight change.  Respiratory: Negative.    Cardiovascular: Negative.    Gastrointestinal: Negative.   Musculoskeletal:  Positive for back pain.  Neurological:  Negative for weakness and numbness.  Psychiatric/Behavioral: Negative.       Objective:    Physical Exam Vitals and nursing note reviewed.  Constitutional:      General: He is not in acute distress.    Appearance: Normal appearance. He is normal weight. He is not ill-appearing, toxic-appearing or diaphoretic.  Eyes:     General: No scleral icterus.       Right eye: No discharge.        Left eye: No discharge.     Extraocular Movements: Extraocular movements intact.     Conjunctiva/sclera: Conjunctivae normal.  Pulmonary:     Effort: Pulmonary effort is normal.  Musculoskeletal:     Lumbar back: No deformity or tenderness. Normal range of motion.     Right hip: Normal range of motion.     Left hip: Normal. Normal range of motion.       Legs:  Neurological:     Mental Status: He is alert.     Motor: Motor function is intact. No weakness.    BP 122/78 (BP Location: Right Arm, Patient Position: Sitting, Cuff Size: Normal)   Pulse 91   Temp (!) 97.1 F (36.2 C) (Temporal)   Ht _0  (1.803 m)   Wt 211 lb 3.2 oz (95.8 kg)   SpO2 95%   BMI 29.46 kg/m  Wt Readings from Last 3 Encounters:  10/27/20 211 lb 3.2 oz (95.8 kg)  07/31/20 221 lb (100.2 kg)  06/28/20 218 lb (98.9 kg)     Health Maintenance Due  Topic Date Due   Pneumococcal Vaccine 69-41 Years old (1 - PCV) Never done   TETANUS/TDAP  Never done   Zoster Vaccines- Shingrix (1 of 2) Never done    There are no preventive care reminders to display for this patient.  Lab Results  Component Value Date   TSH 0.971 05/31/2020   Lab Results  Component Value Date   WBC 5.9 05/31/2020   HGB 15.5 05/31/2020   HCT 47.7 05/31/2020   MCV 88 05/31/2020   PLT 185 05/31/2020   Lab Results  Component Value Date   NA 143 05/31/2020   K 4.9 05/31/2020   CO2 24 05/31/2020   GLUCOSE 94 05/31/2020   BUN 13 05/31/2020  CREATININE 1.01 05/31/2020   BILITOT 0.4 05/31/2020   ALKPHOS 64 05/31/2020   AST 20 05/31/2020   ALT 24 05/31/2020   PROT 6.9 05/31/2020   ALBUMIN 4.5 05/31/2020   CALCIUM 9.7 05/31/2020   ANIONGAP 8 12/30/2018   EGFR 88 05/31/2020   GFR 97.20 09/22/2019   Lab Results  Component Value Date   CHOL 194 05/31/2020   Lab Results  Component Value Date   HDL 56 05/31/2020   Lab Results  Component Value Date   LDLCALC 125 (H) 05/31/2020   Lab Results  Component Value Date   TRIG 72 05/31/2020   Lab Results  Component Value Date   CHOLHDL 3.5 05/31/2020   Lab Results  Component Value Date   HGBA1C 6.0 (H) 05/31/2020      Assessment & Plan:   Problem List Items Addressed This Visit   None Visit Diagnoses     Lumbar radiculopathy    -  Primary   Relevant Medications   predniSONE (STERAPRED UNI-PAK 48 TAB) 10 MG (48) TBPK tablet   methocarbamol (ROBAXIN) 500 MG tablet   traMADol (ULTRAM) 50 MG tablet       Meds ordered this encounter  Medications   predniSONE (STERAPRED UNI-PAK 48 TAB) 10 MG (48) TBPK tablet    Sig: Please instruct 12 day taper.    Dispense:  48 tablet    Refill:  0   methocarbamol (ROBAXIN) 500 MG tablet    Sig: Take 1 tablet (500 mg total) by mouth every 8 (eight) hours as needed for muscle spasms.    Dispense:  40 tablet    Refill:  1   traMADol (ULTRAM) 50 MG tablet    Sig: Take 1 tablet (50 mg total) by mouth every 8 (eight) hours as needed for up to 5 days.    Dispense:  15 tablet    Refill:  0    Follow-up: No follow-ups on file.  Patient is moving to [place] on the eighth.  He will be driving.  He will be stopping frequently to stretch.  Warned him about increased appetite and irritability on the 12-day Dosepak.  Use Robaxin as needed.  Brief course of tramadol for more severe pain.  He will follow-up with the back specialist as soon as he is able in his new town.  Libby Maw, MD

## 2020-10-30 ENCOUNTER — Encounter: Payer: Self-pay | Admitting: Family Medicine

## 2020-11-10 ENCOUNTER — Encounter: Payer: Self-pay | Admitting: Family Medicine

## 2020-11-13 ENCOUNTER — Encounter: Payer: Self-pay | Admitting: Family Medicine

## 2021-01-23 ENCOUNTER — Other Ambulatory Visit: Payer: Self-pay | Admitting: Family Medicine

## 2021-01-23 DIAGNOSIS — E7800 Pure hypercholesterolemia, unspecified: Secondary | ICD-10-CM

## 2021-04-28 ENCOUNTER — Other Ambulatory Visit: Payer: Self-pay | Admitting: Family Medicine

## 2021-04-28 DIAGNOSIS — E7800 Pure hypercholesterolemia, unspecified: Secondary | ICD-10-CM

## 2021-11-07 ENCOUNTER — Encounter (INDEPENDENT_AMBULATORY_CARE_PROVIDER_SITE_OTHER): Payer: Self-pay

## 2023-07-07 ENCOUNTER — Encounter: Payer: Self-pay | Admitting: Internal Medicine
